# Patient Record
Sex: Male | Born: 1945 | Race: White | Hispanic: No | Smoking: Current every day smoker
Health system: Southern US, Community
[De-identification: ages and names within clinical notes are randomized; demographics above are authoritative.]

## PROBLEM LIST (undated history)

## (undated) DIAGNOSIS — E785 Hyperlipidemia, unspecified: Secondary | ICD-10-CM

## (undated) DIAGNOSIS — Z720 Tobacco use: Secondary | ICD-10-CM

## (undated) DIAGNOSIS — K219 Gastro-esophageal reflux disease without esophagitis: Secondary | ICD-10-CM

## (undated) DIAGNOSIS — R0600 Dyspnea, unspecified: Secondary | ICD-10-CM

## (undated) DIAGNOSIS — F4310 Post-traumatic stress disorder, unspecified: Secondary | ICD-10-CM

## (undated) DIAGNOSIS — R0602 Shortness of breath: Secondary | ICD-10-CM

## (undated) DIAGNOSIS — R011 Cardiac murmur, unspecified: Secondary | ICD-10-CM

## (undated) DIAGNOSIS — M1990 Unspecified osteoarthritis, unspecified site: Secondary | ICD-10-CM

## (undated) DIAGNOSIS — G8929 Other chronic pain: Secondary | ICD-10-CM

## (undated) DIAGNOSIS — J449 Chronic obstructive pulmonary disease, unspecified: Secondary | ICD-10-CM

## (undated) DIAGNOSIS — Z951 Presence of aortocoronary bypass graft: Secondary | ICD-10-CM

## (undated) DIAGNOSIS — I10 Essential (primary) hypertension: Secondary | ICD-10-CM

## (undated) DIAGNOSIS — Z953 Presence of xenogenic heart valve: Secondary | ICD-10-CM

## (undated) DIAGNOSIS — I700 Atherosclerosis of aorta: Secondary | ICD-10-CM

## (undated) DIAGNOSIS — D649 Anemia, unspecified: Secondary | ICD-10-CM

## (undated) HISTORY — PX: SHOULDER ARTHROSCOPY: SHX128

## (undated) HISTORY — DX: Atherosclerosis of aorta: I70.0

## (undated) HISTORY — PX: HERNIA REPAIR: SHX51

## (undated) HISTORY — DX: Tobacco use: Z72.0

## (undated) HISTORY — DX: Hyperlipidemia, unspecified: E78.5

## (undated) HISTORY — DX: Post-traumatic stress disorder, unspecified: F43.10

## (undated) HISTORY — DX: Other chronic pain: G89.29

## (undated) HISTORY — PX: ADENOIDECTOMY: SUR15

## (undated) HISTORY — PX: TONSILLECTOMY: SUR1361

## (undated) HISTORY — DX: Shortness of breath: R06.02

## (undated) HISTORY — PX: OTHER SURGICAL HISTORY: SHX169

## (undated) HISTORY — DX: Cardiac murmur, unspecified: R01.1

---

## 2008-03-18 ENCOUNTER — Ambulatory Visit (HOSPITAL_COMMUNITY): Admission: RE | Admit: 2008-03-18 | Discharge: 2008-03-18 | Payer: Self-pay | Admitting: Neurology

## 2016-02-13 DIAGNOSIS — E785 Hyperlipidemia, unspecified: Secondary | ICD-10-CM | POA: Diagnosis not present

## 2016-02-13 DIAGNOSIS — R011 Cardiac murmur, unspecified: Secondary | ICD-10-CM | POA: Diagnosis not present

## 2016-02-13 DIAGNOSIS — D649 Anemia, unspecified: Secondary | ICD-10-CM | POA: Diagnosis not present

## 2016-02-13 DIAGNOSIS — R0602 Shortness of breath: Secondary | ICD-10-CM | POA: Diagnosis not present

## 2016-02-13 DIAGNOSIS — Z720 Tobacco use: Secondary | ICD-10-CM | POA: Diagnosis not present

## 2016-03-14 ENCOUNTER — Other Ambulatory Visit: Payer: Self-pay | Admitting: Physician Assistant

## 2016-03-14 DIAGNOSIS — D509 Iron deficiency anemia, unspecified: Secondary | ICD-10-CM | POA: Diagnosis not present

## 2016-03-14 DIAGNOSIS — R1032 Left lower quadrant pain: Secondary | ICD-10-CM

## 2016-03-14 DIAGNOSIS — K5903 Drug induced constipation: Secondary | ICD-10-CM

## 2016-03-14 DIAGNOSIS — Z1211 Encounter for screening for malignant neoplasm of colon: Secondary | ICD-10-CM | POA: Diagnosis not present

## 2016-03-18 ENCOUNTER — Emergency Department (HOSPITAL_COMMUNITY): Payer: Commercial Managed Care - HMO

## 2016-03-18 ENCOUNTER — Encounter (HOSPITAL_COMMUNITY): Payer: Self-pay | Admitting: Emergency Medicine

## 2016-03-18 ENCOUNTER — Emergency Department (HOSPITAL_COMMUNITY)
Admission: EM | Admit: 2016-03-18 | Discharge: 2016-03-18 | Disposition: A | Discharge status: Home / Self Care | Payer: Commercial Managed Care - HMO | Attending: Emergency Medicine | Admitting: Emergency Medicine

## 2016-03-18 DIAGNOSIS — F17200 Nicotine dependence, unspecified, uncomplicated: Secondary | ICD-10-CM | POA: Insufficient documentation

## 2016-03-18 DIAGNOSIS — Z880 Allergy status to penicillin: Secondary | ICD-10-CM | POA: Diagnosis not present

## 2016-03-18 DIAGNOSIS — R231 Pallor: Secondary | ICD-10-CM | POA: Insufficient documentation

## 2016-03-18 DIAGNOSIS — R0602 Shortness of breath: Secondary | ICD-10-CM | POA: Diagnosis not present

## 2016-03-18 DIAGNOSIS — D649 Anemia, unspecified: Secondary | ICD-10-CM | POA: Diagnosis not present

## 2016-03-18 LAB — CBC
HCT: 24.9 % — ABNORMAL LOW (ref 39.0–52.0)
Hemoglobin: 7.1 g/dL — ABNORMAL LOW (ref 13.0–17.0)
MCH: 20.5 pg — AB (ref 26.0–34.0)
MCHC: 28.5 g/dL — ABNORMAL LOW (ref 30.0–36.0)
MCV: 72 fL — ABNORMAL LOW (ref 78.0–100.0)
PLATELETS: 216 10*3/uL (ref 150–400)
RBC: 3.46 MIL/uL — AB (ref 4.22–5.81)
RDW: 18 % — ABNORMAL HIGH (ref 11.5–15.5)
WBC: 4.7 10*3/uL (ref 4.0–10.5)

## 2016-03-18 LAB — BASIC METABOLIC PANEL
ANION GAP: 13 (ref 5–15)
BUN: <5 mg/dL — ABNORMAL LOW (ref 6–20)
CALCIUM: 9.2 mg/dL (ref 8.9–10.3)
CO2: 24 mmol/L (ref 22–32)
CREATININE: 0.99 mg/dL (ref 0.61–1.24)
Chloride: 106 mmol/L (ref 101–111)
Glucose, Bld: 105 mg/dL — ABNORMAL HIGH (ref 65–99)
Potassium: 3.9 mmol/L (ref 3.5–5.1)
SODIUM: 143 mmol/L (ref 135–145)

## 2016-03-18 LAB — I-STAT TROPONIN, ED: TROPONIN I, POC: 0.01 ng/mL (ref 0.00–0.08)

## 2016-03-18 NOTE — ED Notes (Signed)
Patient alert and oriented at discharge, VS stable.  Patient ambulatory with stable gait.  Patient verbalized understanding of the high importance to follow up with PCP today due to low Hbg.

## 2016-03-18 NOTE — ED Provider Notes (Signed)
CSN: [id number]     Arrival date & time 03/18/16  1030 History   First MD Initiated Contact with Patient 03/18/16 1241     Chief Complaint  Patient presents with  . Shortness of Breath     (Consider location/radiation/quality/duration/timing/severity/associated sxs/prior Treatment) Patient is a 70 y.o. male presenting with shortness of breath.  Shortness of Breath Severity:  Moderate Onset quality:  Sudden Duration:  2 days Timing:  Constant Progression:  Worsening Chronicity:  New Relieved by:  Nothing Worsened by:  Nothing tried Ineffective treatments:  None tried Associated symptoms: no abdominal pain, no chest pain, no fever, no headaches, no rash and no vomiting     70 yo M with a chief complaint of weakness and shortness of breath. This been going on for at least the past couple weeks. Patient has seen his family physician for this. He was diagnosed at that time with anemia was started on iron supplementation. Patient is also had scheduled follow-up with gastroenterology for possible colon cancer. Patient had a Hemoccult that was negative at that time. Has a CT scan as well as an upper GI series later this week. Patient denies any dark stools or blood in stool. Has had some mild stomach bloating. Had an episode last night for he was standing on a ladder and had sudden worsening shortness of breath and had to lay down on the ground and rest for about 5 minutes.  History reviewed. No pertinent past medical history. History reviewed. No pertinent past surgical history. History reviewed. No pertinent family history. Social History  Substance Use Topics  . Smoking status: Current Every Day Smoker  . Smokeless tobacco: None  . Alcohol Use: No    Review of Systems  Constitutional: Negative for fever and chills.  HENT: Negative for congestion and facial swelling.   Eyes: Negative for discharge and visual disturbance.  Respiratory: Positive for shortness of breath.    Cardiovascular: Negative for chest pain and palpitations.  Gastrointestinal: Negative for vomiting, abdominal pain and diarrhea.  Musculoskeletal: Negative for myalgias and arthralgias.  Skin: Negative for color change and rash.  Neurological: Negative for tremors, syncope and headaches.  Psychiatric/Behavioral: Negative for confusion and dysphoric mood.      Allergies  Penicillins  Home Medications   Prior to Admission medications   Not on File   BP 164/70 mmHg  Pulse 72  Temp(Src) 98.4 F (36.9 C) (Oral)  Resp 16  Ht  (1.803 m)  Wt 162 lb (73.483 kg)  BMI 22.60 kg/m2  SpO2 98% Physical Exam  Constitutional: He is oriented to person, place, and time. He appears well-developed and well-nourished.  Marked pallor   HENT:  Head: Normocephalic and atraumatic.  Eyes: EOM are normal. Pupils are equal, round, and reactive to light.  Neck: Normal range of motion. Neck supple. No JVD present.  Cardiovascular: Normal rate and regular rhythm.  Exam reveals no gallop and no friction rub.   No murmur heard. Pulmonary/Chest: No respiratory distress. He has no wheezes.  Abdominal: He exhibits no distension. There is no tenderness. There is no rebound and no guarding.  Musculoskeletal: Normal range of motion.  Neurological: He is alert and oriented to person, place, and time.  Skin: No rash noted. No pallor.  Psychiatric: He has a normal mood and affect. His behavior is normal.  Nursing note and vitals reviewed.   ED Course  Procedures (including critical care time) Labs Review Labs Reviewed  BASIC METABOLIC PANEL - Abnormal; Notable  for the following:    Glucose, Bld 105 (*)    BUN <5 (*)    All other components within normal limits  CBC - Abnormal; Notable for the following:    RBC 3.46 (*)    Hemoglobin 7.1 (*)    HCT 24.9 (*)    MCV 72.0 (*)    MCH 20.5 (*)    MCHC 28.5 (*)    RDW 18.0 (*)    All other components within normal limits  I-STAT TROPOININ, ED     Imaging Review Dg Chest 2 View  03/18/2016  CLINICAL DATA:  Shortness of breath, weakness, arm and jaw pain beginning yesterday. Initial encounter. EXAM: CHEST  2 VIEW COMPARISON:  None. FINDINGS: The lungs are clear. Heart size is normal. No pneumothorax or pleural effusion. No focal bony abnormality. IMPRESSION: Negative chest. Electronically Signed   By: Drusilla Kannerhomas  Dalessio M.D.   On: 03/18/2016 11:21   I have personally reviewed and evaluated these images and lab results as part of my medical decision-making.   EKG Interpretation   Date/Time:  Monday March 18 2016 10:39:02 EDT Ventricular Rate:  75 PR Interval:  134 QRS Duration: 88 QT Interval:  396 QTC Calculation: 442 R Axis:   70 Text Interpretation:  Normal sinus rhythm Nonspecific ST abnormality  Abnormal ECG Sinus rhythm ST-t wave abnormality Abnormal ekg Confirmed by  Gerhard MunchLOCKWOOD, ROBERT  MD ([id number]) on 03/18/2016 10:44:51 AM      MDM   Final diagnoses:  Symptomatic anemia    70 yo M with a chief complaint of weakness. Found to have significant anemia. Discussed with patient that the normal course for this as admission with transfusion of blood products. The patient feels that he would rather continue take iron supplementation and see if this corrects on its own. Discussed with the patient is could take many months. And then he would continue to have symptoms from this. Patient states that he has been recently anemic but is unable to give me an exact value. Refused rectal exam. Again discussed with patient that he likely will need admission with transfusion and suggested that he call his family doctor immediately upon discharge.   1:50 PM:  I have discussed the diagnosis/risks/treatment options with the patient and believe the pt to be eligible for discharge home to follow-up with PCP/GI. We also discussed returning to the ED immediately if new or worsening sx occur. We discussed the sx which are most concerning (e.g., syncope,  sudden worsening chest pain, sob) that necessitate immediate return. Medications administered to the patient during their visit and any new prescriptions provided to the patient are listed below.  Medications given during this visit Medications - No data to display  New Prescriptions   No medications on file    The patient appears reasonably screen and/or stabilized for discharge and I doubt any other medical condition or other [facility] requiring further screening, evaluation, or treatment in the ED at this time prior to discharge.      Melene Planan Wing Gfeller, DO 03/18/16 1351

## 2016-03-18 NOTE — Discharge Instructions (Signed)
Talk to your family doc.  If your hemoglobin has dropped significantly there is a good chance he will ask you to return to be admitted.  Return for any worsening symptoms or passing out.   Anemia, Nonspecific Anemia is a condition in which the concentration of red blood cells or hemoglobin in the blood is below normal. Hemoglobin is a substance in red blood cells that carries oxygen to the tissues of the body. Anemia results in not enough oxygen reaching these tissues.  CAUSES  Common causes of anemia include:   Excessive bleeding. Bleeding may be internal or external. This includes excessive bleeding from periods (in women) or from the intestine.   Poor nutrition.   Chronic kidney, thyroid, and liver disease.  Bone marrow disorders that decrease red blood cell production.  Cancer and treatments for cancer.  HIV, AIDS, and their treatments.  Spleen problems that increase red blood cell destruction.  Blood disorders.  Excess destruction of red blood cells due to infection, medicines, and autoimmune disorders. SIGNS AND SYMPTOMS   Minor weakness.   Dizziness.   Headache.  Palpitations.   Shortness of breath, especially with exercise.   Paleness.  Cold sensitivity.  Indigestion.  Nausea.  Difficulty sleeping.  Difficulty concentrating. Symptoms may occur suddenly or they may develop slowly.  DIAGNOSIS  Additional blood tests are often needed. These help your health care provider determine the best treatment. Your health care provider will check your stool for blood and look for other causes of blood loss.  TREATMENT  Treatment varies depending on the cause of the anemia. Treatment can include:   Supplements of iron, vitamin B12, or folic acid.   Hormone medicines.   A blood transfusion. This may be needed if blood loss is severe.   Hospitalization. This may be needed if there is significant continual blood loss.   Dietary changes.  Spleen  removal. HOME CARE INSTRUCTIONS Keep all follow-up appointments. It often takes many weeks to correct anemia, and having your health care provider check on your condition and your response to treatment is very important. SEEK IMMEDIATE MEDICAL CARE IF:   You develop extreme weakness, shortness of breath, or chest pain.   You become dizzy or have trouble concentrating.  You develop heavy vaginal bleeding.   You develop a rash.   You have bloody or black, tarry stools.   You faint.   You vomit up blood.   You vomit repeatedly.   You have abdominal pain.  You have a fever or persistent symptoms for more than 2-3 days.   You have a fever and your symptoms suddenly get worse.   You are dehydrated.  MAKE SURE YOU:  Understand these instructions.  Will watch your condition.  Will get help right away if you are not doing well or get worse.   This information is not intended to replace advice given to you by your health care provider. Make sure you discuss any questions you have with your health care provider.   Document Released: 01/23/2005 Document Revised: 08/18/2013 Document Reviewed: 06/11/2013 Elsevier Interactive Patient Education [employer]2016 [employer] Inc.

## 2016-03-18 NOTE — ED Notes (Signed)
Pt sts SOB and some leg weakness starting yesterday; pt sts some cough; pt sts feels worse with exertion

## 2016-03-19 ENCOUNTER — Encounter (HOSPITAL_COMMUNITY): Payer: Self-pay | Admitting: Neurology

## 2016-03-19 ENCOUNTER — Observation Stay (HOSPITAL_COMMUNITY): Payer: Commercial Managed Care - HMO

## 2016-03-19 ENCOUNTER — Observation Stay (HOSPITAL_COMMUNITY)
Admission: EM | Admit: 2016-03-19 | Discharge: 2016-03-20 | Disposition: A | Discharge status: Home / Self Care | Payer: Commercial Managed Care - HMO | Attending: Internal Medicine | Admitting: Internal Medicine

## 2016-03-19 DIAGNOSIS — R079 Chest pain, unspecified: Secondary | ICD-10-CM | POA: Diagnosis not present

## 2016-03-19 DIAGNOSIS — Z79899 Other long term (current) drug therapy: Secondary | ICD-10-CM | POA: Diagnosis not present

## 2016-03-19 DIAGNOSIS — N200 Calculus of kidney: Secondary | ICD-10-CM | POA: Diagnosis not present

## 2016-03-19 DIAGNOSIS — R030 Elevated blood-pressure reading, without diagnosis of hypertension: Secondary | ICD-10-CM | POA: Insufficient documentation

## 2016-03-19 DIAGNOSIS — K5909 Other constipation: Secondary | ICD-10-CM | POA: Insufficient documentation

## 2016-03-19 DIAGNOSIS — R1030 Lower abdominal pain, unspecified: Secondary | ICD-10-CM

## 2016-03-19 DIAGNOSIS — F17210 Nicotine dependence, cigarettes, uncomplicated: Secondary | ICD-10-CM | POA: Diagnosis not present

## 2016-03-19 DIAGNOSIS — D649 Anemia, unspecified: Secondary | ICD-10-CM | POA: Diagnosis not present

## 2016-03-19 DIAGNOSIS — R0602 Shortness of breath: Secondary | ICD-10-CM

## 2016-03-19 DIAGNOSIS — R109 Unspecified abdominal pain: Secondary | ICD-10-CM

## 2016-03-19 DIAGNOSIS — R1032 Left lower quadrant pain: Secondary | ICD-10-CM | POA: Diagnosis not present

## 2016-03-19 DIAGNOSIS — I70201 Unspecified atherosclerosis of native arteries of extremities, right leg: Secondary | ICD-10-CM | POA: Insufficient documentation

## 2016-03-19 DIAGNOSIS — I700 Atherosclerosis of aorta: Secondary | ICD-10-CM | POA: Insufficient documentation

## 2016-03-19 DIAGNOSIS — R531 Weakness: Secondary | ICD-10-CM | POA: Diagnosis not present

## 2016-03-19 DIAGNOSIS — D509 Iron deficiency anemia, unspecified: Secondary | ICD-10-CM | POA: Insufficient documentation

## 2016-03-19 LAB — CBC
HCT: 24.2 % — ABNORMAL LOW (ref 39.0–52.0)
Hemoglobin: 6.9 g/dL — CL (ref 13.0–17.0)
MCH: 20.8 pg — AB (ref 26.0–34.0)
MCHC: 28.5 g/dL — AB (ref 30.0–36.0)
MCV: 72.9 fL — ABNORMAL LOW (ref 78.0–100.0)
PLATELETS: 222 10*3/uL (ref 150–400)
RBC: 3.32 MIL/uL — ABNORMAL LOW (ref 4.22–5.81)
RDW: 18.9 % — ABNORMAL HIGH (ref 11.5–15.5)
WBC: 5 10*3/uL (ref 4.0–10.5)

## 2016-03-19 LAB — COMPREHENSIVE METABOLIC PANEL
ALBUMIN: 3.7 g/dL (ref 3.5–5.0)
ALK PHOS: 80 U/L (ref 38–126)
ALT: 17 U/L (ref 17–63)
AST: 23 U/L (ref 15–41)
Anion gap: 10 (ref 5–15)
BUN: <5 mg/dL — ABNORMAL LOW (ref 6–20)
CALCIUM: 9.1 mg/dL (ref 8.9–10.3)
CO2: 25 mmol/L (ref 22–32)
CREATININE: 0.99 mg/dL (ref 0.61–1.24)
Chloride: 109 mmol/L (ref 101–111)
GFR calc Af Amer: >60 mL/min (ref >60)
GFR calc non Af Amer: >60 mL/min (ref >60)
GLUCOSE: 108 mg/dL — AB (ref 65–99)
Potassium: 4.1 mmol/L (ref 3.5–5.1)
SODIUM: 144 mmol/L (ref 135–145)
Total Bilirubin: 0.4 mg/dL (ref 0.3–1.2)
Total Protein: 6.3 g/dL — ABNORMAL LOW (ref 6.5–8.1)

## 2016-03-19 LAB — ABO/RH: ABO/RH(D): O POS

## 2016-03-19 LAB — PREPARE RBC (CROSSMATCH)

## 2016-03-19 LAB — POC OCCULT BLOOD, ED: Fecal Occult Bld: NEGATIVE

## 2016-03-19 MED ORDER — SODIUM CHLORIDE 0.9 % IV SOLN: INTRAVENOUS | Status: DC

## 2016-03-19 MED ORDER — IOHEXOL 300 MG/ML  SOLN
100.0000 mL | Freq: Once | INTRAMUSCULAR | Status: AC | PRN
  Administered 2016-03-19: 100 mL via INTRAVENOUS

## 2016-03-19 MED ORDER — HYDROCODONE-ACETAMINOPHEN 5-325 MG PO TABS: 1.0000 | ORAL_TABLET | Freq: Four times a day (QID) | ORAL | Status: DC | PRN

## 2016-03-19 MED ORDER — SODIUM CHLORIDE 0.9 % IV SOLN: Freq: Once | INTRAVENOUS | Status: DC

## 2016-03-19 MED ORDER — MORPHINE SULFATE (PF) 2 MG/ML IV SOLN: 2.0000 mg | INTRAVENOUS | Status: DC | PRN

## 2016-03-19 NOTE — ED Provider Notes (Signed)
Jonathan Holland: [id number]     Arrival date & time 03/19/16  1252 History   First MD Initiated Contact with Patient 03/19/16 1518     Chief Complaint  Patient presents with  . Abnormal Lab     (Consider location/radiation/quality/duration/timing/severity/associated sxs/prior Treatment) HPI   Jonathan Holland is a 70 y.o. male, patient with no pertinent past medical history, presenting to the ED with intermittent shortness of breath, chest pain, and weakness for the last three days. These symptoms are only present upon exertion. Pt also endorses intermittent hematochezia and LLQ abdominal pain for the last three weeks. Pt currently denies pain. Patient states that he was seen yesterday for the same complaints, was told that he would need to be admitted for blood transfusion, but that he refused at that time. Patient states that he wanted to talk with his PCP about this issue prior to admission, but when his PCP would not call him back, patient decided to return to the ED for treatment. Patient denies fever/chills, nausea/vomiting, LOC, current chest pain or shortness of breath, hematemesis or hemoptysis, weight loss, night sweats, or any other complaints.    History reviewed. No pertinent past medical history. History reviewed. No pertinent past surgical history. No family history on file. Social History  Substance Use Topics  . Smoking status: Current Every Day Smoker  . Smokeless tobacco: None  . Alcohol Use: No    Review of Systems  Constitutional: Negative for fever and chills.  Respiratory: Positive for shortness of breath (exertional).   Cardiovascular: Positive for chest pain (exertional).  Gastrointestinal: Positive for abdominal pain (intermittent, currently resolved) and blood in stool. Negative for nausea, vomiting, diarrhea and constipation.  Genitourinary: Negative for dysuria.  Skin: Positive for pallor.  Neurological: Positive for weakness. Negative for dizziness, syncope,  light-headedness and headaches.  All other systems reviewed and are negative.     Allergies  Penicillins  Home Medications   Prior to Admission medications   Medication Sig Start Date End Date Taking? Authorizing Provider  Aspirin-Salicylamide-Caffeine (BC HEADACHE POWDER PO) Take 1 packet by mouth every 8 (eight) hours as needed (pain).   Yes Historical Provider, MD  ferrous sulfate 325 (65 FE) MG tablet Take 325 mg by mouth daily with breakfast.   Yes Historical Provider, MD  HYDROcodone-acetaminophen (NORCO/VICODIN) 5-325 MG tablet Take 1 tablet by mouth every 6 (six) hours as needed for moderate pain.   Yes Historical Provider, MD  Multiple Vitamin (MULTIVITAMIN) tablet Take 1 tablet by mouth daily.   Yes Historical Provider, MD  oxyCODONE-acetaminophen (PERCOCET) 7.5-325 MG tablet Take 1 tablet by mouth every 4 (four) hours as needed for moderate pain or severe pain.   Yes Historical Provider, MD  Sennosides (SENNA) 8.6 MG CAPS Take 8.6 mg by mouth daily as needed (constipation).   Yes Historical Provider, MD   BP 153/75 mmHg  Pulse 87  Temp(Src) 98.4 F (36.9 C) (Oral)  Resp 12  Ht  (1.803 m)  Wt 76.93 kg  BMI 23.66 kg/m2  SpO2 100% Physical Exam  Constitutional: He is oriented to person, place, and time. He appears well-developed and well-nourished. No distress.  HENT:  Head: Normocephalic and atraumatic.  Eyes: Conjunctivae and EOM are normal. Pupils are equal, round, and reactive to light.  Neck: Neck supple.  Cardiovascular: Normal rate, regular rhythm, normal heart sounds and intact distal pulses.   Pulmonary/Chest: Effort normal and breath sounds normal. No respiratory distress.  Abdominal: Soft. Bowel sounds are normal. There is  no tenderness. There is no guarding.  Genitourinary: Guaiac negative stool.  No gross blood, melena, or stool in the rectal vault. Prostate is nontender. Caryn BeeKevin, RN, as chaperone.  Musculoskeletal: He exhibits no edema or tenderness.   Lymphadenopathy:    He has no cervical adenopathy.  Neurological: He is alert and oriented to person, place, and time. He has normal reflexes.  No sensory deficits. Strength 5/5 in all extremities. No gait disturbance. Coordination intact. Cranial nerves III-XII grossly intact. No facial droop.   Skin: Skin is warm and dry. He is not diaphoretic. There is pallor.  Psychiatric: He has a normal mood and affect. His behavior is normal.  Nursing note and vitals reviewed.   ED Course  Procedures (including critical care time)  CRITICAL CARE Performed by: Nesanel Aguila C Shinika Estelle Total critical care time: 30 minutes Critical care time was exclusive of separately billable procedures and treating other patients. Critical care was necessary to treat or prevent imminent or life-threatening deterioration. Critical care was time spent personally by me on the following activities: development of treatment plan with patient and/or surrogate as well as nursing, discussions with consultants, evaluation of patient's response to treatment, examination of patient, obtaining history from patient or surrogate, ordering and performing treatments and interventions, ordering and review of laboratory studies, ordering and review of radiographic studies, pulse oximetry and re-evaluation of patient's condition.  Labs Review Labs Reviewed  COMPREHENSIVE METABOLIC PANEL - Abnormal; Notable for the following:    Glucose, Bld 108 (*)    BUN <5 (*)    Total Protein 6.3 (*)    All other components within normal limits  CBC - Abnormal; Notable for the following:    RBC 3.32 (*)    Hemoglobin 6.9 (*)    HCT 24.2 (*)    MCV 72.9 (*)    MCH 20.8 (*)    MCHC 28.5 (*)    RDW 18.9 (*)    All other components within normal limits  COMPREHENSIVE METABOLIC PANEL  CBC  IRON AND TIBC  HAPTOGLOBIN  RETICULOCYTES  POC OCCULT BLOOD, ED  TYPE AND SCREEN  ABO/RH  PREPARE RBC (CROSSMATCH)    Imaging Review Dg Chest 2  View  03/18/2016  CLINICAL DATA:  Shortness of breath, weakness, arm and jaw pain beginning yesterday. Initial encounter. EXAM: CHEST  2 VIEW COMPARISON:  None. FINDINGS: The lungs are clear. Heart size is normal. No pneumothorax or pleural effusion. No focal bony abnormality. IMPRESSION: Negative chest. Electronically Signed   By: Drusilla Kannerhomas  Dalessio M.D.   On: 03/18/2016 11:21   Ct Abdomen Pelvis W Contrast  03/19/2016  CLINICAL DATA:  Left lower quadrant pain EXAM: CT ABDOMEN AND PELVIS WITH CONTRAST TECHNIQUE: Multidetector CT imaging of the abdomen and pelvis was performed using the standard protocol following bolus administration of intravenous contrast. CONTRAST:  100mL OMNIPAQUE IOHEXOL 300 MG/ML  SOLN COMPARISON:  10/29/2011 FINDINGS: Diffuse hepatic steatosis Gallbladder, spleen, pancreas, and adrenal glands are within normal limits Bilateral nephrolithiasis is present. Largest stone on the left is in the lower pole measures 8.5 mm. No hydronephrosis. No definite ureteral calculus. Bladder and prostate are within normal limits. Sigmoid colon is unremarkable. No evidence of bowel obstruction. Appendix is nonvisualized. No free-fluid. No abnormal retroperitoneal adenopathy small left para-aortic nodes are noted. There is extensive irregular plaque throughout the infrarenal aorta. There is also calcified plaque at the origin of the SMA. Exact narrowing cannot be made on this study. Celiac is grossly patent. IMA is grossly patent.  Atherosclerotic changes of the iliac arteries are present. There is no obvious focal severe narrowing or occlusion. There is extensive plaque within the right common femoral artery. No vertebral compression deformity. IMPRESSION: Bilateral nephrolithiasis. Extensive atherosclerotic changes. Significant narrowing at the origin of the SMA cannot be excluded by this study. Consider CT angiogram of the abdomen if mesenteric ischemia is suspected. Specifically, if there is a history of  postprandial pain and weight loss Electronically Signed   By: Jolaine Click M.D.   On: 03/19/2016 19:20   I have personally reviewed and evaluated these images and lab results as part of my medical decision-making.   EKG Interpretation None       MDM   Final diagnoses:  Symptomatic anemia  Shortness of breath  Chest pain, unspecified chest pain type  Weakness    Jonathan Holland presents with shortness of breath, chest pain, and weakness, all on exertion for the last 3-4 days.  Findings and plan of care discussed with Lyndal Pulley, MD. Dr. Clydene Pugh personally evaluated and examined this patient.  This patient has symptomatic anemia and will require multiple blood transfusions. Risks, benefits, and indications of blood transfusion communicated with the patient. Patient voiced understanding of information and agreed to the transfusion. The appropriate labs as well as the RBC transfusion were ordered. Upon reevaluation, patient continues to deny any symptoms while at rest, only with exertion. Blood transfusion has been started and no signs of transfusion reaction noted. 5:10 PM Dory Horn, MD from the Internal Medicine service who agreed to come evaluate the patient for admission. Dr. Suzzette Righter will input admission orders. Attending physician is Constance Holster. Dr. Suzzette Righter called back noting that the patient is assigned and thus will need to be admitted through [facility]. 5:51 PM Spoke with Dr. Rhona Leavens, Hospitalist, who agreed to admit the patient to Med-Surg for serial stool checks. Due to the patient's microcytic anemia combined with him never having had a colonoscopy, recommends getting GI involved. 6:40 PM Spoke with Dr. Bosie Clos, on call for GI, who stated that they would follow the patient inpatient.    Filed Vitals:   03/19/16 1800 03/19/16 1830 03/19/16 1915 03/19/16 1930  BP: 153/80 164/70 158/71 153/75  Pulse: 77 79 76 87  Temp:  98.3 F (36.8 C) 98.4 F (36.9 C) 98.4 F (36.9  C)  TempSrc:  Oral Oral Oral  Resp: Height:      Weight:      SpO2: 97% 100% 100% 100%       Anselm Pancoast, PA-C 03/19/16 2023  Lyndal Pulley, MD 03/20/16 [phone]

## 2016-03-19 NOTE — ED Notes (Signed)
Pt here for low hemoglobin of 7.1, was seen here yesterday and wanted to admit yesterday but didn't want to at this time. Now is here for transfusion, and thinks he saw some blood in his stool this morning. Pt is a x 4.

## 2016-03-19 NOTE — ED Notes (Signed)
In room as chaperone with Shawn for rectal exam.

## 2016-03-19 NOTE — ED Notes (Signed)
CT called and  Coming to transport pt to CT between transfusion

## 2016-03-19 NOTE — ED Notes (Signed)
Dr. Clydene PughKnott made aware of hemoglobin value.

## 2016-03-19 NOTE — H&P (Signed)
[facility] History and Physical  JUANITO GONYER ONG:[id number] DOB: 01-12-1946 DOA: 03/19/2016  Referring physician: Emergency Department PCP: No primary care provider on file.  Specialists:   Chief Complaint: Anemia  HPI: Jonathan Holland is a 70 y.o. male with no prior medical history who presented to the ED on 3/20 with complaints of sob, found to have hgb of 7.1 and microcytosis. Patient was offered admission, however declined and was instead advised to follow up with PCP for GI referral. Patient returns to ED on 3/21, now agreeable for admission. Repeat hgb noted to be 6.9 with MCV of 72. Pt did complain of LLQ abd pain. Denied BRBPR and frank melena. Stools were heme neg in Ed. Given concerns of microcytic anemia in a colonoscopy naive male, GI was consulted through ED. Hospitalist consulted for consideration for admission.  On further questioning, denies wt loss. Reports issues with constipation  Review of Systems:  Review of Systems  Constitutional: Positive for malaise/fatigue. Negative for fever and chills.  HENT: Negative for ear discharge, ear pain and tinnitus.   Eyes: Negative for double vision and pain.  Respiratory: Negative for sputum production and shortness of breath.   Cardiovascular: Negative for orthopnea and claudication.  Gastrointestinal: Positive for abdominal pain and constipation. Negative for blood in stool and melena.  Genitourinary: Negative for frequency and hematuria.  Musculoskeletal: Negative for back pain, joint pain and falls.  Neurological: Positive for weakness. Negative for tingling, tremors and loss of consciousness.  Psychiatric/Behavioral: Negative for hallucinations and memory loss.      History reviewed. No pertinent past medical history. History reviewed. No pertinent past surgical history. Social History:  reports that he has been smoking.  He does not have any smokeless tobacco history on file. He reports that he does not drink  alcohol or use illicit drugs. \ where does patient live--home, ALF, SNF? and with whom if at home?  Can patient participate in ADLs?  Allergies  Allergen Reactions  . Penicillins     No family history on file. Heart disease with father  Prior to Admission medications   Medication Sig Start Date End Date Taking? Authorizing Provider  Aspirin-Salicylamide-Caffeine (BC HEADACHE POWDER PO) Take 1 packet by mouth every 8 (eight) hours as needed (pain).   Yes Historical Provider, MD  ferrous sulfate 325 (65 FE) MG tablet Take 325 mg by mouth daily with breakfast.   Yes Historical Provider, MD  HYDROcodone-acetaminophen (NORCO/VICODIN) 5-325 MG tablet Take 1 tablet by mouth every 6 (six) hours as needed for moderate pain.   Yes Historical Provider, MD  Multiple Vitamin (MULTIVITAMIN) tablet Take 1 tablet by mouth daily.   Yes Historical Provider, MD  oxyCODONE-acetaminophen (PERCOCET) 7.5-325 MG tablet Take 1 tablet by mouth every 4 (four) hours as needed for moderate pain or severe pain.   Yes Historical Provider, MD  Sennosides (SENNA) 8.6 MG CAPS Take 8.6 mg by mouth daily as needed (constipation).   Yes Historical Provider, MD   Physical Exam: Filed Vitals:   03/19/16 1600 03/19/16 1605 03/19/16 1630 03/19/16 1641  BP: 169/75 169/75 165/74 139/78  Pulse:  81 70 75  Temp:  98.9 F (37.2 C)  98.4 F (36.9 C)  TempSrc:  Oral  Oral  Resp:  Height:      Weight:      SpO2:  100% 100% 99%     General:  Awake, in nad  Eyes: PERRL B  ENT: membranes moist, dentition fair  Neck: trachea midline, neck supple  Cardiovascular: regular, s1, s2  Respiratory: normal resp effort, no wheezing  Abdomen: soft, nondistended  Skin: normal skin turgor, skin appears pale  Musculoskeletal: perfused, no clubbing  Psychiatric: mood/affect normal//no auditory/visual hallucinations  Neurologic: cn2-12 grossly intact, strength/sensation intactg  Labs on Admission:  Basic Metabolic  Panel:  Recent Labs Lab 03/18/16 1055 03/19/16 1403  NA 143 144  K 3.9 4.1  CL 106 109  CO2 24 25  GLUCOSE 105* 108*  BUN <5* <5*  CREATININE 0.99 0.99  CALCIUM 9.2 9.1   Liver Function Tests:  Recent Labs Lab 03/19/16 1403  AST 23  ALT 17  ALKPHOS 80  BILITOT 0.4  PROT 6.3*  ALBUMIN 3.7   No results for input(s): LIPASE, AMYLASE in the last 168 hours. No results for input(s): AMMONIA in the last 168 hours. CBC:  Recent Labs Lab 03/18/16 1055 03/19/16 1403  WBC 4.7 5.0  HGB 7.1* 6.9*  HCT 24.9* 24.2*  MCV 72.0* 72.9*  PLT 216 222   Cardiac Enzymes: No results for input(s): CKTOTAL, CKMB, CKMBINDEX, TROPONINI in the last 168 hours.  BNP (last 3 results) No results for input(s): BNP in the last 8760 hours.  ProBNP (last 3 results) No results for input(s): PROBNP in the last 8760 hours.  CBG: No results for input(s): GLUCAP in the last 168 hours.  Radiological Exams on Admission: Dg Chest 2 View  03/18/2016  CLINICAL DATA:  Shortness of breath, weakness, arm and jaw pain beginning yesterday. Initial encounter. EXAM: CHEST  2 VIEW COMPARISON:  None. FINDINGS: The lungs are clear. Heart size is normal. No pneumothorax or pleural effusion. No focal bony abnormality. IMPRESSION: Negative chest. Electronically Signed   By: Drusilla Kannerhomas  Dalessio M.D.   On: 03/18/2016 11:21    Assessment/Plan Active Problems:   Anemia   1. Microcytic anemia 1. Patient is being transfused through the ED 2. Follow serial hgb 3. Will check iron studies, retic count, haptoglobin 4. Pt is colonoscopy naive 5. GI consulted through ED 6. Will check CT abd per below 7. Admit to med-surg 2. Abd pain 1. Will check abd CT 2. Stable at present 3. Cont analgesics as needed 3. Weakness 1. Likely secondary to transfusion dependent anemia 2. Transfuse per above 4. DVT prophylaxis 1. SCD's  Code Status: Full Family Communication: Pt in room Disposition Plan: [facility]   CHIU, STEPHEN K [facility] Pager [phone]  If 7PM-7AM, please contact night-coverage [email or web address] Password [facility] 03/19/2016, 5:56 PM

## 2016-03-19 NOTE — ED Provider Notes (Signed)
Medical screening examination/treatment/procedure(s) were conducted as a shared visit with non-physician practitioner(s) and myself.  I personally evaluated the patient during the encounter.   EKG Interpretation None     70 year old male presents with recurrent and increasing symptomatic anemia. Has level of 6.9 here on repeat which is down from previous. Rectal exam negative for occult blood. Appears pale but otherwise no acute distress. Transfuse in the emergency department for symptomatic anemia. Admitted to medicine to assess for response to therapy and monitor for transfusion reaction.  CRITICAL CARE Performed by: Lyndal PulleyKnott, Delmos Velaquez Total critical care time: 30 minutes Critical care time was exclusive of separately billable procedures and treating other patients. Critical care was necessary to treat or prevent imminent or life-threatening deterioration. Critical care was time spent personally by me on the following activities: development of treatment plan with patient and/or surrogate as well as nursing, discussions with consultants, evaluation of patient's response to treatment, examination of patient, obtaining history from patient or surrogate, ordering and performing treatments and interventions, ordering and review of laboratory studies, ordering and review of radiographic studies, pulse oximetry and re-evaluation of patient's condition.  See related encounter note   Lyndal Pulleyaniel Shreyan Hinz, MD 03/20/16 [id number]

## 2016-03-20 ENCOUNTER — Other Ambulatory Visit: Payer: Commercial Managed Care - HMO

## 2016-03-20 DIAGNOSIS — K5900 Constipation, unspecified: Secondary | ICD-10-CM | POA: Diagnosis not present

## 2016-03-20 DIAGNOSIS — D649 Anemia, unspecified: Secondary | ICD-10-CM | POA: Diagnosis not present

## 2016-03-20 DIAGNOSIS — R1084 Generalized abdominal pain: Secondary | ICD-10-CM | POA: Diagnosis not present

## 2016-03-20 LAB — COMPREHENSIVE METABOLIC PANEL
ALBUMIN: 3.6 g/dL (ref 3.5–5.0)
ALK PHOS: 75 U/L (ref 38–126)
ALT: 18 U/L (ref 17–63)
ANION GAP: 11 (ref 5–15)
AST: 24 U/L (ref 15–41)
BILIRUBIN TOTAL: 0.8 mg/dL (ref 0.3–1.2)
CALCIUM: 9.2 mg/dL (ref 8.9–10.3)
CO2: 25 mmol/L (ref 22–32)
Chloride: 107 mmol/L (ref 101–111)
Creatinine, Ser: 0.99 mg/dL (ref 0.61–1.24)
GFR calc Af Amer: >60 mL/min (ref >60)
GLUCOSE: 105 mg/dL — AB (ref 65–99)
Potassium: 3.6 mmol/L (ref 3.5–5.1)
Sodium: 143 mmol/L (ref 135–145)
TOTAL PROTEIN: 6.4 g/dL — AB (ref 6.5–8.1)

## 2016-03-20 LAB — RETICULOCYTES
RBC.: 4.26 MIL/uL (ref 4.22–5.81)
RETIC CT PCT: 3.8 % — AB (ref 0.4–3.1)
Retic Count, Absolute: 161.9 10*3/uL (ref 19.0–186.0)

## 2016-03-20 LAB — CBC
HCT: 31.3 % — ABNORMAL LOW (ref 39.0–52.0)
HEMOGLOBIN: 9.6 g/dL — AB (ref 13.0–17.0)
MCH: 22.5 pg — ABNORMAL LOW (ref 26.0–34.0)
MCHC: 30.7 g/dL (ref 30.0–36.0)
MCV: 73.5 fL — ABNORMAL LOW (ref 78.0–100.0)
Platelets: 181 10*3/uL (ref 150–400)
RBC: 4.26 MIL/uL (ref 4.22–5.81)
RDW: 19.2 % — AB (ref 11.5–15.5)
WBC: 5.9 10*3/uL (ref 4.0–10.5)

## 2016-03-20 LAB — IRON AND TIBC
IRON: 20 ug/dL — AB (ref 45–182)
Saturation Ratios: 5 % — ABNORMAL LOW (ref 17.9–39.5)
TIBC: 438 ug/dL (ref 250–450)
UIBC: 418 ug/dL

## 2016-03-20 MED ORDER — ONDANSETRON HCL 4 MG/2ML IJ SOLN: 4.0000 mg | Freq: Three times a day (TID) | INTRAMUSCULAR | Status: DC | PRN

## 2016-03-20 NOTE — Care Management Obs Status (Addendum)
MEDICARE OBSERVATION STATUS NOTIFICATION   Patient Details  Name: Jonathan HurryJohnny W Cafarella MRN: [id number] Date of Birth: [birth year]   Medicare Observation Status Notification Given:  Yes  CC44 signed by patient, short form placed in chart  Lawerance SabalDebbie Joron Velis, RN 03/20/2016, 11:21 AM

## 2016-03-20 NOTE — Progress Notes (Signed)
NURSING PROGRESS NOTE  Jonathan Holland [id number] Discharge Data: 03/20/2016 11:01 AM Attending Provider: Osvaldo ShipperGokul Krishnan, MD EXB:[id number]:MORROW, [name], MD     Jonathan HurryJohnny W Holland to be D/C'd home per MD order.  Discussed with the patient the After Visit Summary and all questions fully answered. All IV's discontinued with no bleeding noted. All belongings returned to patient for patient to take home.   Last Vital Signs:  Blood pressure 156/68, pulse 65, temperature 98.2 F (36.8 C), temperature source Oral, resp. rate 18, height 5\' 11"  (1.803 m), weight 76.93 kg (169 lb 9.6 oz), SpO2 100 %.  Discharge Medication List   Medication List    STOP taking these medications        BC HEADACHE POWDER PO     oxyCODONE-acetaminophen 7.5-325 MG tablet  Commonly known as:  PERCOCET      TAKE these medications        ferrous sulfate 325 (65 FE) MG tablet  Take 325 mg by mouth daily with breakfast.     HYDROcodone-acetaminophen 5-325 MG tablet  Commonly known as:  NORCO/VICODIN  Take 1 tablet by mouth every 6 (six) hours as needed for moderate pain.     multivitamin tablet  Take 1 tablet by mouth daily.     Senna 8.6 MG Caps  Take 8.6 mg by mouth daily as needed (constipation).

## 2016-03-20 NOTE — Consult Note (Signed)
[facility] Consultation Note  Referring Provider:  Dr. Rickey Barbara [facility]) Primary Care Physician:  No primary care provider on file.  Reason for Consultation:  Iron-deficiency anemia  HPI: Jonathan Holland is a 70 y.o. male admitted for progressive shortness of breath.  Was found to be anemic.  Was transfused blood and now is asymptomatic.  He has no nausea, vomiting, dysphagia.  Has chronic 20+ years of constipation, responsive to Senokot.  No overt blood in stool.  No weight loss.  No prior endoscopy or colonoscopy, and no family history of colon cancer or polyps.  Has some intermittent left-sided abdominal pain, without clear alleviating or exacerbating factors.   History reviewed. No pertinent past medical history.  History reviewed. No pertinent past surgical history.  Prior to Admission medications   Medication Sig Start Date End Date Taking? Authorizing Provider  Aspirin-Salicylamide-Caffeine (BC HEADACHE POWDER PO) Take 1 packet by mouth every 8 (eight) hours as needed (pain).   Yes Historical Provider, MD  ferrous sulfate 325 (65 FE) MG tablet Take 325 mg by mouth daily with breakfast.   Yes Historical Provider, MD  HYDROcodone-acetaminophen (NORCO/VICODIN) 5-325 MG tablet Take 1 tablet by mouth every 6 (six) hours as needed for moderate pain.   Yes Historical Provider, MD  Multiple Vitamin (MULTIVITAMIN) tablet Take 1 tablet by mouth daily.   Yes Historical Provider, MD  oxyCODONE-acetaminophen (PERCOCET) 7.5-325 MG tablet Take 1 tablet by mouth every 4 (four) hours as needed for moderate pain or severe pain.   Yes Historical Provider, MD  Sennosides (SENNA) 8.6 MG CAPS Take 8.6 mg by mouth daily as needed (constipation).   Yes Historical Provider, MD    Current Facility-Administered Medications  Medication Dose Route Frequency Provider Last Rate Last Dose  . 0.9 %  sodium chloride infusion   Intravenous Once Shawn C Joy, PA-C      . 0.9 %  sodium chloride infusion    Intravenous Continuous Jerald Kief, MD      . HYDROcodone-acetaminophen (NORCO/VICODIN) 5-325 MG per tablet 1 tablet  1 tablet Oral Q6H PRN Jerald Kief, MD      . morphine 2 MG/ML injection 2 mg  2 mg Intravenous Q4H PRN Jerald Kief, MD      . ondansetron [facility]) injection 4 mg  4 mg Intravenous Q8H PRN Shawn C Joy, PA-C        Allergies as of 03/19/2016 - Review Complete 03/19/2016  Allergen Reaction Noted  . Penicillins  03/18/2016    No family history on file.  Social History   Social History  . Marital Status: Married    Spouse Name: N/A  . Number of Children: N/A  . Years of Education: N/A   Occupational History  . Not on file.   Social History Main Topics  . Smoking status: Current Every Day Smoker  . Smokeless tobacco: Not on file  . Alcohol Use: No  . Drug Use: No  . Sexual Activity: Not on file   Other Topics Concern  . Not on file   Social History Narrative    Review of Systems: ROS Dr. Rhona Leavens 03/19/16 reviewed and I agree  Physical Exam: Vital signs in last 24 hours: Temp:  [98.2 F (36.8 C)-98.9 F (37.2 C)] 98.2 F (36.8 C) (03/22 0449) Pulse Rate:  [59-104] 65 (03/22 0449) Resp:  [8-24] 18 (03/22 0449) BP: (139-178)/(55-88) 156/68 mmHg (03/22 0449) SpO2:  [97 %-100 %] 100 % (03/22 0449) Weight:  [76.93 kg (  169 lb 9.6 oz)] 76.93 kg (169 lb 9.6 oz) (03/21 1349)   General:   Alert, older-appearing than stated age, pleasant and cooperative in NAD Head:  Normocephalic and atraumatic. Eyes:  Sclera clear, no icterus.   Conjunctiva pale Ears:  Normal auditory acuity. Nose:  No deformity, discharge,  or lesions. Mouth:  No deformity or lesions.  Oropharynx pale and dry Neck:  Supple; no masses or thyromegaly. Lungs:  Clear throughout to auscultation.   No wheezes, crackles, or rhonchi. No acute distress. Heart:  Regular rate and rhythm; no murmurs, clicks, rubs,  or gallops. Abdomen:  Soft, nontender and nondistended. No masses,  hepatosplenomegaly or hernias noted. Normal bowel sounds, without guarding, and without rebound.     Msk:  Symmetrical without gross deformities. Normal posture. Pulses:  Normal pulses noted. Extremities:  Without clubbing or edema. Neurologic:  Alert and  oriented x4;  grossly normal neurologically. Skin:  Intact without significant lesions or rashes. Psych:  Alert and cooperative. Normal mood and affect.   Lab Results:  Recent Labs  03/18/16 1055 03/19/16 1403 03/20/16 0526  WBC 4.7 5.0 5.9  HGB 7.1* 6.9* 9.6*  HCT 24.9* 24.2* 31.3*  PLT 216 222 181   BMET  Recent Labs  03/18/16 1055 03/19/16 1403 03/20/16 0526  NA 143 144 143  K 3.9 4.1 3.6  CL 106 109 107  CO2 24 25 25   GLUCOSE 105* 108* 105*  BUN <5* <5* <5*  CREATININE 0.99 0.99 0.99  CALCIUM 9.2 9.1 9.2   LFT  Recent Labs  03/20/16 0526  PROT 6.4*  ALBUMIN 3.6  AST 24  ALT 18  ALKPHOS 75  BILITOT 0.8   PT/INR No results for input(s): LABPROT, INR in the last 72 hours.  Studies/Results: Dg Chest 2 View  03/18/2016  CLINICAL DATA:  Shortness of breath, weakness, arm and jaw pain beginning yesterday. Initial encounter. EXAM: CHEST  2 VIEW COMPARISON:  None. FINDINGS: The lungs are clear. Heart size is normal. No pneumothorax or pleural effusion. No focal bony abnormality. IMPRESSION: Negative chest. Electronically Signed   By: Drusilla Kannerhomas  Dalessio M.D.   On: 03/18/2016 11:21   Ct Abdomen Pelvis W Contrast  03/19/2016  CLINICAL DATA:  Left lower quadrant pain EXAM: CT ABDOMEN AND PELVIS WITH CONTRAST TECHNIQUE: Multidetector CT imaging of the abdomen and pelvis was performed using the standard protocol following bolus administration of intravenous contrast. CONTRAST:  100mL OMNIPAQUE IOHEXOL 300 MG/ML  SOLN COMPARISON:  10/29/2011 FINDINGS: Diffuse hepatic steatosis Gallbladder, spleen, pancreas, and adrenal glands are within normal limits Bilateral nephrolithiasis is present. Largest stone on the left is in  the lower pole measures 8.5 mm. No hydronephrosis. No definite ureteral calculus. Bladder and prostate are within normal limits. Sigmoid colon is unremarkable. No evidence of bowel obstruction. Appendix is nonvisualized. No free-fluid. No abnormal retroperitoneal adenopathy small left para-aortic nodes are noted. There is extensive irregular plaque throughout the infrarenal aorta. There is also calcified plaque at the origin of the SMA. Exact narrowing cannot be made on this study. Celiac is grossly patent. IMA is grossly patent. Atherosclerotic changes of the iliac arteries are present. There is no obvious focal severe narrowing or occlusion. There is extensive plaque within the right common femoral artery. No vertebral compression deformity. IMPRESSION: Bilateral nephrolithiasis. Extensive atherosclerotic changes. Significant narrowing at the origin of the SMA cannot be excluded by this study. Consider CT angiogram of the abdomen if mesenteric ischemia is suspected. Specifically, if there is a history of  postprandial pain and weight loss Electronically Signed   By: Jolaine Click M.D.   On: 03/19/2016 19:20   Impression:  1.  Iron-deficiency anemia, hemoccult-negative stools, no overt GI bleeding. 2.  Intermittent mild left lower quadrant abdominal pain.  CT shows no diverticulitis or obvious mass, but does show extensive atherosclerosis. 3.  Chronic constipation. 4.  Shortness of breath, resolved after blood transfusion yesterday.  Plan:  1.  I have advised colonoscopy +/- endoscopy tomorrow for further evaluation of his anemia.  Patient, however, very much wants to go home and expresses willingness to have these procedures next week as an outpatient. 2.  Will accordingly plan on expedited endoscopy and colonoscopy as outpatient next week, unless patient changes his mind. 3.  Case discussed directly with Dr. Rito Ehrlich of hospitalist service. 4.  Will sign-off; please call with questions; thank you for  the consultation.   LOS: 1 day   Freddy Jaksch  03/20/2016, 9:29 AM  Pager [phone] If no answer or after 5 PM call [phone]

## 2016-03-20 NOTE — Discharge Instructions (Signed)
Anemia, Nonspecific Anemia is a condition in which the concentration of red blood cells or hemoglobin in the blood is below normal. Hemoglobin is a substance in red blood cells that carries oxygen to the tissues of the body. Anemia results in not enough oxygen reaching these tissues.  CAUSES  Common causes of anemia include:   Excessive bleeding. Bleeding may be internal or external. This includes excessive bleeding from periods (in women) or from the intestine.   Poor nutrition.   Chronic kidney, thyroid, and liver disease.  Bone marrow disorders that decrease red blood cell production.  Cancer and treatments for cancer.  HIV, AIDS, and their treatments.  Spleen problems that increase red blood cell destruction.  Blood disorders.  Excess destruction of red blood cells due to infection, medicines, and autoimmune disorders. SIGNS AND SYMPTOMS   Minor weakness.   Dizziness.   Headache.  Palpitations.   Shortness of breath, especially with exercise.   Paleness.  Cold sensitivity.  Indigestion.  Nausea.  Difficulty sleeping.  Difficulty concentrating. Symptoms may occur suddenly or they may develop slowly.  DIAGNOSIS  Additional blood tests are often needed. These help your health care provider determine the best treatment. Your health care provider will check your stool for blood and look for other causes of blood loss.  TREATMENT  Treatment varies depending on the cause of the anemia. Treatment can include:   Supplements of iron, vitamin B12, or folic acid.   Hormone medicines.   A blood transfusion. This may be needed if blood loss is severe.   Hospitalization. This may be needed if there is significant continual blood loss.   Dietary changes.  Spleen removal. HOME CARE INSTRUCTIONS Keep all follow-up appointments. It often takes many weeks to correct anemia, and having your health care provider check on your condition and your response to  treatment is very important. SEEK IMMEDIATE MEDICAL CARE IF:   You develop extreme weakness, shortness of breath, or chest pain.   You become dizzy or have trouble concentrating.  You develop heavy vaginal bleeding.   You develop a rash.   You have bloody or black, tarry stools.   You faint.   You vomit up blood.   You vomit repeatedly.   You have abdominal pain.  You have a fever or persistent symptoms for more than 2-3 days.   You have a fever and your symptoms suddenly get worse.   You are dehydrated.  MAKE SURE YOU:  Understand these instructions.  Will watch your condition.  Will get help right away if you are not doing well or get worse.   This information is not intended to replace advice given to you by your health care provider. Make sure you discuss any questions you have with your health care provider.   Document Released: 01/23/2005 Document Revised: 08/18/2013 Document Reviewed: 06/11/2013 Elsevier Interactive Patient Education 2016 [employer]  

## 2016-03-20 NOTE — Discharge Summary (Signed)
[facility]  Physician Discharge Summary   Patient ID: Jonathan Holland MRN: [id number] DOB/AGE: 02/20/1946 70 y.o.  Admit date: 03/19/2016 Discharge date: 03/20/2016  PCP: Farris Has, MD  DISCHARGE DIAGNOSES:  Active Problems:   Anemia   Symptomatic anemia   RECOMMENDATIONS FOR OUTPATIENT FOLLOW UP: 1. Dr. Dulce Sellar to arrange for outpatient EGD and colonoscopy 2. Outpatient evaluation of elevated BP  DISCHARGE CONDITION: fair  Diet recommendation: As before  Filed Weights   03/19/16 1349  Weight: 76.93 kg (169 lb 9.6 oz)    INITIAL HISTORY: 70 year old Caucasian male presented with dyspnea and was found to have hemoglobin of 7.1. He was transfused. He was seen by gastroenterology. He was transfused.  Consultations:  Gastroenterology  Procedures:  Blood transfusion  HOSPITAL COURSE:   Symptomatic Microcytic anemia Patient was transfused 2 units of blood. His hemoglobin responded appropriately. Iron was noted to be low. TIBC was 438. Unfortunately, ferritin was not checked. Patient is already taking iron supplements. Patient denied any GI bleeding recently. He was seen by gastroenterology and endoscopy was offered. However, patient declined inpatient workup. He will pursue this as outpatient. Gastroenterology will arrange this as outpatient.  Abdominal pain Patient denies pain on a daily basis. He has noticed occasional his comfort. CT scan was done but did not show any acute findings. Patient is completely symptomatic at this time. Extensive atherosclerosis was noted. However, his symptoms are not suggestive of these included ischemia.  Incidental bilateral nephrolithiasis Incidental finding. He is asymptomatic.  Elevated blood pressure without known history of hypertension He was asked to follow-up with his primary care physician to discuss this further.  Overall, stable. Patient very keen on going home today. He declined inpatient workup. Workup to  be pursued as an outpatient. Hemoglobin has improved. He is stable for discharge.  PERTINENT LABS:  The results of significant diagnostics from this hospitalization (including imaging, microbiology, ancillary and laboratory) are listed below for reference.     Labs: Basic Metabolic Panel:  Recent Labs Lab 03/18/16 1055 03/19/16 1403 03/20/16 0526  NA 143 144 143  K 3.9 4.1 3.6  CL 106 109 107  CO2 GLUCOSE 105* 108* 105*  BUN <5* <5* <5*  CREATININE 0.99 0.99 0.99  CALCIUM 9.2 9.1 9.2   Liver Function Tests:  Recent Labs Lab 03/19/16 1403 03/20/16 0526  AST 23 24  ALT 17 18  ALKPHOS 80 75  BILITOT 0.4 0.8  PROT 6.3* 6.4*  ALBUMIN 3.7 3.6   CBC:  Recent Labs Lab 03/18/16 1055 03/19/16 1403 03/20/16 0526  WBC 4.7 5.0 5.9  HGB 7.1* 6.9* 9.6*  HCT 24.9* 24.2* 31.3*  MCV 72.0* 72.9* 73.5*  PLT 216 222 181    IMAGING STUDIES Dg Chest 2 View  03/18/2016  CLINICAL DATA:  Shortness of breath, weakness, arm and jaw pain beginning yesterday. Initial encounter. EXAM: CHEST  2 VIEW COMPARISON:  None. FINDINGS: The lungs are clear. Heart size is normal. No pneumothorax or pleural effusion. No focal bony abnormality. IMPRESSION: Negative chest. Electronically Signed   By: Drusilla Kanner M.D.   On: 03/18/2016 11:21   Ct Abdomen Pelvis W Contrast  03/19/2016  CLINICAL DATA:  Left lower quadrant pain EXAM: CT ABDOMEN AND PELVIS WITH CONTRAST TECHNIQUE: Multidetector CT imaging of the abdomen and pelvis was performed using the standard protocol following bolus administration of intravenous contrast. CONTRAST:  OMNIPAQUE IOHEXOL 300 MG/ML  SOLN COMPARISON:  10/29/2011 FINDINGS: Diffuse hepatic steatosis Gallbladder, spleen,  pancreas, and adrenal glands are within normal limits Bilateral nephrolithiasis is present. Largest stone on the left is in the lower pole measures 8.5 mm. No hydronephrosis. No definite ureteral calculus. Bladder and prostate are within normal  limits. Sigmoid colon is unremarkable. No evidence of bowel obstruction. Appendix is nonvisualized. No free-fluid. No abnormal retroperitoneal adenopathy small left para-aortic nodes are noted. There is extensive irregular plaque throughout the infrarenal aorta. There is also calcified plaque at the origin of the SMA. Exact narrowing cannot be made on this study. Celiac is grossly patent. IMA is grossly patent. Atherosclerotic changes of the iliac arteries are present. There is no obvious focal severe narrowing or occlusion. There is extensive plaque within the right common femoral artery. No vertebral compression deformity. IMPRESSION: Bilateral nephrolithiasis. Extensive atherosclerotic changes. Significant narrowing at the origin of the SMA cannot be excluded by this study. Consider CT angiogram of the abdomen if mesenteric ischemia is suspected. Specifically, if there is a history of postprandial pain and weight loss Electronically Signed   By: Jolaine ClickArthur  Hoss M.D.   On: 03/19/2016 19:20    DISCHARGE EXAMINATION: Filed Vitals:   03/20/16 0022 03/20/16 0055 03/20/16 0353 03/20/16 0449  BP: 178/76 163/69 164/66 156/68  Pulse: 75 75 71 65  Temp: 98.7 F (37.1 C) 98.5 F (36.9 C) 98.6 F (37 C) 98.2 F (36.8 C)  TempSrc: Oral Oral Oral Oral  Resp: 16 16  18   Height:      Weight:      SpO2: 100% 100% 100% 100%   General appearance: alert, cooperative, appears stated age and no distress Resp: clear to auscultation bilaterally Cardio: regular rate and rhythm, S1, S2 normal, no murmur, click, rub or gallop GI: soft, non-tender; bowel sounds normal; no masses,  no organomegaly Extremities: extremities normal, atraumatic, no cyanosis or edema Neurologic: No focal deficits  DISPOSITION: Home  Discharge Instructions    Call MD for:  difficulty breathing, headache or visual disturbances    Complete by:  As directed      Call MD for:  extreme fatigue    Complete by:  As directed      Call MD for:   persistant dizziness or light-headedness    Complete by:  As directed      Call MD for:  persistant nausea and vomiting    Complete by:  As directed      Call MD for:  severe uncontrolled pain    Complete by:  As directed      Call MD for:  temperature >100.4    Complete by:  As directed      Diet general    Complete by:  As directed      Discharge instructions    Complete by:  As directed   Please go for outpatient testing as discussed. Dr. Dulce Sellarutlaw will arrange. Please seek attention if you develop any rectal bleeding, black stool or abdominal pain in the interim. Stop taking BC headache powder for now. Talk to your PCP regarding elevated blood pressures.  You were cared for by a hospitalist during your hospital stay. If you have any questions about your discharge medications or the care you received while you were in the hospital after you are discharged, you can call the unit and asked to speak with the hospitalist on call if the hospitalist that took care of you is not available. Once you are discharged, your primary care physician will handle any further medical issues. Please note that NO  REFILLS for any discharge medications will be authorized once you are discharged, as it is imperative that you return to your primary care physician (or establish a relationship with a primary care physician if you do not have one) for your aftercare needs so that they can reassess your need for medications and monitor your lab values. If you do not have a primary care physician, you can call [phone] for a physician referral.     Increase activity slowly    Complete by:  As directed            ALLERGIES:  Allergies  Allergen Reactions  . Penicillins      Discharge Medication List as of 03/20/2016 10:36 AM    CONTINUE these medications which have NOT CHANGED   Details  ferrous sulfate 325 (65 FE) MG tablet Take 325 mg by mouth daily with breakfast., Until Discontinued, Historical Med      HYDROcodone-acetaminophen (NORCO/VICODIN) 5-325 MG tablet Take 1 tablet by mouth every 6 (six) hours as needed for moderate pain., Until Discontinued, Historical Med    Multiple Vitamin (MULTIVITAMIN) tablet Take 1 tablet by mouth daily., Until Discontinued, Historical Med    Sennosides (SENNA) 8.6 MG CAPS Take 8.6 mg by mouth daily as needed (constipation)., Until Discontinued, Historical Med      STOP taking these medications     Aspirin-Salicylamide-Caffeine (BC HEADACHE POWDER PO)      oxyCODONE-acetaminophen (PERCOCET) 7.5-325 MG tablet        Follow-up Information    Call Farris Has, MD.   Specialty:  Family Medicine   Why:  As needed   Contact information:   [address] [place] [address] [phone]       Follow up with Freddy Jaksch, MD.   Specialty:  Gastroenterology   Why:  he will arrange follow up   Contact information:   [address] [place] [address] [phone]       TOTAL DISCHARGE TIME: 35 minutes  [facility]  [facility] Pager [phone]  03/20/2016, 2:48 PM

## 2016-03-21 ENCOUNTER — Other Ambulatory Visit: Payer: Self-pay | Admitting: Acute Care

## 2016-03-21 ENCOUNTER — Encounter: Payer: Self-pay | Admitting: *Deleted

## 2016-03-21 DIAGNOSIS — F17210 Nicotine dependence, cigarettes, uncomplicated: Secondary | ICD-10-CM

## 2016-03-21 LAB — TYPE AND SCREEN
ABO/RH(D): O POS
ANTIBODY SCREEN: NEGATIVE
Unit division: 0
Unit division: 0
Unit division: 0

## 2016-03-21 LAB — HAPTOGLOBIN: HAPTOGLOBIN: 125 mg/dL (ref 34–200)

## 2016-03-25 ENCOUNTER — Encounter: Payer: Commercial Managed Care - HMO | Admitting: Acute Care

## 2016-03-26 ENCOUNTER — Encounter: Payer: Self-pay | Admitting: Cardiology

## 2016-03-26 ENCOUNTER — Ambulatory Visit (INDEPENDENT_AMBULATORY_CARE_PROVIDER_SITE_OTHER): Payer: Commercial Managed Care - HMO | Admitting: Cardiology

## 2016-03-26 VITALS — BP 158/80 | HR 81 | Ht 71.0 in | Wt 165.0 lb

## 2016-03-26 DIAGNOSIS — R0609 Other forms of dyspnea: Secondary | ICD-10-CM

## 2016-03-26 DIAGNOSIS — Z720 Tobacco use: Secondary | ICD-10-CM | POA: Insufficient documentation

## 2016-03-26 DIAGNOSIS — R011 Cardiac murmur, unspecified: Secondary | ICD-10-CM

## 2016-03-26 DIAGNOSIS — E785 Hyperlipidemia, unspecified: Secondary | ICD-10-CM

## 2016-03-26 DIAGNOSIS — I700 Atherosclerosis of aorta: Secondary | ICD-10-CM | POA: Diagnosis not present

## 2016-03-26 HISTORY — DX: Atherosclerosis of aorta: I70.0

## 2016-03-26 HISTORY — DX: Tobacco use: Z72.0

## 2016-03-26 HISTORY — DX: Hyperlipidemia, unspecified: E78.5

## 2016-03-26 HISTORY — DX: Cardiac murmur, unspecified: R01.1

## 2016-03-26 MED ORDER — ASPIRIN EC 81 MG PO TBEC: 81.0000 mg | DELAYED_RELEASE_TABLET | Freq: Every day | ORAL | Status: DC

## 2016-03-26 NOTE — Patient Instructions (Signed)
Medication Instructions:  1) START ASPIRIN 81 mg daily  Labwork: None  Testing/Procedures: Your physician has requested that you have an echocardiogram. Echocardiography is a painless test that uses sound waves to create images of your heart. It provides your doctor with information about the size and shape of your heart and how well your heart's chambers and valves are working. This procedure takes approximately one hour. There are no restrictions for this procedure.   Dr. Mayford Knifeurner recommends you have an EXERCISE NUCLEAR STRESS TEST.  Follow-Up: You have been referred to Dr. Allyson SabalBerry for PVD.  Your physician wants you to follow-up in: 1 year with Dr. Mayford Knifeurner. You will receive a reminder letter in the mail two months in advance. If you don't receive a letter, please call our office to schedule the follow-up appointment.   Any Other Special Instructions Will Be Listed Below (If Applicable).     If you need a refill on your cardiac medications before your next appointment, please call your pharmacy.

## 2016-03-26 NOTE — Progress Notes (Signed)
Cardiology Office Note   Date:  03/26/2016   ID:  Jonathan NeighborsJohnny W Holland, DOB Apr 22, 1946, MRN [id number]  PCP:  Farris HasMORROW, AARON, MD    Chief Complaint  Patient presents with  . Shortness of Breath  . Heart Murmur      History of Present Illness: Jonathan HurryJohnny W Holland is a 70 y.o. male who presents for evaluation of DOE.  He is a long time smoker and has been unable to quit in the past for a long period of time.  He has a history of PTSD, depression, family history of CAD in mid 2750's (Father), dyslipidemia and chronic pain.  He was recently seen by his PCP and complained of an episode of his skin burning for 30 seconds while in the shower and then went away.  It was associated with SOB and a feeling of being bloated.  He says that he could not walk across the room due to SOB.  He went to the ER and was evaluated. His hbg was found to be 8.8 and was started on iron.  He also was found to have a heart murmur by his PCP.  Abdominal and pelvic CT showed extensive atherosclerosis of the abdominal aorta including the mesenteric vessels and renal artery.  He denies any exertional CP, palpitations, dizziness, LE edema or syncope. He denies any abdominal pain.  He had severe cramps in his legs prior to going to the ER which has resolved with iron.      Past Medical History  Diagnosis Date  . SOB (shortness of breath)   . Heart murmur   . PTSD (post-traumatic stress disorder)   . Hyperlipidemia   . Chronic pain   . Atherosclerosis of aorta (HCC) 03/26/2016  . Tobacco abuse 03/26/2016  . Heart murmur 03/26/2016  . Hyperlipidemia with target LDL less than 70 03/26/2016    No past surgical history on file.   Current Outpatient Prescriptions  Medication Sig Dispense Refill  . albuterol (PROVENTIL HFA;VENTOLIN HFA) 108 (90 Base) MCG/ACT inhaler Inhale 2 puffs into the lungs every 4 (four) hours as needed for wheezing or shortness of breath.    Jonathan Holland. atorvastatin (LIPITOR) 10 MG tablet Take 10  mg by mouth daily. hasnt started taking yet.: 03/26/16    . ferrous sulfate 325 (65 FE) MG tablet Take 325 mg by mouth daily with breakfast.    . HYDROcodone-acetaminophen (NORCO/VICODIN) 5-325 MG tablet Take 1 tablet by mouth every 6 (six) hours as needed for moderate pain.    . Multiple Vitamin (MULTIVITAMIN) tablet Take 1 tablet by mouth daily.    . ranitidine (ZANTAC) 150 MG capsule Take 150 mg by mouth 2 (two) times daily.    . Sennosides (SENNA) 8.6 MG CAPS Take 8.6 mg by mouth daily as needed (constipation).    . tamsulosin (FLOMAX) 0.4 MG CAPS capsule Take 0.4 mg by mouth daily.     No current facility-administered medications for this visit.    Allergies:   Penicillins    Social History:  The patient  reports that he has been smoking Cigarettes.  He started smoking about 52 years ago. He has a 30 pack-year smoking history. He does not have any smokeless tobacco history on file. He reports that he does not drink alcohol or use illicit drugs.   Family History:  The patient's family history includes Congestive Heart Failure in his father.  ROS:  Please see the history of present illness.   Otherwise, review of systems are positive for none.   All other systems are reviewed and negative.    PHYSICAL EXAM: VS:  BP 158/80 mmHg  Pulse 81  Ht  (1.803 m)  Wt 165 lb (74.844 kg)  BMI 23.02 kg/m2  SpO2 98% , BMI Body mass index is 23.02 kg/(m^2). GEN: Well nourished, well developed, in no acute distress HEENT: normal Neck: no JVD, carotid bruits, or masses Cardiac: RRR; no  rubs, or gallops,no edema.  2/6 SM at RUSB and apex.  1+ posterior tibal pulse on right and trace left PT pulse Respiratory:  clear to auscultation bilaterally, normal work of breathing GI: soft, nontender, nondistended, + BS Skin: warm and dry, no rash Neuro:  Strength and sensation are intact Psych: euthymic mood, full affect   EKG:  EKG is ordered today. The ekg ordered today demonstrates NSR with  nonspecific ST abnormality   Recent Labs: 03/20/2016: ALT 18; BUN <5*; Creatinine, Ser 0.99; Hemoglobin 9.6*; Platelets 181; Potassium 3.6; Sodium 143    Lipid Panel No results found for: CHOL, TRIG, HDL, CHOLHDL, VLDL, LDLCALC, LDLDIRECT    Wt Readings from Last 3 Encounters:  03/26/16 165 lb (74.844 kg)  03/19/16 169 lb 9.6 oz (76.93 kg)  03/18/16 162 lb (73.483 kg)       ASSESSMENT AND PLAN:  1.  DOE of unclear etiology.  He was recently found to be anemic with Hbg 8.8 which could be the etiology. The SOB has significantly improved since starting iron.   He also had a history of significant tobacco use so need to consider COPD as well as coronary ischemia.  He denies any chest pain.  He has CRFs including family history of premature CAD, tobacco use, hyperlipidemia and extensive PVD of the abdominal aorta.   I will get a 2D echo to assess LVF as well as Lexiscan myoview to rule out ischemia.   2.  Heart murmur - check 2D echo.  3.  Dyslipidemia - LDL goal < 70.  continue statin 4.  PVD by abdominal CT - I will refer to Dr.Berry for further evaluation.  He had severe leg cramps in his calfs when he was severely anemic and this has improved. Continue statin and add ASA  daily.   5.  Tobacco abuse - he has tried all meds and does not want to try them again.  I discussed the long term effects of tobacco use including CAD and lung disease.    Current medicines are reviewed at length with the patient today.  The patient does not have concerns regarding medicines.  The following changes have been made:  no change  Labs/ tests ordered today: See above Assessment and Plan No orders of the defined types were placed in this encounter.     Disposition:   FU with me PRN pending the results of the studies   Signed, Quintella Reichert, MD  03/26/2016 10:18 AM    [facility] [address] [place], [place], [place]  [address] Phone: [phone]; Fax: [phone]

## 2016-03-27 ENCOUNTER — Telehealth: Payer: Self-pay

## 2016-03-27 DIAGNOSIS — D509 Iron deficiency anemia, unspecified: Secondary | ICD-10-CM | POA: Diagnosis not present

## 2016-03-27 DIAGNOSIS — R030 Elevated blood-pressure reading, without diagnosis of hypertension: Secondary | ICD-10-CM | POA: Diagnosis not present

## 2016-03-27 DIAGNOSIS — Z720 Tobacco use: Secondary | ICD-10-CM | POA: Diagnosis not present

## 2016-03-27 DIAGNOSIS — R011 Cardiac murmur, unspecified: Secondary | ICD-10-CM | POA: Diagnosis not present

## 2016-03-27 DIAGNOSIS — Z09 Encounter for follow-up examination after completed treatment for conditions other than malignant neoplasm: Secondary | ICD-10-CM | POA: Diagnosis not present

## 2016-03-27 NOTE — Telephone Encounter (Signed)
Reminded patient that he is to see Dr. Allyson SabalBerry for evaluation of PVD seen on CT. Patient reported he cannot afford any copays at this time, but will call back when he can to schedule. Patient was grateful for call.

## 2016-03-27 NOTE — Telephone Encounter (Signed)
-----   Message from Clarita LeberSonya T Pegram sent at 03/27/2016  9:54 AM EDT ----- Regarding: RE: Dr. Allyson SabalBerry appointment  Pt wants to know why we are scheduling him with Dr Allyson SabalBerry. He would like a call back with information about what he does and why we are sending him to Dr Allyson SabalBerry.  Im not sure how i missed this yesterday but i am sorry.   Lamar LaundrySonya  ----- Message -----    From: Henrietta DineKathryn A Kemp, RN    Sent: 03/26/2016   4:42 PM      To: [facility] Subject: Dr. Allyson SabalBerry appointment                          This patient was supposed to be scheduled with Dr. Allyson SabalBerry for evaluation of PVD at check out today. Will one of you call the patient and schedule appointment please?   Thanks ladies!

## 2016-04-09 DIAGNOSIS — D649 Anemia, unspecified: Secondary | ICD-10-CM | POA: Diagnosis not present

## 2016-04-10 ENCOUNTER — Other Ambulatory Visit (HOSPITAL_COMMUNITY): Payer: Commercial Managed Care - HMO

## 2016-04-10 ENCOUNTER — Encounter (HOSPITAL_COMMUNITY): Payer: Commercial Managed Care - HMO

## 2016-05-31 DIAGNOSIS — Z1159 Encounter for screening for other viral diseases: Secondary | ICD-10-CM | POA: Diagnosis not present

## 2016-05-31 DIAGNOSIS — Z720 Tobacco use: Secondary | ICD-10-CM | POA: Diagnosis not present

## 2016-05-31 DIAGNOSIS — R221 Localized swelling, mass and lump, neck: Secondary | ICD-10-CM | POA: Diagnosis not present

## 2016-05-31 DIAGNOSIS — D509 Iron deficiency anemia, unspecified: Secondary | ICD-10-CM | POA: Diagnosis not present

## 2016-06-10 DIAGNOSIS — K5900 Constipation, unspecified: Secondary | ICD-10-CM | POA: Diagnosis not present

## 2016-06-10 DIAGNOSIS — D509 Iron deficiency anemia, unspecified: Secondary | ICD-10-CM | POA: Diagnosis not present

## 2016-06-10 DIAGNOSIS — Z1211 Encounter for screening for malignant neoplasm of colon: Secondary | ICD-10-CM | POA: Diagnosis not present

## 2016-06-16 DIAGNOSIS — Z1212 Encounter for screening for malignant neoplasm of rectum: Secondary | ICD-10-CM | POA: Diagnosis not present

## 2016-06-16 DIAGNOSIS — Z1211 Encounter for screening for malignant neoplasm of colon: Secondary | ICD-10-CM | POA: Diagnosis not present

## 2016-06-17 DIAGNOSIS — E785 Hyperlipidemia, unspecified: Secondary | ICD-10-CM | POA: Diagnosis not present

## 2016-06-17 DIAGNOSIS — Z0000 Encounter for general adult medical examination without abnormal findings: Secondary | ICD-10-CM | POA: Diagnosis not present

## 2016-06-17 DIAGNOSIS — Z125 Encounter for screening for malignant neoplasm of prostate: Secondary | ICD-10-CM | POA: Diagnosis not present

## 2016-06-17 DIAGNOSIS — Z1159 Encounter for screening for other viral diseases: Secondary | ICD-10-CM | POA: Diagnosis not present

## 2016-06-17 DIAGNOSIS — R030 Elevated blood-pressure reading, without diagnosis of hypertension: Secondary | ICD-10-CM | POA: Diagnosis not present

## 2016-06-17 DIAGNOSIS — Z23 Encounter for immunization: Secondary | ICD-10-CM | POA: Diagnosis not present

## 2016-06-17 DIAGNOSIS — G8929 Other chronic pain: Secondary | ICD-10-CM | POA: Diagnosis not present

## 2016-06-17 DIAGNOSIS — Z720 Tobacco use: Secondary | ICD-10-CM | POA: Diagnosis not present

## 2016-07-15 IMAGING — CT CT ABD-PELV W/ CM
2 of 5 series · 16 of 46 positions shown, 18 images · IV contrast (APPLIED)
Comparison: 10/29/2011

CLINICAL DATA: Left lower quadrant pain

EXAM:
CT ABDOMEN AND PELVIS WITH CONTRAST
TECHNIQUE: Multidetector CT imaging of the abdomen and pelvis was performed
using the standard protocol following bolus administration of
intravenous contrast.
CONTRAST:  100mL OMNIPAQUE IOHEXOL 300 MG/ML  SOLN

[Series 2: abd/ pelvis 5.0 i30f 1 · axial · 0.92mm/px · z∈[+856,+1250]mm · 13 of 89 slices shown, 15 images]
[im 5/89  soft-tissue]
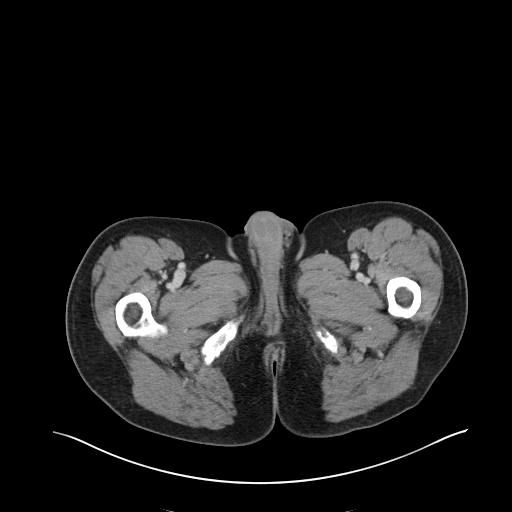
[im 5/89  bone]
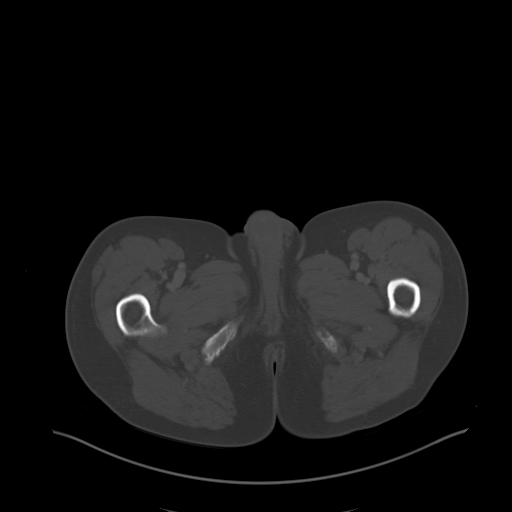
[im 14/89  soft-tissue]
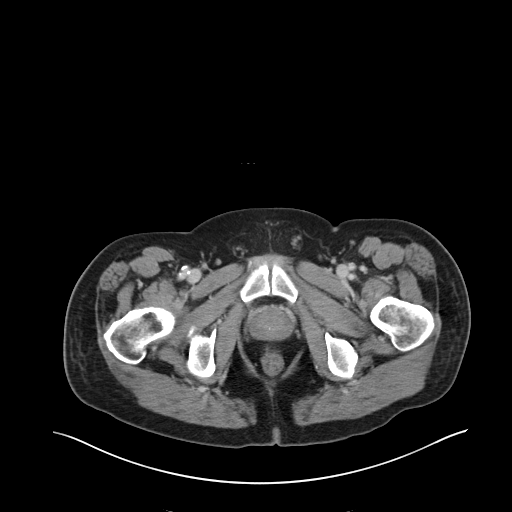
[im 19/89  soft-tissue]
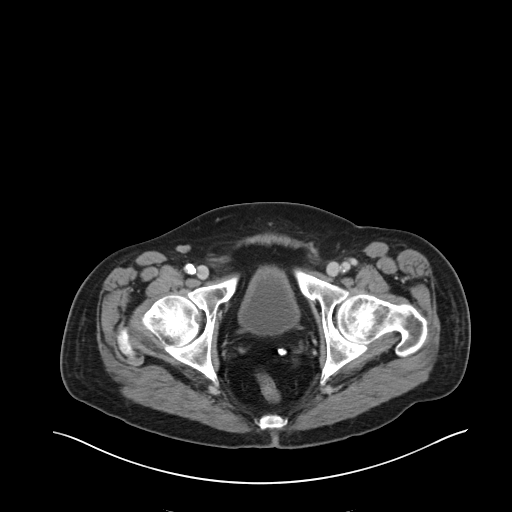
[im 24/89  soft-tissue]
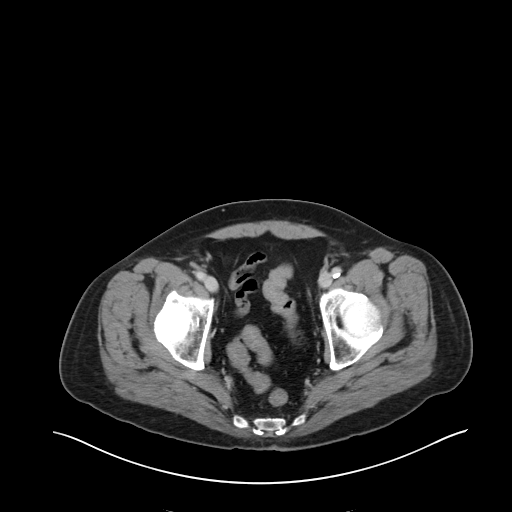
[im 33/89  soft-tissue]
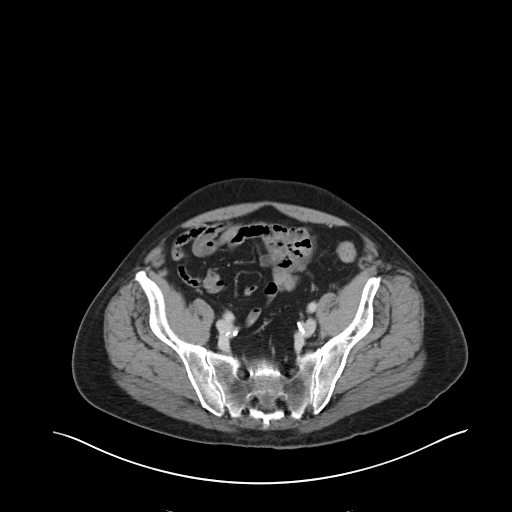
[im 38/89  soft-tissue]
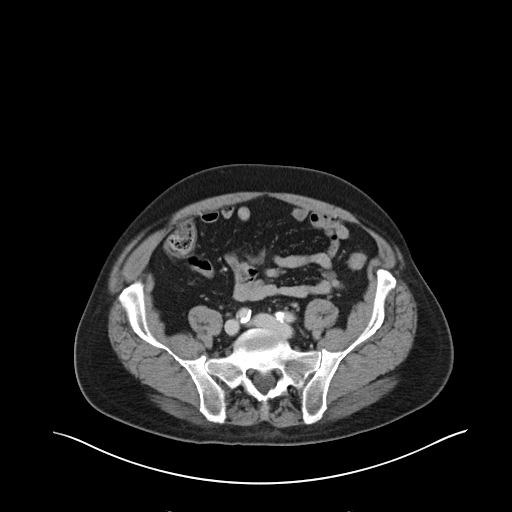
[im 47/89  soft-tissue]
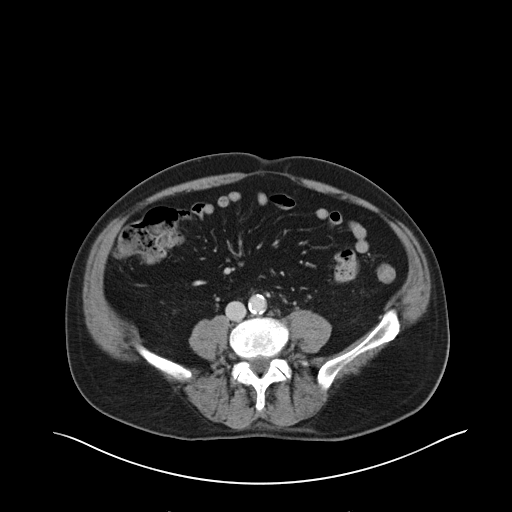
[im 51/89  soft-tissue]
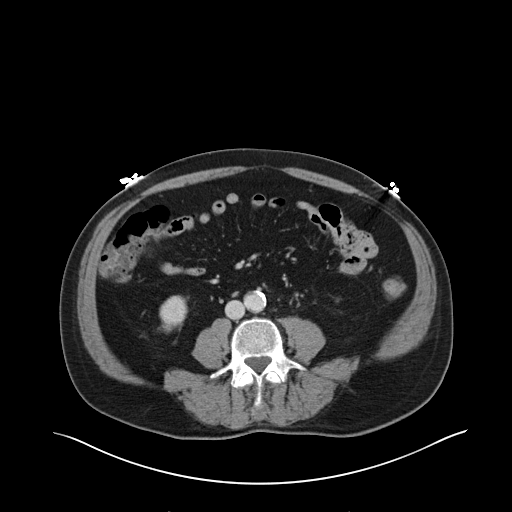
[im 56/89  soft-tissue]
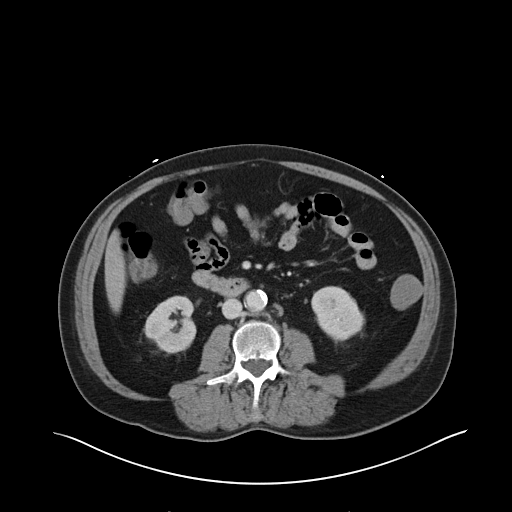
[im 56/89  bone]
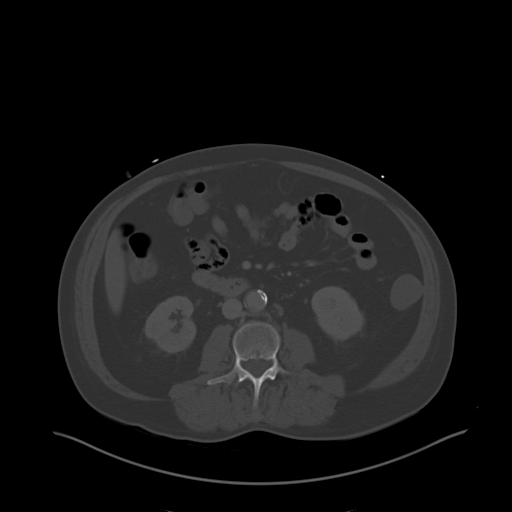
[im 65/89  soft-tissue]
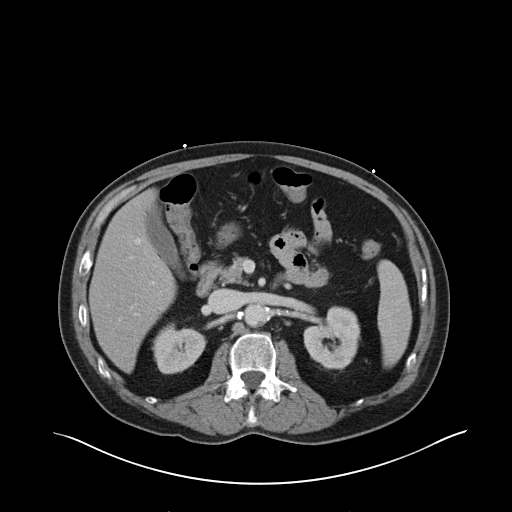
[im 70/89  soft-tissue]
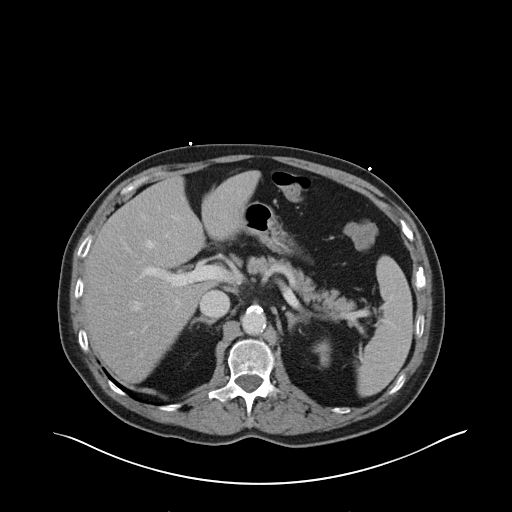
[im 75/89  soft-tissue]
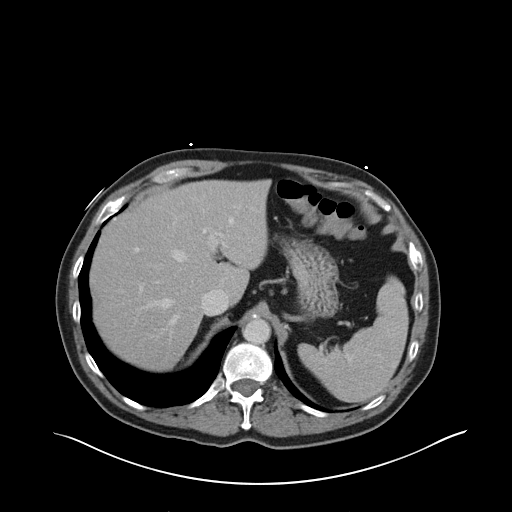
[im 84/89  soft-tissue]
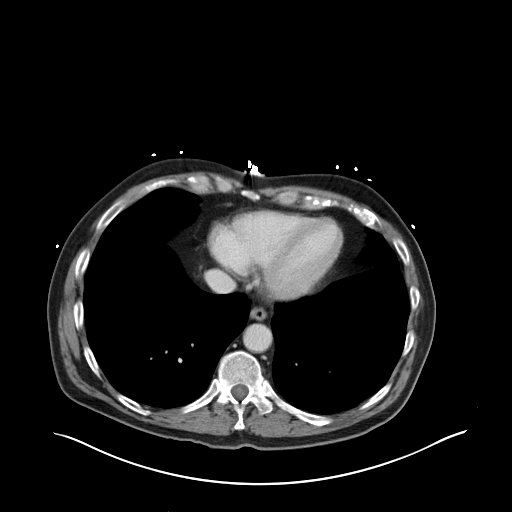

[Series 5: coronal soft tissue · coronal · 0.91mm/px · 3 of 88 slices shown]
[im 30/88  soft-tissue]
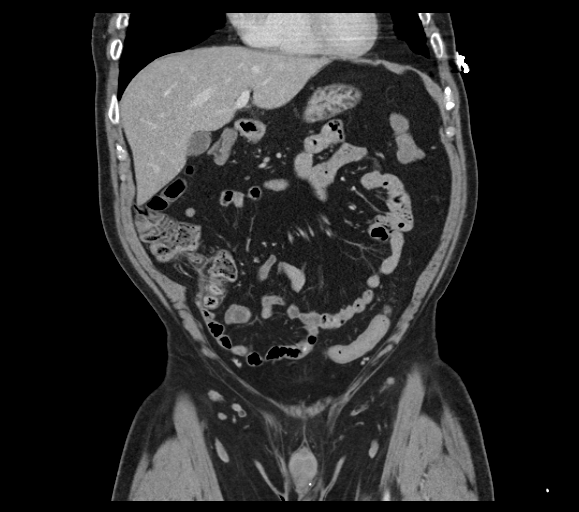
[im 39/88  soft-tissue]
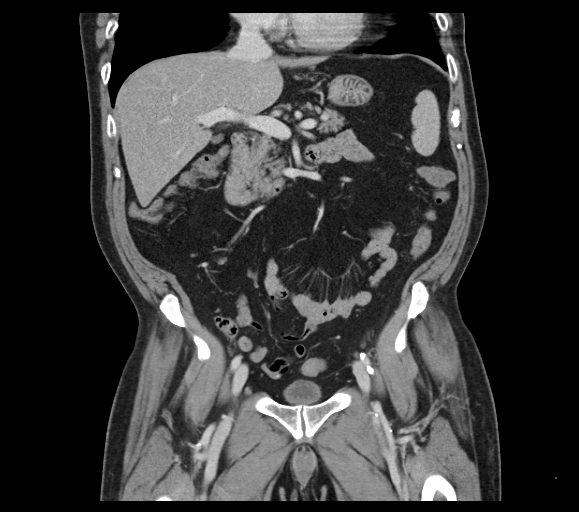
[im 49/88  soft-tissue]
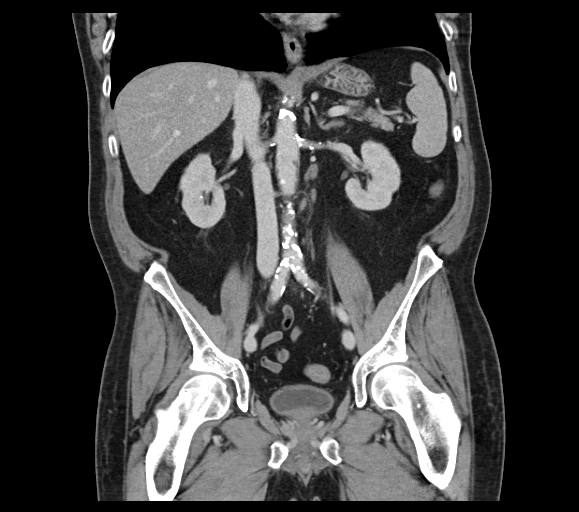

[16 of 46 positions shown; findings below may reference images not displayed]

FINDINGS: Diffuse hepatic steatosis

Gallbladder, spleen, pancreas, and adrenal glands are within normal
limits

Bilateral nephrolithiasis is present. Largest stone on the left is
in the lower pole measures 8.5 mm. No hydronephrosis. No definite
ureteral calculus.

Bladder and prostate are within normal limits. Sigmoid colon is
unremarkable. No evidence of bowel obstruction. Appendix is
nonvisualized.

No free-fluid. No abnormal retroperitoneal adenopathy small left
para-aortic nodes are noted.

There is extensive irregular plaque throughout the infrarenal aorta.
There is also calcified plaque at the origin of the SMA. Exact
narrowing cannot be made on this study. Celiac is grossly patent.
IMA is grossly patent. Atherosclerotic changes of the iliac arteries
are present. There is no obvious focal severe narrowing or
occlusion. There is extensive plaque within the right common femoral
artery.

No vertebral compression deformity.
IMPRESSION: Bilateral nephrolithiasis.

Extensive atherosclerotic changes. Significant narrowing at the
origin of the SMA cannot be excluded by this study. Consider CT
angiogram of the abdomen if mesenteric ischemia is suspected.
Specifically, if there is a history of postprandial pain and weight
loss

## 2016-08-22 ENCOUNTER — Telehealth: Payer: Self-pay | Admitting: Acute Care

## 2016-08-22 NOTE — Telephone Encounter (Signed)
Referral was canceled on 07/11/16 for the lung cancer screening program due to pt' refusing to particpate in program. I sent notification to Dr. Vincente LibertyMorrow's office via fax on 07/11/16. Form was sent to be scanned into chart. Form was received back on 08/16/16 stating that it could not be scanned into chart.

## 2017-02-24 ENCOUNTER — Telehealth (INDEPENDENT_AMBULATORY_CARE_PROVIDER_SITE_OTHER): Payer: Self-pay | Admitting: *Deleted

## 2017-02-24 NOTE — Telephone Encounter (Signed)
Can you advise? 

## 2017-02-24 NOTE — Telephone Encounter (Signed)
Pt wife called stating she needs referral sent to [facility] for physical therapy [address], [place] [id number] FAX: [phone] phone [phone]. be

## 2017-02-25 NOTE — Telephone Encounter (Signed)
Do not see where we have seen this patient. I called no answer to pt #. Is this correct patient?

## 2017-02-27 NOTE — Telephone Encounter (Signed)
I could not reach as well. Unsure if correct patient. Will wait on return call.

## 2017-06-22 ENCOUNTER — Encounter (HOSPITAL_COMMUNITY): Payer: Self-pay

## 2017-06-22 ENCOUNTER — Emergency Department (HOSPITAL_COMMUNITY)
Admission: EM | Admit: 2017-06-22 | Discharge: 2017-06-22 | Disposition: A | Discharge status: Home / Self Care | Payer: Medicare HMO | Attending: Emergency Medicine | Admitting: Emergency Medicine

## 2017-06-22 DIAGNOSIS — I10 Essential (primary) hypertension: Secondary | ICD-10-CM | POA: Diagnosis not present

## 2017-06-22 DIAGNOSIS — Z7982 Long term (current) use of aspirin: Secondary | ICD-10-CM | POA: Insufficient documentation

## 2017-06-22 DIAGNOSIS — Z79899 Other long term (current) drug therapy: Secondary | ICD-10-CM | POA: Insufficient documentation

## 2017-06-22 DIAGNOSIS — F17210 Nicotine dependence, cigarettes, uncomplicated: Secondary | ICD-10-CM | POA: Insufficient documentation

## 2017-06-22 DIAGNOSIS — R531 Weakness: Secondary | ICD-10-CM | POA: Diagnosis not present

## 2017-06-22 DIAGNOSIS — D649 Anemia, unspecified: Secondary | ICD-10-CM | POA: Diagnosis present

## 2017-06-22 HISTORY — DX: Anemia, unspecified: D64.9

## 2017-06-22 HISTORY — DX: Essential (primary) hypertension: I10

## 2017-06-22 LAB — COMPREHENSIVE METABOLIC PANEL
ALBUMIN: 4.3 g/dL (ref 3.5–5.0)
ALT: 13 U/L — AB (ref 17–63)
ANION GAP: 6 (ref 5–15)
AST: 21 U/L (ref 15–41)
Alkaline Phosphatase: 82 U/L (ref 38–126)
BUN: <5 mg/dL — ABNORMAL LOW (ref 6–20)
CHLORIDE: 105 mmol/L (ref 101–111)
CO2: 28 mmol/L (ref 22–32)
CREATININE: 0.99 mg/dL (ref 0.61–1.24)
Calcium: 9.4 mg/dL (ref 8.9–10.3)
GFR calc non Af Amer: >60 mL/min (ref >60)
Glucose, Bld: 96 mg/dL (ref 65–99)
Potassium: 3.5 mmol/L (ref 3.5–5.1)
SODIUM: 139 mmol/L (ref 135–145)
Total Bilirubin: 0.7 mg/dL (ref 0.3–1.2)
Total Protein: 7 g/dL (ref 6.5–8.1)

## 2017-06-22 LAB — CBC
HCT: 42.6 % (ref 39.0–52.0)
HEMOGLOBIN: 14.7 g/dL (ref 13.0–17.0)
MCH: 30.4 pg (ref 26.0–34.0)
MCHC: 34.5 g/dL (ref 30.0–36.0)
MCV: 88.2 fL (ref 78.0–100.0)
Platelets: 154 10*3/uL (ref 150–400)
RBC: 4.83 MIL/uL (ref 4.22–5.81)
RDW: 12.5 % (ref 11.5–15.5)
WBC: 8.5 10*3/uL (ref 4.0–10.5)

## 2017-06-22 NOTE — ED Triage Notes (Signed)
Patient complains of weakness that started today. Patient states that when he feels this way he knows his hemoglobin is low. Has anemia for years and wants his blood checked. No pain, no CP. Patient VSS and color normal

## 2017-06-22 NOTE — ED Provider Notes (Signed)
[facility]-EMERGENCY DEPT Provider Note   CSN: [id number] Arrival date & time: 06/22/17  1714     History   Chief Complaint No chief complaint on file.   HPI Jonathan Holland is a 71 y.o. male.  Patient is a 71 year old male with a history of chronic anemia who presents with fatigue. He states that for years he's dealt with chronic anemia. He states he's been evaluated for GI bleeding but has always tested negative for blood in his stool. He's not sure why he is chronically anemic. However normally when he gets fatigued like this and his blood counts are low, he comes in to get blood transfusion. He states this morning he had an episode where he felt very fatigued for about 15-20 minutes. He feels back to baseline now. He currently denies any symptoms but he wants his blood counts checked. He denies any fevers. No cough or chest congestion. No shortness of breath. No chest pain. No rashes. No fevers. No nausea vomiting or diarrhea. He did pull some ticks off of him recently but has not had any fevers or rashes.      Past Medical History:  Diagnosis Date  . Anemia   . Atherosclerosis of aorta (HCC) 03/26/2016  . Chronic pain   . Heart murmur   . Heart murmur 03/26/2016  . Hyperlipidemia   . Hyperlipidemia with target LDL less than 70 03/26/2016  . Hypertension   . PTSD (post-traumatic stress disorder)   . SOB (shortness of breath)   . Tobacco abuse 03/26/2016    Patient Active Problem List   Diagnosis Date Noted  . Atherosclerosis of aorta (HCC) 03/26/2016  . DOE (dyspnea on exertion) 03/26/2016  . Tobacco abuse 03/26/2016  . Heart murmur 03/26/2016  . Hyperlipidemia with target LDL less than 70 03/26/2016  . Anemia 03/19/2016  . Symptomatic anemia 03/19/2016    History reviewed. No pertinent surgical history.     Home Medications    Prior to Admission medications   Medication Sig Start Date End Date Taking? Authorizing Provider  albuterol (PROVENTIL HFA;VENTOLIN HFA)  108 (90 Base) MCG/ACT inhaler Inhale 2 puffs into the lungs every 4 (four) hours as needed for wheezing or shortness of breath.   Yes [provider]  aspirin EC 81 MG tablet Take 1 tablet (81 mg total) by mouth daily. 03/26/16  Yes Turner, Cornelious Bryant, MD  ferrous sulfate 325 (65 FE) MG tablet Take 325 mg by mouth daily with breakfast.   Yes [provider]  HYDROcodone-acetaminophen (NORCO/VICODIN) 5-325 MG tablet Take 1 tablet by mouth every 6 (six) hours as needed for moderate pain.   Yes [provider]  Multiple Vitamin (MULTIVITAMIN) tablet Take 1 tablet by mouth daily.   Yes [provider]  ranitidine (ZANTAC) 150 MG capsule Take 150 mg by mouth 2 (two) times daily.   Yes [provider]  Sennosides (SENNA) 8.6 MG CAPS Take 8.6 mg by mouth daily as needed (constipation).   Yes [provider]    Family History Family History  Problem Relation Age of Onset  . Congestive Heart Failure Father     Social History Social History  Substance Use Topics  . Smoking status: Current Every Day Smoker    Packs/day: 1.00    Years: 30.00    Types: Cigarettes    Start date: 12/31/1963  . Smokeless tobacco: Never Used  . Alcohol use No     Allergies   Penicillins   Review of Systems  Review of Systems  Constitutional: Positive for fatigue. Negative for chills, diaphoresis and fever.  HENT: Negative for congestion, rhinorrhea and sneezing.   Eyes: Negative.   Respiratory: Negative for cough, chest tightness and shortness of breath.   Cardiovascular: Negative for chest pain and leg swelling.  Gastrointestinal: Negative for abdominal pain, blood in stool, diarrhea, nausea and vomiting.  Genitourinary: Negative for difficulty urinating, flank pain, frequency and hematuria.  Musculoskeletal: Negative for arthralgias and back pain.  Skin: Negative for rash.  Neurological: Positive for weakness (Generalized). Negative for dizziness, speech  difficulty, numbness and headaches.     Physical Exam Updated Vital Signs BP (!) 187/72   Pulse 83   Temp 98.4 F (36.9 C) (Oral)   Resp 19   Ht 5\' 10"  (1.778 m)   Wt 73.5 kg (162 lb)   SpO2 99%   BMI 23.24 kg/m   Physical Exam  Constitutional: He is oriented to person, place, and time. He appears well-developed and well-nourished.  HENT:  Head: Normocephalic and atraumatic.  Eyes: Conjunctivae are normal. Pupils are equal, round, and reactive to light.  Neck: Normal range of motion. Neck supple.  Cardiovascular: Normal rate, regular rhythm and normal heart sounds.   Pulmonary/Chest: Effort normal and breath sounds normal. No respiratory distress. He has no wheezes. He has no rales. He exhibits no tenderness.  Abdominal: Soft. Bowel sounds are normal. There is no tenderness. There is no rebound and no guarding.  Musculoskeletal: Normal range of motion. He exhibits no edema.  Lymphadenopathy:    He has no cervical adenopathy.  Neurological: He is alert and oriented to person, place, and time.  Skin: Skin is warm and dry. No rash noted.  Psychiatric: He has a normal mood and affect.     ED Treatments / Results  Labs (all labs ordered are listed, but only abnormal results are displayed) Labs Reviewed  COMPREHENSIVE METABOLIC PANEL - Abnormal; Notable for the following:       Result Value   BUN <5 (*)    ALT 13 (*)    All other components within normal limits  CBC    EKG  EKG Interpretation  Date/Time:  Sunday June 22 2017 18:32:19 EDT Ventricular Rate:  78 PR Interval:    QRS Duration: 100 QT Interval:  394 QTC Calculation: 449 R Axis:   80 Text Interpretation:  Sinus rhythm since last tracing no significant change Confirmed by Rolan Bucco [phone]) on 06/22/2017 6:37:07 PM       Radiology No results found.  Procedures Procedures (including critical care time)  Medications Ordered in ED Medications - No data to display   Initial Impression /  Assessment and Plan / ED Course  I have reviewed the triage vital signs and the nursing notes.  Pertinent labs & imaging results that were available during my care of the patient were reviewed by me and considered in my medical decision making (see chart for details).     Patient presents with a episode of weakness lasted about 15-20 minutes this morning. He states he feels completely back to baseline now. He was concerned that his hemoglobin was low at he's had similar symptoms in the past with low hemoglobin. His labs are normal in the ED today. His hemoglobin is normal. He currently is asymptomatic. He doesn't have any symptoms that would be more consistent with acute coronary syndrome. No fever or signs of infection. He was discharged home in good condition. He was encouraged to follow-up with  his PCP if his symptoms continue. Also needs to have his blood pressure rechecked by his PCP.  Final Clinical Impressions(s) / ED Diagnoses   Final diagnoses:  Weakness    New Prescriptions New Prescriptions   No medications on file     Rolan BuccoBelfi, Lynnetta Tom, MD 06/22/17 1919

## 2017-06-22 NOTE — ED Notes (Signed)
Pt requesting update on plan stating he is ready to go home

## 2017-11-04 ENCOUNTER — Other Ambulatory Visit: Payer: Self-pay | Admitting: *Deleted

## 2017-11-04 NOTE — Patient Outreach (Signed)
[employer] [facility] attempted. Mr. Jonathan Holland did not want to talk with me today. He did not let me fully explain the reason for my call which was to encourage him to see his primary care provider for an annual wellness visit and to let him know about [facility]. He also emphatically asked me NOT to call again or to NOT send him any information.  I will advise his primary care provider on record.  Zara Councilarroll C. Burgess EstelleSpinks, MSN, [facility]NP-BC Gerontological Nurse Practitioner [facility] [phone]

## 2018-07-29 ENCOUNTER — Emergency Department (HOSPITAL_COMMUNITY)
Admission: EM | Admit: 2018-07-29 | Discharge: 2018-07-29 | Disposition: A | Discharge status: Home / Self Care | Payer: Medicare HMO | Attending: Emergency Medicine | Admitting: Emergency Medicine

## 2018-07-29 ENCOUNTER — Other Ambulatory Visit: Payer: Self-pay

## 2018-07-29 ENCOUNTER — Emergency Department (HOSPITAL_COMMUNITY): Payer: Medicare HMO

## 2018-07-29 ENCOUNTER — Encounter (HOSPITAL_COMMUNITY): Payer: Self-pay

## 2018-07-29 DIAGNOSIS — R0602 Shortness of breath: Secondary | ICD-10-CM | POA: Diagnosis not present

## 2018-07-29 DIAGNOSIS — D649 Anemia, unspecified: Secondary | ICD-10-CM | POA: Insufficient documentation

## 2018-07-29 DIAGNOSIS — I10 Essential (primary) hypertension: Secondary | ICD-10-CM | POA: Insufficient documentation

## 2018-07-29 DIAGNOSIS — F17210 Nicotine dependence, cigarettes, uncomplicated: Secondary | ICD-10-CM | POA: Insufficient documentation

## 2018-07-29 DIAGNOSIS — R531 Weakness: Secondary | ICD-10-CM | POA: Diagnosis not present

## 2018-07-29 DIAGNOSIS — Z79899 Other long term (current) drug therapy: Secondary | ICD-10-CM | POA: Insufficient documentation

## 2018-07-29 LAB — URINALYSIS, ROUTINE W REFLEX MICROSCOPIC
BACTERIA UA: NONE SEEN
Bilirubin Urine: NEGATIVE
GLUCOSE, UA: NEGATIVE mg/dL
KETONES UR: NEGATIVE mg/dL
Leukocytes, UA: NEGATIVE
Nitrite: NEGATIVE
PROTEIN: NEGATIVE mg/dL
Specific Gravity, Urine: 1.005 (ref 1.005–1.030)
pH: 5 (ref 5.0–8.0)

## 2018-07-29 LAB — COMPREHENSIVE METABOLIC PANEL
ALT: 14 U/L (ref 0–44)
AST: 20 U/L (ref 15–41)
Albumin: 3.6 g/dL (ref 3.5–5.0)
Alkaline Phosphatase: 65 U/L (ref 38–126)
Anion gap: 8 (ref 5–15)
BUN: 10 mg/dL (ref 8–23)
CO2: 25 mmol/L (ref 22–32)
CREATININE: 0.95 mg/dL (ref 0.61–1.24)
Calcium: 8.6 mg/dL — ABNORMAL LOW (ref 8.9–10.3)
Chloride: 105 mmol/L (ref 98–111)
Glucose, Bld: 121 mg/dL — ABNORMAL HIGH (ref 70–99)
POTASSIUM: 3.6 mmol/L (ref 3.5–5.1)
Sodium: 138 mmol/L (ref 135–145)
Total Bilirubin: 0.4 mg/dL (ref 0.3–1.2)
Total Protein: 6.2 g/dL — ABNORMAL LOW (ref 6.5–8.1)

## 2018-07-29 LAB — CBC WITH DIFFERENTIAL/PLATELET
BASOS ABS: 0 10*3/uL (ref 0.0–0.1)
Basophils Relative: 1 %
EOS ABS: 0 10*3/uL (ref 0.0–0.7)
Eosinophils Relative: 0 %
HCT: 21.5 % — ABNORMAL LOW (ref 39.0–52.0)
HEMOGLOBIN: 6.9 g/dL — AB (ref 13.0–17.0)
LYMPHS ABS: 0.6 10*3/uL — AB (ref 0.7–4.0)
Lymphocytes Relative: 14 %
MCH: 30.7 pg (ref 26.0–34.0)
MCHC: 32.1 g/dL (ref 30.0–36.0)
MCV: 95.6 fL (ref 78.0–100.0)
Monocytes Absolute: 0.4 10*3/uL (ref 0.1–1.0)
Monocytes Relative: 9 %
NEUTROS PCT: 76 %
Neutro Abs: 3.6 10*3/uL (ref 1.7–7.7)
Platelets: 179 10*3/uL (ref 150–400)
RBC: 2.25 MIL/uL — AB (ref 4.22–5.81)
RDW: 16.1 % — ABNORMAL HIGH (ref 11.5–15.5)
WBC: 4.6 10*3/uL (ref 4.0–10.5)

## 2018-07-29 LAB — PREPARE RBC (CROSSMATCH)

## 2018-07-29 LAB — ABO/RH: ABO/RH(D): O POS

## 2018-07-29 MED ORDER — SODIUM CHLORIDE 0.9 % IV SOLN
10.0000 mL/h | Freq: Once | INTRAVENOUS | Status: AC
Start: 2018-07-29 — End: 2018-07-29
  Administered 2018-07-29: 10 mL/h via INTRAVENOUS

## 2018-07-29 NOTE — ED Notes (Signed)
CRITICAL VALUE ALERT  Critical Value:  Hgb 6.9  Date & Time Notied:  07/29/18, 0822  Provider Notified: Dr. Jeraldine LootsLockwood  Orders Received/Actions taken: no new orders at this time

## 2018-07-29 NOTE — ED Provider Notes (Signed)
[facility] EMERGENCY DEPARTMENT Provider Note   CSN: [id number] Arrival date & time: 07/29/18  4098     History   Chief Complaint Chief Complaint  Patient presents with  . Weakness    HPI Jonathan Holland is a 72 y.o. male.  HPI Presents with concern of weakness. Patient has history of prior similar episodes, has previously been told he had anemia, but denies any ongoing bloody stool, hematemesis, or bleeding anywhere. With past week he has had progressive weakness, particularly with exertion, but without new dyspnea, without any chest pain, without syncope. Patient knows his long history of smoking, states that he is trying to quit, is smoking only 2 cigarettes daily. He has a history of CAD, PVD, hypertension, receives care at the [facility]. During this illness no clear alleviating or exacerbating factors. Past Medical History:  Diagnosis Date  . Anemia   . Atherosclerosis of aorta (HCC) 03/26/2016  . Chronic pain   . Heart murmur   . Heart murmur 03/26/2016  . Hyperlipidemia   . Hyperlipidemia with target LDL less than 70 03/26/2016  . Hypertension   . PTSD (post-traumatic stress disorder)   . SOB (shortness of breath)   . Tobacco abuse 03/26/2016    Patient Active Problem List   Diagnosis Date Noted  . Atherosclerosis of aorta (HCC) 03/26/2016  . DOE (dyspnea on exertion) 03/26/2016  . Tobacco abuse 03/26/2016  . Heart murmur 03/26/2016  . Hyperlipidemia with target LDL less than 70 03/26/2016  . Anemia 03/19/2016  . Symptomatic anemia 03/19/2016    History reviewed. No pertinent surgical history.      Home Medications    Prior to Admission medications   Medication Sig Start Date End Date Taking? Authorizing Provider  albuterol (PROVENTIL HFA;VENTOLIN HFA) 108 (90 Base) MCG/ACT inhaler Inhale 2 puffs into the lungs every 4 (four) hours as needed for wheezing or shortness of breath.   Yes [provider]  ferrous sulfate 325 (65 FE)  MG tablet Take 325 mg by mouth daily with breakfast.   Yes [provider]  HYDROcodone-acetaminophen (NORCO/VICODIN) 5-325 MG tablet Take 1 tablet by mouth every 6 (six) hours as needed for moderate pain.   Yes [provider]  losartan (COZAAR) 25 MG tablet Take 25 mg by mouth daily.   Yes [provider]  ranitidine (ZANTAC) 150 MG capsule Take 150 mg by mouth 2 (two) times daily.   Yes [provider]  vitamin C (ASCORBIC ACID) 500 MG tablet Take 500 mg by mouth daily.   Yes [provider]  Multiple Vitamin (MULTIVITAMIN) tablet Take 1 tablet by mouth daily.    [provider]    Family History Family History  Problem Relation Age of Onset  . Congestive Heart Failure Father     Social History Social History   Tobacco Use  . Smoking status: Current Every Day Smoker    Packs/day: 0.50    Years: 30.00    Pack years: 15.00    Types: Cigarettes    Start date: 12/31/1963  . Smokeless tobacco: Never Used  . Tobacco comment: 2 cigarettes per day  Substance Use Topics  . Alcohol use: No    Alcohol/week: 0.0 oz  . Drug use: No     Allergies   Penicillins   Review of Systems Review of Systems  Constitutional:       Per HPI, otherwise negative  HENT:       Per HPI, otherwise negative  Respiratory:       Per HPI, otherwise negative  Cardiovascular:       Per HPI, otherwise negative  Gastrointestinal: Negative for vomiting.  Endocrine:       Negative aside from HPI  Genitourinary:       Neg aside from HPI   Musculoskeletal:       Per HPI, otherwise negative  Skin: Negative.   Neurological: Positive for weakness. Negative for syncope.     Physical Exam Updated Vital Signs BP (!) 113/53   Pulse 77   Temp 97.8 F (36.6 C) (Oral)   Resp 18   Ht 5\' 9"  (1.753 m)   Wt 67.6 kg (149 lb)   SpO2 100%   BMI 22.00 kg/m   Physical Exam  Constitutional: He is oriented to person, place, and time. He appears  well-developed. No distress.  HENT:  Head: Normocephalic and atraumatic.  Eyes: Conjunctivae and EOM are normal.  Cardiovascular: Normal rate and regular rhythm.  Pulmonary/Chest: Effort normal. No stridor. No respiratory distress.  Abdominal: He exhibits no distension.  Musculoskeletal: He exhibits no edema.  Neurological: He is alert and oriented to person, place, and time.  Skin: Skin is warm and dry.  Psychiatric: He has a normal mood and affect.  Nursing note and vitals reviewed.    ED Treatments / Results  Labs (all labs ordered are listed, but only abnormal results are displayed) Labs Reviewed  COMPREHENSIVE METABOLIC PANEL - Abnormal; Notable for the following components:      Result Value   Glucose, Bld 121 (*)    Calcium 8.6 (*)    Total Protein 6.2 (*)    All other components within normal limits  CBC WITH DIFFERENTIAL/PLATELET - Abnormal; Notable for the following components:   RBC 2.25 (*)    Hemoglobin 6.9 (*)    HCT 21.5 (*)    RDW 16.1 (*)    Lymphs Abs 0.6 (*)    All other components within normal limits  URINALYSIS, ROUTINE W REFLEX MICROSCOPIC - Abnormal; Notable for the following components:   Color, Urine STRAW (*)    Hgb urine dipstick SMALL (*)    All other components within normal limits  TYPE AND SCREEN  PREPARE RBC (CROSSMATCH)  ABO/RH    EKG EKG Interpretation  Date/Time:  Wednesday July 29 2018 07:30:22 EDT Ventricular Rate:  86 PR Interval:    QRS Duration: 94 QT Interval:  407 QTC Calculation: 487 R Axis:   78 Text Interpretation:  Sinus rhythm Atrial premature complex ST-t wave abnormality No significant change since last tracing Abnormal ekg Confirmed by Gerhard MunchLockwood, Tienna Bienkowski [phone] on 07/29/2018 8:13:58 AM   Radiology Dg Chest 2 View  Result Date: 07/29/2018 CLINICAL DATA:  Shortness of breath, weakness, heart murmur. EXAM: CHEST - 2 VIEW COMPARISON:  03/18/2016 FINDINGS: Cardiomediastinal silhouette is normal. Mediastinal contours  appear intact. Calcific atherosclerotic disease of the aorta. There is no evidence of focal airspace consolidation, pleural effusion or pneumothorax. Mildly spiculated opacity overlies the right mid hemithorax, seen on the frontal view. Osseous structures are without acute abnormality. Soft tissues are grossly normal. IMPRESSION: Mildly spiculated opacity overlies the right mid hemithorax. This may represent overlapping vascular structures, however a pulmonary nodule cannot be excluded. Further evaluation with nonemergent noncontrasted chest CT may be considered when clinically feasible. Calcific atherosclerotic disease of the aorta. Electronically Signed   By: Ted Mcalpineobrinka  Dimitrova M.D.   On: 07/29/2018 08:29    Procedures Procedures (including critical care time)  Medications Ordered in ED Medications  0.9 %  sodium chloride infusion (10 mL/hr Intravenous New Bag/Given 07/29/18 1047)     Initial Impression / Assessment and Plan / ED Course  I have reviewed the triage vital signs and the nursing notes.  Pertinent labs & imaging results that were available during my care of the patient were reviewed by me and considered in my medical decision making (see chart for details).     After the initial evaluation we discussed the patient's history including symptomatic anemia, he states that he has had a thorough evaluation including what sounds like nuclear red blood cell testing, endoscopy, capsule endoscopy, colonoscopy, without clear diagnosis. He notes that he receives much of his care at the [facility].    1:09 PM Patient has completed 1 unit of blood transfusion, states that he is feeling better already, standing upright, tapping his toes. He remains hemodynamically unremarkable. We discussed the importance of following up with GI, primary care for further evaluation.  After completion of second unit transfusion, the patient will be appropriate for discharge, barring notable  changes.   This male presents with weakness. He has a history of symptomatic anemia with unclear etiology. He is a former smoker, has some chronic illnesses, but no obvious source of bleeding historically, nor today. Patient is awake, alert, but with hemoglobin less than 7 patient received blood transfusion. This was well-tolerated. Patient will be discharged, unless notable events occur, with outpatient GI and primary care follow-up.  Final Clinical Impressions(s) / ED Diagnoses  Symptomatic anemia requiring transfusion  CRITICAL CARE Performed by: Gerhard Munch Total critical care time: 35 minutes Critical care time was exclusive of separately billable procedures and treating other patients. Critical care was necessary to treat or prevent imminent or life-threatening deterioration. Critical care was time spent personally by me on the following activities: development of treatment plan with patient and/or surrogate as well as nursing, discussions with consultants, evaluation of patient's response to treatment, examination of patient, obtaining history from patient or surrogate, ordering and performing treatments and interventions, ordering and review of laboratory studies, ordering and review of radiographic studies, pulse oximetry and re-evaluation of patient's condition.    Gerhard Munch, MD 07/29/18 [phone]

## 2018-07-29 NOTE — ED Notes (Signed)
Meal tray given 

## 2018-07-29 NOTE — Discharge Instructions (Signed)
As discussed, with today's exacerbation of your chronic anemia it is important he follow[facility] both [facility], as well as with our gastroenterology colleagues.  Return here for concerning changes in your condition.

## 2018-07-29 NOTE — ED Notes (Signed)
Patient transported to X-ray 

## 2018-07-29 NOTE — ED Triage Notes (Signed)
Pt reports that he has had generalized weakness for several days. Reports he has a hx of anemia and when his hgb gets low he gets weak

## 2018-07-30 LAB — TYPE AND SCREEN
ABO/RH(D): O POS
Antibody Screen: NEGATIVE
Unit division: 0
Unit division: 0

## 2018-07-30 LAB — BPAM RBC
BLOOD PRODUCT EXPIRATION DATE: 201909022359
Blood Product Expiration Date: 201909022359
ISSUE DATE / TIME: 201907311037
ISSUE DATE / TIME: 201907311255
UNIT TYPE AND RH: 5100
Unit Type and Rh: 5100

## 2018-08-18 DIAGNOSIS — Z720 Tobacco use: Secondary | ICD-10-CM | POA: Diagnosis not present

## 2018-08-18 DIAGNOSIS — D509 Iron deficiency anemia, unspecified: Secondary | ICD-10-CM | POA: Diagnosis not present

## 2018-08-18 DIAGNOSIS — R5383 Other fatigue: Secondary | ICD-10-CM | POA: Diagnosis not present

## 2018-08-18 DIAGNOSIS — R590 Localized enlarged lymph nodes: Secondary | ICD-10-CM | POA: Diagnosis not present

## 2018-08-26 DIAGNOSIS — D509 Iron deficiency anemia, unspecified: Secondary | ICD-10-CM | POA: Diagnosis not present

## 2018-09-11 DIAGNOSIS — R195 Other fecal abnormalities: Secondary | ICD-10-CM | POA: Diagnosis not present

## 2018-09-11 DIAGNOSIS — D509 Iron deficiency anemia, unspecified: Secondary | ICD-10-CM | POA: Diagnosis not present

## 2018-09-11 DIAGNOSIS — K5904 Chronic idiopathic constipation: Secondary | ICD-10-CM | POA: Diagnosis not present

## 2018-09-15 DIAGNOSIS — H938X1 Other specified disorders of right ear: Secondary | ICD-10-CM | POA: Diagnosis not present

## 2018-09-15 DIAGNOSIS — D500 Iron deficiency anemia secondary to blood loss (chronic): Secondary | ICD-10-CM | POA: Diagnosis not present

## 2018-09-15 DIAGNOSIS — K5904 Chronic idiopathic constipation: Secondary | ICD-10-CM | POA: Diagnosis not present

## 2018-09-15 DIAGNOSIS — Z23 Encounter for immunization: Secondary | ICD-10-CM | POA: Diagnosis not present

## 2018-10-28 DIAGNOSIS — I350 Nonrheumatic aortic (valve) stenosis: Secondary | ICD-10-CM | POA: Insufficient documentation

## 2018-10-29 HISTORY — PX: CARDIAC CATHETERIZATION: SHX172

## 2018-10-30 DIAGNOSIS — I2510 Atherosclerotic heart disease of native coronary artery without angina pectoris: Secondary | ICD-10-CM | POA: Insufficient documentation

## 2018-10-30 DIAGNOSIS — E538 Deficiency of other specified B group vitamins: Secondary | ICD-10-CM | POA: Insufficient documentation

## 2018-11-04 DIAGNOSIS — I350 Nonrheumatic aortic (valve) stenosis: Secondary | ICD-10-CM | POA: Diagnosis not present

## 2018-11-04 DIAGNOSIS — E785 Hyperlipidemia, unspecified: Secondary | ICD-10-CM | POA: Diagnosis not present

## 2018-11-04 DIAGNOSIS — D509 Iron deficiency anemia, unspecified: Secondary | ICD-10-CM | POA: Diagnosis not present

## 2018-11-04 DIAGNOSIS — E538 Deficiency of other specified B group vitamins: Secondary | ICD-10-CM | POA: Diagnosis not present

## 2018-11-04 DIAGNOSIS — R030 Elevated blood-pressure reading, without diagnosis of hypertension: Secondary | ICD-10-CM | POA: Diagnosis not present

## 2018-11-11 ENCOUNTER — Other Ambulatory Visit: Payer: Self-pay

## 2018-11-12 ENCOUNTER — Institutional Professional Consult (permissible substitution): Payer: Medicare HMO | Admitting: Thoracic Surgery (Cardiothoracic Vascular Surgery)

## 2018-11-12 ENCOUNTER — Other Ambulatory Visit: Payer: Self-pay

## 2018-11-12 ENCOUNTER — Other Ambulatory Visit: Payer: Self-pay | Admitting: *Deleted

## 2018-11-12 ENCOUNTER — Encounter: Payer: Self-pay | Admitting: Thoracic Surgery (Cardiothoracic Vascular Surgery)

## 2018-11-12 VITALS — BP 127/67 | HR 78 | Resp 20 | Ht 70.0 in | Wt 156.0 lb

## 2018-11-12 DIAGNOSIS — I350 Nonrheumatic aortic (valve) stenosis: Secondary | ICD-10-CM

## 2018-11-12 DIAGNOSIS — I25119 Atherosclerotic heart disease of native coronary artery with unspecified angina pectoris: Secondary | ICD-10-CM

## 2018-11-12 DIAGNOSIS — Z01818 Encounter for other preprocedural examination: Secondary | ICD-10-CM

## 2018-11-12 DIAGNOSIS — I712 Thoracic aortic aneurysm, without rupture, unspecified: Secondary | ICD-10-CM

## 2018-11-12 DIAGNOSIS — I2510 Atherosclerotic heart disease of native coronary artery without angina pectoris: Secondary | ICD-10-CM

## 2018-11-12 NOTE — Patient Instructions (Signed)
Continue all previous medications without any changes at this time  

## 2018-11-12 NOTE — Progress Notes (Signed)
exc

## 2018-11-12 NOTE — Progress Notes (Signed)
[facility]  MULTIDISCIPLINARY HEART VALVE CLINIC  CARDIOTHORACIC SURGERY CONSULTATION REPORT  Referring Provider is Christen BameKoirala, Ashish, MD PCP is Farris HasMorrow, Aaron, MD  Chief Complaint  Patient presents with  . 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  Patient states he has known of presence of a heart murmur for several years.  He gets most of his medical care at the [facility] in [place].  He has history of chronic anemia with multiple previous visits to the emergency room for generalized weakness associated with symptomatic anemia.  He has never had any clinical GI bleeding episodes, but intermittently in the past occult testing of the stool has demonstrated the presence of Hemoccult blood.  He reportedly has undergone upper GI endoscopy and colonoscopy in the distant past, although the patient states that he has not had endoscopy for several years because he is "allergic" to the oral cathartics utilized for colonoscopy.  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  The patient reports that over the last several months he has had rapid progression of symptoms of exertional shortness of breath and generalized pain across his chest with profound decreased energy and generalized weakness.  He states that he has gotten to the point where he really cannot do any sort of sustained physical activity without getting short of breath or tired.  He states that  symptoms of shortness of breath and chest discomfort always relieved within a few minutes of rest.  He has not had any rest pain or shortness of breath.  He denies any history of PND, orthopnea, dizzy spells, or syncope.  However, he states that when his pain and shortness of breath gets severe he feels as though he is going to pass out.  He has developed some mild lower extremity edema.  The patient was seen in follow-up recently by his primary care physician at the [facility] and underwent an echocardiogram that revealed severe aortic stenosis.  He was [facility] where he underwent echocardiogram and diagnostic cardiac catheterization on October 30, 2018.  By report transthoracic echocardiogram demonstrated normal left ventricular size with mild left ventricular systolic dysfunction.  There was reportedly hypokinesis or akinesis in the distal anterior wall and apex.  There was severe aortic stenosis reported with peak velocity across the aortic valve measured 4.3 m/s corresponding to mean transvalvular gradient estimated 46 mmHg.  The aortic valve area was calculated 0.64 cm.  There was normal right ventricular size and function, normal right atrial size, normal left atrial size, and no other significant valvular pathology.  Diagnostic cardiac catheterization was performed and reported to demonstrate multivessel coronary artery disease with high-grade 90% stenosis of the mid left anterior descending coronary artery.  There is 50% stenosis in the mid right coronary artery and 30 to 50% stenosis in the left circumflex coronary artery.  Pulmonary artery pressures were mildly elevated.  Further work-up for possible surgical aortic valve replacement with coronary artery bypass grafting versus transcatheter aortic  valve replacement and PCI was recommended.  The patient was unhappy with the care he received in [place] and elected to defer work-up at that  time.  He was self-referred to our office today for a second opinion.  The patient is single and lives locally in [place].  He has been widowed for 9 years and retired for longer than that.  He remains active, enjoying wood carving.  He drives a mobile and does all ordinary chores around his home.  He states that he has not been limited physically until recently.  He does not exercise on a regular basis, but he has not had any significant restrictions until the last several months.  He has a long history of tobacco abuse although he quit smoking in September as his problems with shortness of breath increased.  He has 1 adult daughter who lives in [place] and is scheduled to travel home to [place] for a visit next week.  Past Medical History:  Diagnosis Date  . Anemia   . Atherosclerosis of aorta (HCC) 03/26/2016  . Chronic pain   . Heart murmur   . Heart murmur 03/26/2016  . Hyperlipidemia   . Hyperlipidemia with target LDL less than 70 03/26/2016  . Hypertension   . PTSD (post-traumatic stress disorder)   . SOB (shortness of breath)   . Tobacco abuse 03/26/2016    Past Surgical History:  Procedure Laterality Date  .  tonsilectomy as a child    . ADENOIDECTOMY    . CARDIAC CATHETERIZATION  10/29/2018   done at [facility]  . HERNIA REPAIR    . SHOULDER ARTHROSCOPY      Family History  Problem Relation Age of Onset  . Congestive Heart Failure Father   . Hyperlipidemia Father   . Hypertension Father   . Heart disease Father     Social History   Socioeconomic History  . Marital status: Widowed    Spouse name: Not on file  . Number of children: Not on file  . Years of education: Not on file  . Highest education level: Not on file  Occupational History  . Not on file  Social Needs  . Financial resource strain: Not on file  . Food insecurity:    Worry: Not on file    Inability: Not on file  . Transportation needs:    Medical: Not on file    Non-medical: Not on  file  Tobacco Use  . Smoking status: Current Every Day Smoker    Packs/day: 0.50    Years: 30.00    Pack years: 15.00    Types: Cigarettes    Start date: 12/31/1963  . Smokeless tobacco: Never Used  . Tobacco comment: 2 cigarettes per day  Substance and Sexual Activity  . Alcohol use: No    Alcohol/week: 0.0 standard drinks  . Drug use: No  . Sexual activity: Not Currently  Lifestyle  . Physical activity:    Days per week: Not on file    Minutes per session: Not on file  . Stress: Not on file  Relationships  . Social connections:    Talks on phone: Not on file    Gets together: Not on file    Attends religious service: Not on file    Active member of club or organization: Not on file    Attends meetings of clubs or organizations: Not on file    Relationship status: Not on file  . Intimate partner violence:  Fear of current or ex partner: Not on file    Emotionally abused: Not on file    Physically abused: Not on file    Forced sexual activity: Not on file  Other Topics Concern  . Not on file  Social History Narrative   Disabled/retired, widow   Lives alone     Current Outpatient Medications  Medication Sig Dispense Refill  . albuterol (PROVENTIL HFA;VENTOLIN HFA) 108 (90 Base) MCG/ACT inhaler Inhale 2 puffs into the lungs every 4 (four) hours as needed for wheezing or shortness of breath.    Marland Kitchen aspirin EC 81 MG tablet Take by mouth.    Marland Kitchen atorvastatin (LIPITOR) 20 MG tablet Take by mouth.    . ferrous sulfate 325 (65 FE) MG tablet Take 1 tablet (325 mg total) by mouth 2 times daily with meals on every Monday, Wednesday, Friday    . HYDROcodone-acetaminophen (NORCO/VICODIN) 5-325 MG tablet Take 1 tablet by mouth every 6 (six) hours as needed for moderate pain.    . pantoprazole (PROTONIX) 40 MG tablet Take by mouth.    . tamsulosin (FLOMAX) 0.4 MG CAPS capsule Take by mouth.    . vitamin B-12 (CYANOCOBALAMIN) 1000 MCG tablet Take by mouth.    . vitamin C (ASCORBIC ACID)  500 MG tablet Take 500 mg by mouth daily.    . Multiple Vitamin (MULTIVITAMIN) tablet Take 1 tablet by mouth daily.     No current facility-administered medications for this visit.     Allergies  Allergen Reactions  . Penicillins Anaphylaxis    Has patient had a PCN reaction causing immediate rash, facial/tongue/throat swelling, SOB or lightheadedness with hypotension: Yes Has patient had a PCN reaction causing severe rash involving mucus membranes or skin necrosis: Yes Has patient had a PCN reaction that required hospitalization: Yes Has patient had a PCN reaction occurring within the last 10 years: No If all of the above answers are "NO", then may proceed with Cephalosporin use.   . Other Other (See Comments)    Medication used prior to colonoscopy- Pt states it feels like "gasoline was poured on him", throat swelling      Review of Systems:   General:  normal appetite, decreased energy, no weight gain, no weight loss, no fever  Cardiac:  + chest pain with exertion, no chest pain at rest, + SOB with exertion, no resting SOB, no PND, no orthopnea, no palpitations, no arrhythmia, no atrial fibrillation, no LE edema, no dizzy spells, no syncope  Respiratory:  + shortness of breath, no home oxygen, no productive cough, no dry cough, no bronchitis, no wheezing, no hemoptysis, no asthma, no pain with inspiration or cough, no sleep apnea, no CPAP at night  GI:   no difficulty swallowing, no reflux, no frequent heartburn, no hiatal hernia, no abdominal pain, no constipation, no diarrhea, no hematochezia, no hematemesis, no melena  GU:   no dysuria,  no frequency, no urinary tract infection, no hematuria, + enlarged prostate, no kidney stones, no kidney disease  Vascular:  no pain suggestive of claudication, no pain in feet, no leg cramps, no varicose veins, no DVT, no non-healing foot ulcer  Neuro:   no stroke, no TIA's, no seizures, no headaches, no temporary blindness one eye,  no slurred  speech, no peripheral neuropathy, no chronic pain, no instability of gait, no memory/cognitive dysfunction  Musculoskeletal: + arthritis, no joint swelling, no myalgias, no difficulty walking, normal mobility   Skin:   no rash, no itching, no  skin infections, no pressure sores or ulcerations  Psych:   no anxiety, no depression, no nervousness, no unusual recent stress  Eyes:   no blurry vision, no floaters, no recent vision changes, does not wears glasses or contacts  ENT:   no hearing loss, no loose or painful teeth, edentulous w/out dentures, last saw dentist many years ago  Hematologic:  + easy bruising, no abnormal bleeding, no clotting disorder, no frequent epistaxis  Endocrine:  no diabetes, does not check CBG's at home           Physical Exam:   BP 127/67   Pulse 78   Resp 20   Ht 5\' 10"  (1.778 m)   Wt 156 lb (70.8 kg)   SpO2 98% Comment: RA  BMI 22.38 kg/m   General:  Thin,  well-appearing  HEENT:  Unremarkable   Neck:   no JVD, no bruits, no adenopathy   Chest:   clear to auscultation, symmetrical breath sounds, no wheezes, no rhonchi   CV:   RRR, grade IV/VI crescendo/decrescendo murmur heard best at RSB,  no diastolic murmur  Abdomen:  soft, non-tender, no masses   Extremities:  warm, well-perfused, pulses diminished, no LE edema  Rectal/GU  Deferred  Neuro:   Grossly non-focal and symmetrical throughout  Skin:   Clean and dry, no rashes, no breakdown   Diagnostic Tests:  TRANSTHORACIC ECHOCARDIOGRAM  Report from transthoracic echocardiogram performed October 30, 2018 at [facility] is reviewed.  Images from this examination are not currently available for review.  By report the patient had mild left ventricular systolic function with akinesis of the LAD territory involving the mid and apical segments.  Ejection fraction was estimated 45 to 50%.  There was severe aortic stenosis.  Peak velocity across the aortic valve was reported 4.3 m/s  corresponding to mean transvalvular gradient estimated 46 mmHg and valve area calculated 0.64 cm.   CARDIAC CATHETERIZATION  Both report and images from diagnostic cardiac catheterization performed October 29, 2018 [facility] are reviewed.  The patient has diffuse coronary artery disease.  There is high-grade 70 to 90% stenosis of the mid left anterior descending coronary artery.  There is 30 to 50% stenosis of left circumflex coronary artery with 50 to 70% stenosis of the ostial portion of the obtuse marginal branch.  There is 50% stenosis of the mid right coronary artery.  There is heavy calcification noted in the aortic valve and aortic annulus.  Right heart pressures were mildly elevated.  Transvalvular gradient was not assessed.   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  I have not had the opportunity to personally reviewed the patient's recent echocardiogram, but by report the patient has clear evidence for severe aortic stenosis with mild left ventricular systolic dysfunction.  Diagnostic cardiac catheterization revealed severe multivessel coronary artery disease including high-grade 70 to 90% stenosis of the mid left anterior descending coronary artery.  Options include conventional surgical aortic valve replacement and coronary artery bypass grafting versus PCI and stenting of the left anterior descending coronary artery with transcatheter aortic valve replacement.  Remainder of the patient's coronary artery disease could be managed medically.  Risks associated with conventional surgery should be relatively low.  The patient has coexisting long-standing history of chronic  anemia with recurrent episodes of occult  blood loss presumably related to AV malformations.  Long-term treatment with dual antiplatelet therapy could be problematic.  I favor proceeding with conventional surgical aortic valve replacement and coronary artery bypass grafting in the near future, although the patient might be at risk for further bleeding complications and will certainly need both upper GI endoscopy and colonoscopy performed at some point in the near future,    Plan:  The patient was counseled at length regarding treatment alternatives for management of severe symptomatic aortic stenosis and multivessel coronary artery disease. Alternative approaches such as conventional aortic valve replacement with CABG, transcatheter aortic valve replacement with PCI, and continued medical therapy without intervention were compared and contrasted at length.  The risks associated with conventional surgery were discussed in detail, as were expectations for post-operative convalescence.  Issues specific to transcatheter aortic valve replacement and PCI were discussed including the need for dual antiplatelet therapy, questions about long term valve durability, the potential for paravalvular leak, possible increased risk of need for permanent pacemaker placement, and other technical complications related to the procedures.  Long-term prognosis with medical therapy was discussed.  The need for formal GI evaluation to include upper GI endoscopy and colonoscopy was discussed.  Timing as to whether or not this could or should be performed prior to surgical intervention was discussed.  This discussion was placed in the context of the patient's own specific clinical presentation and past medical history.  All of their questions have been addressed.  The patient desires to proceed with aortic valve replacement and coronary artery bypass grafting as soon as possible.  We tentatively plan for surgery on November 19, 2018.    We will request that a copy of the  patient's recent echocardiogram be forwarded as soon as possible for our review.  If not available we will need to repeat an echocardiogram.  The patient will undergo cardiac gated CT angiogram of the heart and CT angiogram of the chest, abdomen, and pelvis to further complete his work-up for aortic stenosis.  He will undergo pulmonary function testing.  We will obtain additional blood work including anemia panel, haptoglobin level, and reticulocyte count with his routine preoperative blood work.  The patient will return to our office next week when his daughter is in town to review the results of these tests and make final plans for definitive surgical intervention.  All questions answered.   I spent in excess of 90 minutes during the conduct of this office consultation and >50% of this time involved direct face-to-face encounter with the patient for counseling and/or coordination of their care.     Salvatore Decent. Cornelius Moras, MD 11/12/2018 10:01 AM

## 2018-11-13 ENCOUNTER — Other Ambulatory Visit: Payer: Self-pay

## 2018-11-13 ENCOUNTER — Ambulatory Visit (HOSPITAL_COMMUNITY)
Admission: RE | Admit: 2018-11-13 | Discharge: 2018-11-13 | Disposition: A | Discharge status: Home / Self Care | Payer: No Typology Code available for payment source | Source: Ambulatory Visit | Attending: Thoracic Surgery (Cardiothoracic Vascular Surgery) | Admitting: Thoracic Surgery (Cardiothoracic Vascular Surgery)

## 2018-11-13 DIAGNOSIS — I350 Nonrheumatic aortic (valve) stenosis: Secondary | ICD-10-CM

## 2018-11-13 DIAGNOSIS — I2510 Atherosclerotic heart disease of native coronary artery without angina pectoris: Secondary | ICD-10-CM | POA: Diagnosis not present

## 2018-11-13 DIAGNOSIS — I700 Atherosclerosis of aorta: Secondary | ICD-10-CM | POA: Diagnosis not present

## 2018-11-13 DIAGNOSIS — I712 Thoracic aortic aneurysm, without rupture, unspecified: Secondary | ICD-10-CM

## 2018-11-13 DIAGNOSIS — N200 Calculus of kidney: Secondary | ICD-10-CM | POA: Insufficient documentation

## 2018-11-13 DIAGNOSIS — J90 Pleural effusion, not elsewhere classified: Secondary | ICD-10-CM | POA: Insufficient documentation

## 2018-11-13 DIAGNOSIS — Z01818 Encounter for other preprocedural examination: Secondary | ICD-10-CM | POA: Insufficient documentation

## 2018-11-13 LAB — POCT I-STAT CREATININE: CREATININE: 0.9 mg/dL (ref 0.61–1.24)

## 2018-11-13 MED ORDER — IOPAMIDOL (ISOVUE-370) INJECTION 76%
100.0000 mL | Freq: Once | INTRAVENOUS | Status: AC | PRN
  Administered 2018-11-13: 100 mL via INTRAVENOUS

## 2018-11-13 NOTE — Pre-Procedure Instructions (Signed)
Pamala HurryJohnny W Uecker  11/13/2018      [facility] [address] - [place], [place] - [address] [place] [address] Phone: [phone] Fax: [phone]  [name] [place] - [address] [place] [address] Phone: [phone] Fax: [phone]    Your procedure is scheduled on Thursday November 21.  [facility] at 5:30 A.M.  Call this number if you have problems the morning of surgery:  [phone]   Remember:  Do not eat or drink after midnight.    Take these medicines the morning of surgery with A SIP OF WATER:   Pantoprazole (protonix) Tamsulosin (Flomax) Hydrocodone if needed Albuterol if needed  7 days prior to surgery STOP taking any  Aleve, Naproxen, Ibuprofen, Motrin, Advil, Goody's, BC's, all herbal medications, fish oil, and all vitamins  Follow your surgeon's instructions on stopping Aspirin.      Do not wear jewelry  Do not wear lotions, powders, or colognes, or deodorant.  Do not shave 48 hours prior to surgery.  Men may shave face and neck.  Do not bring valuables to the hospital.  [facility] is not responsible for any belongings or valuables.  Contacts, dentures or bridgework may not be worn into surgery.  Leave your suitcase in the car.  After surgery it may be brought to your room.  For patients admitted to the hospital, discharge time will be determined by your treatment team.  Patients discharged the day of surgery will not be allowed to drive home.   Special instructions:    [facility] [place]- Preparing For Surgery  Before surgery, you can play an important role. Because skin is not sterile, your skin needs to be as free of germs as possible. You can reduce the number of germs on your skin by washing with CHG (chlorahexidine gluconate) Soap before surgery.  CHG is an antiseptic cleaner which kills germs and bonds with the skin to continue killing germs even  after washing.    Oral Hygiene is also important to reduce your risk of infection.  Remember - BRUSH YOUR TEETH THE MORNING OF SURGERY WITH YOUR REGULAR TOOTHPASTE  Please do not use if you have an allergy to CHG or antibacterial soaps. If your skin becomes reddened/irritated stop using the CHG.  Do not shave (including legs and underarms) for at least 48 hours prior to first CHG shower. It is OK to shave your face.  Please follow these instructions carefully.   1. Shower the NIGHT BEFORE SURGERY and the MORNING OF SURGERY with CHG.   2. If you chose to wash your hair, wash your hair first as usual with your normal shampoo.  3. After you shampoo, rinse your hair and body thoroughly to remove the shampoo.  4. Use CHG as you would any other liquid soap. You can apply CHG directly to the skin and wash gently with a scrungie or a clean washcloth.   5. Apply the CHG Soap to your body ONLY FROM THE NECK DOWN.  Do not use on open wounds or open sores. Avoid contact with your eyes, ears, mouth and genitals (private parts). Wash Face and genitals (private parts)  with your normal soap.  6. Wash thoroughly, paying special attention to the area where your surgery will be performed.  7. Thoroughly rinse your body with warm water from the neck down.  8. DO NOT shower/wash with your normal  soap after using and rinsing off the CHG Soap.  9. Pat yourself dry with a CLEAN TOWEL.  10. Wear CLEAN PAJAMAS to bed the night before surgery, wear comfortable clothes the morning of surgery  11. Place CLEAN SHEETS on your bed the night of your first shower and DO NOT SLEEP WITH PETS.    Day of Surgery:  Do not apply any deodorants/lotions.  Please wear clean clothes to the hospital/surgery center.   Remember to brush your teeth WITH YOUR REGULAR TOOTHPASTE.    Please read over the following fact sheets that you were given. Coughing and Deep Breathing, MRSA Information and Surgical Site Infection  Prevention

## 2018-11-16 ENCOUNTER — Ambulatory Visit (HOSPITAL_COMMUNITY)
Admission: RE | Admit: 2018-11-16 | Discharge: 2018-11-16 | Disposition: A | Discharge status: Home / Self Care | Payer: No Typology Code available for payment source | Source: Ambulatory Visit | Attending: Thoracic Surgery (Cardiothoracic Vascular Surgery) | Admitting: Thoracic Surgery (Cardiothoracic Vascular Surgery)

## 2018-11-16 ENCOUNTER — Encounter (HOSPITAL_COMMUNITY)
Admission: RE | Admit: 2018-11-16 | Discharge: 2018-11-16 | Disposition: A | Discharge status: Home / Self Care | Payer: No Typology Code available for payment source | Source: Ambulatory Visit | Attending: Thoracic Surgery (Cardiothoracic Vascular Surgery) | Admitting: Thoracic Surgery (Cardiothoracic Vascular Surgery)

## 2018-11-16 ENCOUNTER — Other Ambulatory Visit: Payer: Self-pay

## 2018-11-16 ENCOUNTER — Encounter (HOSPITAL_COMMUNITY): Payer: Self-pay

## 2018-11-16 ENCOUNTER — Ambulatory Visit (HOSPITAL_BASED_OUTPATIENT_CLINIC_OR_DEPARTMENT_OTHER)
Admission: RE | Admit: 2018-11-16 | Discharge: 2018-11-16 | Disposition: A | Discharge status: Home / Self Care | Payer: No Typology Code available for payment source | Source: Ambulatory Visit | Attending: Thoracic Surgery (Cardiothoracic Vascular Surgery) | Admitting: Thoracic Surgery (Cardiothoracic Vascular Surgery)

## 2018-11-16 DIAGNOSIS — I6523 Occlusion and stenosis of bilateral carotid arteries: Secondary | ICD-10-CM | POA: Diagnosis not present

## 2018-11-16 DIAGNOSIS — I350 Nonrheumatic aortic (valve) stenosis: Secondary | ICD-10-CM | POA: Diagnosis not present

## 2018-11-16 DIAGNOSIS — I700 Atherosclerosis of aorta: Secondary | ICD-10-CM | POA: Diagnosis not present

## 2018-11-16 DIAGNOSIS — J449 Chronic obstructive pulmonary disease, unspecified: Secondary | ICD-10-CM | POA: Insufficient documentation

## 2018-11-16 DIAGNOSIS — J90 Pleural effusion, not elsewhere classified: Secondary | ICD-10-CM | POA: Insufficient documentation

## 2018-11-16 DIAGNOSIS — Z01818 Encounter for other preprocedural examination: Secondary | ICD-10-CM | POA: Insufficient documentation

## 2018-11-16 DIAGNOSIS — I2510 Atherosclerotic heart disease of native coronary artery without angina pectoris: Secondary | ICD-10-CM

## 2018-11-16 DIAGNOSIS — I25119 Atherosclerotic heart disease of native coronary artery with unspecified angina pectoris: Secondary | ICD-10-CM | POA: Diagnosis not present

## 2018-11-16 DIAGNOSIS — R9431 Abnormal electrocardiogram [ECG] [EKG]: Secondary | ICD-10-CM | POA: Diagnosis not present

## 2018-11-16 DIAGNOSIS — I083 Combined rheumatic disorders of mitral, aortic and tricuspid valves: Secondary | ICD-10-CM | POA: Diagnosis not present

## 2018-11-16 HISTORY — DX: Dyspnea, unspecified: R06.00

## 2018-11-16 HISTORY — DX: Unspecified osteoarthritis, unspecified site: M19.90

## 2018-11-16 HISTORY — DX: Gastro-esophageal reflux disease without esophagitis: K21.9

## 2018-11-16 HISTORY — DX: Chronic obstructive pulmonary disease, unspecified: J44.9

## 2018-11-16 LAB — PULMONARY FUNCTION TEST
DL/VA % pred: 93 %
DL/VA: 4.35 ml/min/mmHg/L
DLCO COR % PRED: 73 %
DLCO COR: 24.81 ml/min/mmHg
DLCO UNC: 19.15 ml/min/mmHg
DLCO unc % pred: 56 %
FEF 25-75 Post: 1.67 L/sec
FEF 25-75 Pre: 1.08 L/sec
FEF2575-%CHANGE-POST: 55 %
FEF2575-%PRED-PRE: 41 %
FEF2575-%Pred-Post: 63 %
FEV1-%CHANGE-POST: 14 %
FEV1-%PRED-POST: 70 %
FEV1-%Pred-Pre: 61 %
FEV1-POST: 2.42 L
FEV1-Pre: 2.11 L
FEV1FVC-%CHANGE-POST: 0 %
FEV1FVC-%Pred-Pre: 84 %
FEV6-%Change-Post: 11 %
FEV6-%PRED-PRE: 73 %
FEV6-%Pred-Post: 82 %
FEV6-POST: 3.62 L
FEV6-PRE: 3.23 L
FEV6FVC-%Change-Post: -2 %
FEV6FVC-%PRED-POST: 97 %
FEV6FVC-%PRED-PRE: 100 %
FVC-%Change-Post: 15 %
FVC-%PRED-POST: 84 %
FVC-%PRED-PRE: 73 %
FVC-POST: 3.91 L
FVC-PRE: 3.39 L
POST FEV6/FVC RATIO: 92 %
PRE FEV6/FVC RATIO: 95 %
Post FEV1/FVC ratio: 62 %
Pre FEV1/FVC ratio: 62 %

## 2018-11-16 LAB — PROTIME-INR
INR: 1.08
PROTHROMBIN TIME: 13.9 s (ref 11.4–15.2)

## 2018-11-16 LAB — BLOOD GAS, ARTERIAL
Acid-Base Excess: 2.3 mmol/L — ABNORMAL HIGH (ref 0.0–2.0)
BICARBONATE: 26 mmol/L (ref 20.0–28.0)
Drawn by: 449841
FIO2: 21
O2 Saturation: 100 %
PH ART: 7.452 — AB (ref 7.350–7.450)
Patient temperature: 98.6
pCO2 arterial: 37.8 mmHg (ref 32.0–48.0)
pO2, Arterial: 137 mmHg — ABNORMAL HIGH (ref 83.0–108.0)

## 2018-11-16 LAB — COMPREHENSIVE METABOLIC PANEL
ALT: 12 U/L (ref 0–44)
ANION GAP: 6 (ref 5–15)
AST: 16 U/L (ref 15–41)
Albumin: 3.5 g/dL (ref 3.5–5.0)
Alkaline Phosphatase: 50 U/L (ref 38–126)
BUN: 6 mg/dL — ABNORMAL LOW (ref 8–23)
CHLORIDE: 109 mmol/L (ref 98–111)
CO2: 25 mmol/L (ref 22–32)
CREATININE: 1.01 mg/dL (ref 0.61–1.24)
Calcium: 8.8 mg/dL — ABNORMAL LOW (ref 8.9–10.3)
Glucose, Bld: 114 mg/dL — ABNORMAL HIGH (ref 70–99)
Potassium: 3.4 mmol/L — ABNORMAL LOW (ref 3.5–5.1)
Sodium: 140 mmol/L (ref 135–145)
Total Bilirubin: 0.4 mg/dL (ref 0.3–1.2)
Total Protein: 5.9 g/dL — ABNORMAL LOW (ref 6.5–8.1)

## 2018-11-16 LAB — HEMOGLOBIN A1C

## 2018-11-16 LAB — IRON AND TIBC
Iron: 226 ug/dL — ABNORMAL HIGH (ref 45–182)
SATURATION RATIOS: 64 % — AB (ref 17.9–39.5)
TIBC: 354 ug/dL (ref 250–450)
UIBC: 128 ug/dL

## 2018-11-16 LAB — URINALYSIS, ROUTINE W REFLEX MICROSCOPIC
Bilirubin Urine: NEGATIVE
Glucose, UA: NEGATIVE mg/dL
HGB URINE DIPSTICK: NEGATIVE
Ketones, ur: NEGATIVE mg/dL
Leukocytes, UA: NEGATIVE
NITRITE: NEGATIVE
Protein, ur: NEGATIVE mg/dL
SPECIFIC GRAVITY, URINE: 1.01 (ref 1.005–1.030)
pH: 5 (ref 5.0–8.0)

## 2018-11-16 LAB — CBC
HCT: 29.3 % — ABNORMAL LOW (ref 39.0–52.0)
HEMOGLOBIN: 8.5 g/dL — AB (ref 13.0–17.0)
MCH: 28.1 pg (ref 26.0–34.0)
MCHC: 29 g/dL — AB (ref 30.0–36.0)
MCV: 96.7 fL (ref 80.0–100.0)
NRBC: 0 % (ref 0.0–0.2)
PLATELETS: 176 10*3/uL (ref 150–400)
RBC: 3.03 MIL/uL — AB (ref 4.22–5.81)
RDW: 13.8 % (ref 11.5–15.5)
WBC: 3.4 10*3/uL — AB (ref 4.0–10.5)

## 2018-11-16 LAB — RETICULOCYTES
Immature Retic Fract: 18.9 % — ABNORMAL HIGH (ref 2.3–15.9)
RBC.: 3.03 MIL/uL — ABNORMAL LOW (ref 4.22–5.81)
RETIC COUNT ABSOLUTE: 97 10*3/uL (ref 19.0–186.0)
Retic Ct Pct: 3.2 % — ABNORMAL HIGH (ref 0.4–3.1)

## 2018-11-16 LAB — ECHOCARDIOGRAM COMPLETE
HEIGHTINCHES: 71 in
Weight: 2571.2 oz

## 2018-11-16 LAB — FOLATE: FOLATE: 12.8 ng/mL (ref >5.9)

## 2018-11-16 LAB — VITAMIN B12: Vitamin B-12: 779 pg/mL (ref 180–914)

## 2018-11-16 LAB — SURGICAL PCR SCREEN
MRSA, PCR: NEGATIVE
STAPHYLOCOCCUS AUREUS: POSITIVE — AB

## 2018-11-16 LAB — APTT: APTT: 34 s (ref 24–36)

## 2018-11-16 LAB — FERRITIN: Ferritin: 20 ng/mL — ABNORMAL LOW (ref 24–336)

## 2018-11-16 MED ORDER — ALBUTEROL SULFATE (2.5 MG/3ML) 0.083% IN NEBU
2.5000 mg | INHALATION_SOLUTION | Freq: Once | RESPIRATORY_TRACT | Status: AC
  Administered 2018-11-16: 2.5 mg via RESPIRATORY_TRACT

## 2018-11-16 NOTE — Progress Notes (Addendum)
Civilian PCP is Dr. Kateri PlummerMorrow  LOV 09/2018 [facility] [place] [facility], has pulmonologist, cardiologist, pcp. Did go see Dr. Kym Groom Turner back in 2017 Echo 10/2018 Heart Cath  10/2018 [facility] Dr. Barry Dieneswens office.  I have called there to request them. Also have Auth to release inside chart just in case. He has been instructed not to take aspirin dos per Darius Bumpyan Brooks, RN

## 2018-11-16 NOTE — Progress Notes (Signed)
  Echocardiogram 2D Echocardiogram has been performed.  Delcie RochENNINGTON, Lenice Koper 11/16/2018, 10:58 AM

## 2018-11-16 NOTE — Progress Notes (Signed)
Pre-op Cardiac Surgery  Carotid Findings:  Bilateral ICA's: 40-59% stenosis  Upper Extremity Right Left  Brachial Pressures 138 138  Radial Waveforms triphasic triphasic  Ulnar Waveforms triphasic triphasic  Palmar Arch (Allen's Test) Radial and ulnar: WNL Radial and Ulnar: WNL     Lower  Extremity Right Left  Dorsalis Pedis Triphasic triphasic      Posterior Tibial triphasic triphasic        Blanch MediaMegan Daymond Cordts 11/16/2018 11:47 AM

## 2018-11-16 NOTE — Pre-Procedure Instructions (Signed)
Jonathan Holland  11/16/2018      [facility] [address] - [place], [place] - [address] [place] [id number] Phone: [phone] Fax: [phone]  [name] - [place], [place] - [address] [place] [id number] Phone: [phone] Fax: [phone]    Your procedure is scheduled on Thursday November 21.   [facility] Ad[facility] at 5:30 A.M.             (posted surgery time 7:30a - 1:21p)   Call this number if you have problems the morning of surgery:  [phone]   Remember:  Do not eat any foods or drink any liquids after midnight, Wednesday.    Take these medicines the morning of surgery with A SIP OF WATER:   Pantoprazole (protonix) Tamsulosin (Flomax) Hydrocodone if needed Albuterol if needed  7 days prior to surgery STOP taking any  Aleve, Naproxen, Ibuprofen, Motrin, Advil, Goody's, BC's, all herbal medications, fish oil, and all vitamins  Follow your surgeon's instructions on stopping Aspirin.      Do not wear jewelry  Do not wear lotions, colognes, or deodorant.             Men may shave face and neck.  Do not bring valuables to the hospital.  [facility] any belongings or valuables.  Contacts, dentures or bridgework may not be worn into surgery.  Leave your suitcase in the car.  After surgery it may be brought to your room.  For patients admitted to the hospital, discharge time will be determined by your treatment team.    Special instructions:    [facility]- Preparing For Surgery  Before surgery, you can play an important role. Because skin is not sterile, your skin needs to be as free of germs as possible. You can reduce the number of germs on your skin by washing with CHG (chlorahexidine gluconate) Soap before surgery.  CHG is an antiseptic cleaner which kills germs and bonds with the skin to continue killing germs even after washing.    Oral Hygiene  is also important to reduce your risk of infection.    Remember - BRUSH YOUR TEETH THE MORNING OF SURGERY WITH YOUR REGULAR TOOTHPASTE  Please do not use if you have an allergy to CHG or antibacterial soaps. If your skin becomes reddened/irritated stop using the CHG.  Do not shave (including legs and underarms) for at least 48 hours prior to first CHG shower. It is OK to shave your face.  Please follow these instructions carefully.   1. Shower the NIGHT BEFORE SURGERY and the MORNING OF SURGERY with CHG.   2. If you chose to wash your hair, wash your hair first as usual with your normal shampoo.  3. After you shampoo, rinse your hair and body thoroughly to remove the shampoo.  4. Use CHG as you would any other liquid soap. You can apply CHG directly to the skin and wash gently with a scrungie or a clean washcloth.   5. Apply the CHG Soap to your body ONLY FROM THE NECK DOWN.  Do not use on open wounds or open sores. Avoid contact with your eyes, ears, mouth and genitals (private parts). Wash Face and genitals (private parts)  with your normal soap.  6. Wash thoroughly, paying special attention to the area where your surgery will be performed.  7. Thoroughly rinse your body with  warm water from the neck down.  8. DO NOT shower/wash with your normal soap after using and rinsing off the CHG Soap.  9. Pat yourself dry with a CLEAN TOWEL.  10. Wear CLEAN PAJAMAS to bed the night before surgery, wear comfortable clothes the morning of surgery  11. Place CLEAN SHEETS on your bed the night of your first shower and DO NOT SLEEP WITH PETS.  Day of Surgery:  Do not apply any deodorants/lotions.  Please wear clean clothes to the hospital/surgery center.   Remember to brush your teeth WITH YOUR REGULAR TOOTHPASTE.   Please read over the following fact sheets that you were given. MRSA Information and Surgical Site Infection Prevention

## 2018-11-17 LAB — HAPTOGLOBIN: Haptoglobin: 102 mg/dL (ref 34–200)

## 2018-11-18 ENCOUNTER — Ambulatory Visit (INDEPENDENT_AMBULATORY_CARE_PROVIDER_SITE_OTHER): Payer: No Typology Code available for payment source | Admitting: Thoracic Surgery (Cardiothoracic Vascular Surgery)

## 2018-11-18 ENCOUNTER — Encounter: Payer: Self-pay | Admitting: Thoracic Surgery (Cardiothoracic Vascular Surgery)

## 2018-11-18 ENCOUNTER — Other Ambulatory Visit: Payer: Self-pay

## 2018-11-18 VITALS — BP 122/63 | HR 71 | Resp 16 | Ht 70.0 in | Wt 156.0 lb

## 2018-11-18 DIAGNOSIS — I25118 Atherosclerotic heart disease of native coronary artery with other forms of angina pectoris: Secondary | ICD-10-CM

## 2018-11-18 DIAGNOSIS — I2510 Atherosclerotic heart disease of native coronary artery without angina pectoris: Secondary | ICD-10-CM | POA: Diagnosis not present

## 2018-11-18 DIAGNOSIS — I350 Nonrheumatic aortic (valve) stenosis: Secondary | ICD-10-CM | POA: Diagnosis not present

## 2018-11-18 MED ORDER — TRANEXAMIC ACID (OHS) PUMP PRIME SOLUTION
2.0000 mg/kg | INTRAVENOUS | Status: DC
  Filled 2018-11-18: qty 1.46

## 2018-11-18 MED ORDER — KENNESTONE BLOOD CARDIOPLEGIA (KBC) MANNITOL SYRINGE (20%, 32ML)
32.0000 mL | Freq: Once | INTRAVENOUS | Status: DC
  Filled 2018-11-18 (×2): qty 32

## 2018-11-18 MED ORDER — KENNESTONE BLOOD CARDIOPLEGIA VIAL: 13.0000 mL | Freq: Once | Status: DC

## 2018-11-18 MED ORDER — VANCOMYCIN HCL 10 G IV SOLR
1250.0000 mg | INTRAVENOUS | Status: AC
  Administered 2018-11-19: 1250 mg via INTRAVENOUS
  Filled 2018-11-18: qty 1250

## 2018-11-18 MED ORDER — MILRINONE LACTATE IN DEXTROSE 20-5 MG/100ML-% IV SOLN
0.3000 ug/kg/min | INTRAVENOUS | Status: DC
  Filled 2018-11-18: qty 100

## 2018-11-18 MED ORDER — VANCOMYCIN HCL 1000 MG IV SOLR
INTRAVENOUS | Status: AC
  Administered 2018-11-19: 1000 mL
  Filled 2018-11-18: qty 1000

## 2018-11-18 MED ORDER — INSULIN REGULAR(HUMAN) IN NACL 100-0.9 UT/100ML-% IV SOLN
INTRAVENOUS | Status: DC
  Filled 2018-11-18: qty 100

## 2018-11-18 MED ORDER — POTASSIUM CHLORIDE 2 MEQ/ML IV SOLN
80.0000 meq | INTRAVENOUS | Status: DC
  Filled 2018-11-18: qty 40

## 2018-11-18 MED ORDER — LEVOFLOXACIN IN D5W 500 MG/100ML IV SOLN
500.0000 mg | INTRAVENOUS | Status: AC
  Administered 2018-11-19: 500 mg via INTRAVENOUS
  Filled 2018-11-18: qty 100

## 2018-11-18 MED ORDER — MAGNESIUM SULFATE 50 % IJ SOLN
40.0000 meq | INTRAMUSCULAR | Status: DC
  Filled 2018-11-18: qty 9.85

## 2018-11-18 MED ORDER — PHENYLEPHRINE HCL-NACL 20-0.9 MG/250ML-% IV SOLN
30.0000 ug/min | INTRAVENOUS | Status: DC
  Filled 2018-11-18: qty 250

## 2018-11-18 MED ORDER — SODIUM CHLORIDE 0.9 % IV SOLN
INTRAVENOUS | Status: DC
  Filled 2018-11-18: qty 30

## 2018-11-18 MED ORDER — NITROGLYCERIN IN D5W 200-5 MCG/ML-% IV SOLN
2.0000 ug/min | INTRAVENOUS | Status: DC
  Filled 2018-11-18: qty 250

## 2018-11-18 MED ORDER — TRANEXAMIC ACID 1000 MG/10ML IV SOLN
1.5000 mg/kg/h | INTRAVENOUS | Status: AC
  Administered 2018-11-19: 1.5 mg/kg/h via INTRAVENOUS
  Filled 2018-11-18: qty 25

## 2018-11-18 MED ORDER — KENNESTONE BLOOD CARDIOPLEGIA VIAL
13.0000 mL | Freq: Once | Status: DC
  Filled 2018-11-18 (×3): qty 13

## 2018-11-18 MED ORDER — PLASMA-LYTE 148 IV SOLN
INTRAVENOUS | Status: AC
  Administered 2018-11-19: 500 mL
  Filled 2018-11-18 (×2): qty 2.5

## 2018-11-18 MED ORDER — TRANEXAMIC ACID (OHS) BOLUS VIA INFUSION
15.0000 mg/kg | INTRAVENOUS | Status: AC
  Administered 2018-11-19: 1093.5 mg via INTRAVENOUS
  Filled 2018-11-18: qty 1094

## 2018-11-18 MED ORDER — DEXMEDETOMIDINE HCL IN NACL 400 MCG/100ML IV SOLN
0.1000 ug/kg/h | INTRAVENOUS | Status: DC
  Filled 2018-11-18: qty 100

## 2018-11-18 MED ORDER — EPINEPHRINE PF 1 MG/ML IJ SOLN
0.0000 ug/min | INTRAVENOUS | Status: DC
  Filled 2018-11-18: qty 4

## 2018-11-18 MED ORDER — DOPAMINE-DEXTROSE 3.2-5 MG/ML-% IV SOLN
0.0000 ug/kg/min | INTRAVENOUS | Status: DC
  Filled 2018-11-18: qty 250

## 2018-11-18 NOTE — Patient Instructions (Signed)
  Have nothing to eat or drink after midnight.  On the morning of surgery take only Protonix with a sip of water.

## 2018-11-18 NOTE — H&P (Signed)
[address]       [name] [address]             [phone]          CARDIOTHORACIC SURGERY HISTORY AND PHYSICAL EXAM  Referring Provider is Christen Bame, MD PCP is Farris Has, MD      Chief Complaint  Patient presents with  . 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  Patient states he has known of presence of a heart murmur for several years.  He gets most of his medical care at the [facility] in [place].  He has history of chronic anemia with multiple previous visits to the emergency room for generalized weakness associated with symptomatic anemia.  He has never had any clinical GI bleeding episodes, but intermittently in the past occult testing of the stool has demonstrated the presence of Hemoccult blood.  He reportedly has undergone upper GI endoscopy and colonoscopy in the distant past, although the patient states that he has not had endoscopy for several years because he is "allergic" to the oral cathartics utilized for colonoscopy.  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  The patient reports that over the last several months he has had rapid progression of symptoms of exertional shortness of breath and generalized pain across his chest with profound decreased energy and generalized weakness.  He states that he has gotten to the point where he really cannot do any sort of sustained physical activity without getting  short of breath or tired.  He states that symptoms of shortness of breath and chest discomfort always relieved within a few minutes of rest.  He has not had any rest pain or shortness of breath.  He denies any history of PND, orthopnea, dizzy spells, or syncope.  However, he states that when his pain and shortness of breath gets severe he feels as though he is going to pass out.  He has developed some mild lower extremity edema.  The patient was seen in follow-up recently by his primary care physician at the [facility] and underwent an echocardiogram that revealed severe aortic stenosis.  He was referred to [facility] where he underwent echocardiogram and diagnostic cardiac catheterization on October 30, 2018.  By report transthoracic echocardiogram demonstrated normal left ventricular size with mild left ventricular systolic dysfunction.  There was reportedly hypokinesis or akinesis in the distal anterior wall and apex.  There was severe aortic stenosis reported with peak velocity across the aortic valve measured 4.3 m/s corresponding to mean transvalvular gradient estimated 46 mmHg.  The aortic valve area was calculated 0.64 cm.  There was normal right ventricular size and function, normal right atrial size, normal left atrial size, and no other significant valvular pathology.  Diagnostic cardiac catheterization was performed and reported to demonstrate multivessel coronary artery disease with high-grade 90% stenosis of the mid left anterior descending coronary artery.  There is 50% stenosis in the mid right coronary artery  and 30 to 50% stenosis in the left circumflex coronary artery.  Pulmonary artery pressures were mildly elevated.  Further work-up for possible surgical aortic valve replacement with coronary artery bypass grafting versus transcatheter aortic valve replacement and PCI was recommended.  The patient was unhappy with the care he received in  [place] and elected to defer work-up at that time.  He was self-referred to our office today for a second opinion.  The patient is single and lives locally in [place].  He has been widowed for 9 years and retired for longer than that.  He remains active, enjoying wood carving.  He drives a mobile and does all ordinary chores around his home.  He states that he has not been limited physically until recently.  He does not exercise on a regular basis, but he has not had any significant restrictions until the last several months.  He has a long history of tobacco abuse although he quit smoking in September as his problems with shortness of breath increased.  He has 1 adult daughter who lives in [place] and is scheduled to travel home to [place] for a visit next week.  Patient returns to the office today for follow-up with tentative plans to proceed with aortic valve replacement and coronary artery bypass grafting tomorrow.  He was originally seen in consultation last week on November 12, 2018.  Since then he underwent CT angiography, echocardiogram, and routine preoperative blood work with anemia panel.  He reports no new problems or complaints.  He is looking forward to proceeding with surgery as planned.  Past Medical History:  Diagnosis Date  . Anemia   . Arthritis   . Atherosclerosis of aorta (HCC) 03/26/2016  . Chronic pain   . COPD (chronic obstructive pulmonary disease) (HCC)   . Dyspnea    NOW HE HAS SOB DURING EXCERCISE  . GERD (gastroesophageal reflux disease)   . Heart murmur   . Heart murmur 03/26/2016  . Hyperlipidemia   . Hyperlipidemia with target LDL less than 70 03/26/2016  . Hypertension   . PTSD (post-traumatic stress disorder)   . SOB (shortness of breath)   . Tobacco abuse 03/26/2016    Past Surgical History:  Procedure Laterality Date  .  tonsilectomy as a child    . ADENOIDECTOMY    . CARDIAC CATHETERIZATION  10/29/2018   done at [facility]  . HERNIA  REPAIR    . SHOULDER ARTHROSCOPY    . TONSILLECTOMY      Family History  Problem Relation Age of Onset  . Congestive Heart Failure Father   . Hyperlipidemia Father   . Hypertension Father   . Heart disease Father     Social History Social History   Tobacco Use  . Smoking status: Former Smoker    Packs/day: 0.50    Years: 30.00    Pack years: 15.00    Types: Cigarettes    Start date: 12/31/1963    Last attempt to quit: 10/16/2018    Years since quitting: 0.0  . Smokeless tobacco: Never Used  . Tobacco comment: 2 cigarettes per day  Substance Use Topics  . Alcohol use: No    Alcohol/week: 0.0 standard drinks  . Drug use: No    Prior to Admission medications   Medication Sig Start Date End Date Taking? Authorizing Provider  albuterol (PROVENTIL HFA;VENTOLIN HFA) 108 (90 Base) MCG/ACT inhaler Inhale 2 puffs into the lungs every 4 (four) hours as needed for wheezing or shortness of  breath.   Yes [provider]  aspirin EC 81 MG tablet Take 81 mg by mouth daily.  10/30/18  Yes [provider]  atorvastatin (LIPITOR) 20 MG tablet Take 20 mg by mouth at bedtime.    Yes [provider]  ferrous sulfate 325 (65 FE) MG tablet Take 325 mg by mouth See admin instructions. Twice daily with meals on Monday, Wednesday, Friday 10/30/18  Yes [provider]  Multiple Vitamin (MULTIVITAMIN) tablet Take 1 tablet by mouth daily.   Yes [provider]  oxyCODONE (ROXICODONE) 15 MG immediate release tablet Take 15 mg by mouth every 8 (eight) hours as needed for pain.   Yes [provider]  pantoprazole (PROTONIX) 40 MG tablet Take 40 mg by mouth daily as needed (heartburn).  10/31/18  Yes [provider]  tamsulosin (FLOMAX) 0.4 MG CAPS capsule Take 0.4 mg by mouth daily.    Yes [provider]  vitamin B-12 (CYANOCOBALAMIN) 1000 MCG tablet Take 1,000 mcg by mouth daily.  10/31/18  Yes [provider]  vitamin C  (ASCORBIC ACID) 500 MG tablet Take 500 mg by mouth daily.   Yes [provider]    Allergies  Allergen Reactions  . Penicillins Anaphylaxis    Has patient had a PCN reaction causing immediate rash, facial/tongue/throat swelling, SOB or lightheadedness with hypotension: Yes Has patient had a PCN reaction causing severe rash involving mucus membranes or skin necrosis: Yes Has patient had a PCN reaction that required hospitalization: Yes Has patient had a PCN reaction occurring within the last 10 years: No If all of the above answers are "NO", then may proceed with Cephalosporin use.   . Other Other (See Comments)    Medication used prior to colonoscopy- Pt states it feels like "gasoline was poured on him", throat swelling  . Golytely [Peg 3350-Electrolytes] Rash     Review of Systems:              General:                      normal appetite, decreased energy, no weight gain, no weight loss, no fever             Cardiac:                       + chest pain with exertion, no chest pain at rest, + SOB with exertion, no resting SOB, no PND, no orthopnea, no palpitations, no arrhythmia, no atrial fibrillation, no LE edema, no dizzy spells, no syncope             Respiratory:                 + shortness of breath, no home oxygen, no productive cough, no dry cough, no bronchitis, no wheezing, no hemoptysis, no asthma, no pain with inspiration or cough, no sleep apnea, no CPAP at night             GI:                               no difficulty swallowing, no reflux, no frequent heartburn, no hiatal hernia, no abdominal pain, no constipation, no diarrhea, no hematochezia, no hematemesis, no melena             GU:  no dysuria,  no frequency, no urinary tract infection, no hematuria, + enlarged prostate, no kidney stones, no kidney disease             Vascular:                     no pain suggestive of claudication, no pain in feet, no leg cramps, no varicose  veins, no DVT, no non-healing foot ulcer             Neuro:                         no stroke, no TIA's, no seizures, no headaches, no temporary blindness one eye,  no slurred speech, no peripheral neuropathy, no chronic pain, no instability of gait, no memory/cognitive dysfunction             Musculoskeletal:         + arthritis, no joint swelling, no myalgias, no difficulty walking, normal mobility              Skin:                            no rash, no itching, no skin infections, no pressure sores or ulcerations             Psych:                         no anxiety, no depression, no nervousness, no unusual recent stress             Eyes:                           no blurry vision, no floaters, no recent vision changes, does not wears glasses or contacts             ENT:                            no hearing loss, no loose or painful teeth, edentulous w/out dentures, last saw dentist many years ago             Hematologic:               + easy bruising, no abnormal bleeding, no clotting disorder, no frequent epistaxis             Endocrine:                   no diabetes, does not check CBG's at home                                                       Physical Exam:              BP 127/67   Pulse 78   Resp 20   Ht 5\' 10"  (1.778 m)   Wt 156 lb (70.8 kg)   SpO2 98% Comment: RA  BMI 22.38 kg/m              General:  Thin,  well-appearing             HEENT:                       Unremarkable              Neck:                           no JVD, no bruits, no adenopathy              Chest:                          clear to auscultation, symmetrical breath sounds, no wheezes, no rhonchi              CV:                              RRR, grade IV/VI crescendo/decrescendo murmur heard best at RSB,  no diastolic murmur             Abdomen:                    soft, non-tender, no masses              Extremities:                 warm, well-perfused, pulses diminished,  no LE edema             Rectal/GU                   Deferred             Neuro:                         Grossly non-focal and symmetrical throughout             Skin:                            Clean and dry, no rashes, no breakdown   Diagnostic Tests:  TRANSTHORACIC ECHOCARDIOGRAM  Report from transthoracic echocardiogram performed October 30, 2018 at [facility] is reviewed.  Images from this examination are not currently available for review.  By report the patient had mild left ventricular systolic function with akinesis of the LAD territory involving the mid and apical segments.  Ejection fraction was estimated 45 to 50%.  There was severe aortic stenosis.  Peak velocity across the aortic valve was reported 4.3 m/s corresponding to mean transvalvular gradient estimated 46 mmHg and valve area calculated 0.64 cm.   CARDIAC CATHETERIZATION  Both report and images from diagnostic cardiac catheterization performed October 29, 2018 [facility] are reviewed.  The patient has diffuse coronary artery disease.  There is high-grade 70 to 90% stenosis of the mid left anterior descending coronary artery.  There is 30 to 50% stenosis of left circumflex coronary artery with 50 to 70% stenosis of the ostial portion of the obtuse marginal branch.  There is 50% stenosis of the mid right coronary artery.  There is heavy calcification noted in the aortic valve and aortic annulus.  Right heart pressures were mildly elevated.  Transvalvular gradient was not assessed.   Transthoracic Echocardiography  Patient:  Roi, Jafari MR #: [id number] Study Date: 11/16/2018 Gender: M Age: 83 Height: 180.3 cm Weight: 72.9 kg BSA: 1.91 m^2 Pt. Status: Room:  ATTENDING Tressie Stalker, M.D. ORDERING Tressie Stalker, M.D. REFERRING Tressie Stalker, M.D. SONOGRAPHER Lauren Pennington, RDCS, CCT PERFORMING  [facility], Outpatient  cc:  ------------------------------------------------------------------- LV EF: 50% - 55%  ------------------------------------------------------------------- History: PMH: Pre-operation testing. Aortic stenosis.  ------------------------------------------------------------------- Study Conclusions  - Left ventricle: The cavity size was normal. Systolic function was normal. The estimated ejection fraction was in the range of 50% to 55%. Wall motion was normal; there were no regional wall motion abnormalities. Features are consistent with a pseudonormal left ventricular filling pattern, with concomitant abnormal relaxation and increased filling pressure (grade 2 diastolic dysfunction). Doppler parameters are consistent with elevated ventricular end-diastolic filling pressure. - Aortic valve: Trileaflet; severely thickened, severely calcified leaflets. Cusp separation was reduced. There was mild regurgitation. Mean gradient (S): 52 mm Hg. Peak gradient (S): 79 mm Hg. Valve area (VTI): 0.63 cm^2. Valve area (Vmax): 0.62 cm^2. Valve area (Vmean): 0.57 cm^2. - Mitral valve: There was moderate regurgitation. - Right ventricle: Systolic function was normal. - Right atrium: The atrium was normal in size. - Tricuspid valve: There was mild regurgitation. - Pulmonary arteries: Systolic pressure was within the normal range. - Pericardium, extracardiac: There was no pericardial effusion.  Impressions:  - There is hypokinesis of the basal anteroseptal, mid anteroseptal, anterior and apical septal walls and of the true apex, overall LVEF 45-50% as the residual walls are hyperdynamic. No apical thrombus. Aortic stenosis is severe with mild regurgitation. Mitral regurgitation is at least moderate, if surgery is planned for AVR, a TEE should be considered for further evaluation of mitral  regurgitation.  ------------------------------------------------------------------- Study data: Study status: Routine. Procedure: Transthoracic echocardiography. Image quality was adequate. Study completion: There were no complications. Transthoracic echocardiography. M-mode, complete 2D, spectral Doppler, and color Doppler. Birthdate: Patient birthdate: [birth year]. Age: Patient is 72 yr old. Sex: Gender: male. BMI: 22.4 kg/m^2. Blood pressure: 145/72 Patient status: Outpatient. Study date: Study date: 11/16/2018. Study time: 09:55 AM. Location: Echo laboratory.  -------------------------------------------------------------------  ------------------------------------------------------------------- Left ventricle: The cavity size was normal. Systolic function was normal. The estimated ejection fraction was in the range of 50% to 55%. Wall motion was normal; there were no regional wall motion abnormalities. Features are consistent with a pseudonormal left ventricular filling pattern, with concomitant abnormal relaxation and increased filling pressure (grade 2 diastolic dysfunction). Doppler parameters are consistent with elevated ventricular end-diastolic filling pressure.  ------------------------------------------------------------------- Aortic valve: Trileaflet; severely thickened, severely calcified leaflets. Cusp separation was reduced. Mobility was not restricted. Doppler: Transvalvular velocity was within the normal range. There was no stenosis. There was mild regurgitation. VTI ratio of LVOT to aortic valve: 0.22. Valve area (VTI): 0.63 cm^2. Indexed valve area (VTI): 0.33 cm^2/m^2. Peak velocity ratio of LVOT to aortic valve: 0.22. Valve area (Vmax): 0.62 cm^2. Indexed valve area (Vmax): 0.32 cm^2/m^2. Mean velocity ratio of LVOT to aortic valve: 0.2. Valve area (Vmean): 0.57 cm^2. Indexed valve area (Vmean): 0.3 cm^2/m^2.  Mean gradient (S): 52 mm Hg. Peak gradient (S): 79 mm Hg.  ------------------------------------------------------------------- Aorta: Aortic root: The aortic root was normal in size.  ------------------------------------------------------------------- Mitral valve: Structurally normal valve. Mobility was not restricted. Doppler: Transvalvular velocity was within the normal range. There was no evidence for stenosis. There was moderate regurgitation. Peak gradient (D): 8 mm Hg.  ------------------------------------------------------------------- Left atrium: The atrium was normal in size.  ------------------------------------------------------------------- Right ventricle: The cavity size was normal.  Wall thickness was normal. Systolic function was normal.  ------------------------------------------------------------------- Pulmonic valve: Structurally normal valve. Cusp separation was normal. Doppler: Transvalvular velocity was within the normal range. There was no evidence for stenosis. There was no regurgitation.  ------------------------------------------------------------------- Tricuspid valve: Structurally normal valve. Doppler: Transvalvular velocity was within the normal range. There was mild regurgitation.  ------------------------------------------------------------------- Pulmonary artery: The main pulmonary artery was normal-sized. Systolic pressure was within the normal range.  ------------------------------------------------------------------- Right atrium: The atrium was normal in size.  ------------------------------------------------------------------- Pericardium: There was no pericardial effusion.  ------------------------------------------------------------------- Systemic veins: Inferior vena cava: The vessel was normal in  size.  ------------------------------------------------------------------- Measurements  Left ventricle Value Reference LV ID, ED, PLAX chordal 49 mm 43 - 52 LV ID, ES, PLAX chordal 34 mm 23 - 38 LV fx shortening, PLAX chordal 31 % >=29 LV PW thickness, ED 9 mm ---------- IVS/LV PW ratio, ED 1 <=1.3 Stroke volume, 2D 67 ml ---------- Stroke volume/bsa, 2D 35 ml/m^2 ---------- LV e&', lateral 10.1 cm/s ---------- LV E/e&', lateral 13.66 ---------- LV e&', medial 8.59 cm/s ---------- LV E/e&', medial 16.07 ---------- LV e&', average 9.35 cm/s ---------- LV E/e&', average 14.77 ----------  Ventricular septum Value Reference IVS thickness, ED 9 mm ----------  LVOT Value Reference LVOT ID, S 19 mm ---------- LVOT area 2.84 cm^2 ---------- LVOT peak velocity, S 90.5 cm/s ---------- LVOT mean velocity, S 64.1 cm/s ---------- LVOT VTI, S 23.5 cm ----------  Aortic valve Value Reference Aortic valve peak velocity, S 416 cm/s ---------- Aortic valve mean velocity, S 317 cm/s ---------- Aortic valve VTI, S 106 cm ---------- Aortic mean gradient, S 45 mm Hg  ---------- Aortic peak gradient, S 69 mm Hg ---------- VTI ratio, LVOT/AV 0.22 ---------- Aortic valve area, VTI 0.63 cm^2 ---------- Aortic valve area/bsa, VTI 0.33 cm^2/m^2 ---------- Velocity ratio, peak, LVOT/AV 0.22 ---------- Aortic valve area, peak velocity 0.62 cm^2 ---------- Aortic valve area/bsa, peak 0.32 cm^2/m^2 ---------- velocity Velocity ratio, mean, LVOT/AV 0.2 ---------- Aortic valve area, mean velocity 0.57 cm^2 ---------- Aortic valve area/bsa, mean 0.3 cm^2/m^2 ---------- velocity  Aorta Value Reference Aortic root ID, ED 27 mm ----------  Left atrium Value Reference LA ID, A-P, ES 39 mm ---------- LA ID/bsa, A-P 2.04 cm/m^2 <=2.2 LA volume, S 51.6 ml ---------- LA volume/bsa, S 27 ml/m^2 ---------- LA volume, ES, 1-p A4C 42.4 ml ---------- LA volume/bsa, ES, 1-p A4C 22.2 ml/m^2 ---------- LA volume, ES, 1-p A2C 60 ml ---------- LA volume/bsa, ES, 1-p A2C 31.4 ml/m^2 ----------  Mitral valve Value Reference Mitral E-wave peak velocity 138 cm/s ---------- Mitral A-wave peak velocity 71 cm/s ---------- Mitral deceleration time (H) 275 ms 150 - 230 Mitral peak gradient, D 8 mm Hg ---------- Mitral E/A ratio, peak 1.9 ----------  Right atrium  Value Reference RA ID, S-I, ES, A4C 47.2 mm 34 - 49 RA area, ES, A4C 14.7 cm^2 8.3 - 19.5 RA volume, ES, A/L 37.4 ml ---------- RA volume/bsa, ES, A/L 19.6 ml/m^2 ----------  Systemic veins Value Reference Estimated CVP 3 mm Hg ----------  Right ventricle Value Reference TAPSE 28.1 mm ---------- RV s&', lateral, S 20.1 cm/s ----------  Legend: (L) and (H) mark values outside specified reference range.  ------------------------------------------------------------------- Prepared and Electronically Authenticated by  Tobias Alexander, M.D. 2019-11-18T15:50:59       Cardiac TAVR CT  TECHNIQUE: The patient was scanned on a [employer]. A 120 kV retrospective scan was triggered in the descending thoracic aorta at 111 HU's. Gantry rotation speed was 250 msecs and collimation  was .6 mm. No beta blockade or nitro were given. The 3D data set was reconstructed in 5% intervals of the R-R cycle. Systolic and diastolic phases were analyzed on a dedicated work station using MPR, MIP and VRT modes. The patient received 80 cc of contrast.  FINDINGS: Aortic Valve: Trileaflet aortic valve with severely thickened and calcified leaflets and severely restricted leaflets opening. No calcifications are extending into the LVOT.  Aorta: Normal size, no dissection, mild diffuse calcifications and atheroma in the ascending aorta and aortic arch, severe atherosclerotic plaque in the descending aorta.  Sinotubular Junction: 25 x 23 mm  Ascending Thoracic Aorta: 31 x 31 mm  Aortic Arch: 26 x 25 mm  Descending Thoracic Aorta: 24 x 24 mm  Sinus  of Valsalva Measurements:  Non-coronary: 27 mm  Right -coronary: 27 mm  Left -coronary: 29 mm  Coronary Artery Height above Annulus:  Left Main: 14 mm  Right Coronary: 15 mm  Virtual Basal Annulus Measurements:  Maximum/Minimum Diameter: 26.9 x 19.8 mm  Mean Diameter: 23.5 mm  Perimeter: 75.9 mm  Area: 433 mm2  IMPRESSION: 1. Trileaflet aortic valve with severely thickened and calcified leaflets and severely restricted leaflets opening. No calcifications are extending into the LVOT.  2. Thoracic aorta has normal size, no dissection, mild diffuse calcifications and atheroma in the ascending aorta and aortic arch and severe atherosclerotic plaque in the descending aorta.  3. No thrombus in the left atrial appendage.   Electronically Signed By: Tobias Alexander On: 11/15/2018 16:38   CT ANGIOGRAPHY CHEST, ABDOMEN AND PELVIS  TECHNIQUE: Multidetector CT imaging through the chest, abdomen and pelvis was performed using the standard protocol during bolus administration of intravenous contrast. Multiplanar reconstructed images and MIPs were obtained and reviewed to evaluate the vascular anatomy.  CONTRAST: ISOVUE-370 IOPAMIDOL (ISOVUE-370) INJECTION 76%  COMPARISON: None.  FINDINGS: CTA CHEST FINDINGS  Cardiovascular: Heart size is normal. There is no significant pericardial fluid, thickening or pericardial calcification. There is aortic atherosclerosis, as well as atherosclerosis of the great vessels of the mediastinum and the coronary arteries, including calcified atherosclerotic plaque in the left main, left anterior descending, left circumflex and right coronary arteries. Severe thickening calcification of the aortic valve.  Mediastinum/Lymph Nodes: No pathologically enlarged mediastinal or hilar lymph nodes. Esophagus is unremarkable in appearance. No axillary lymphadenopathy.  Lungs/Pleura: Small bilateral pleural  effusions lying dependently. No acute consolidative airspace disease. No suspicious appearing pulmonary nodules or masses.  Musculoskeletal/Soft Tissues: There are no aggressive appearing lytic or blastic lesions noted in the visualized portions of the skeleton.  CTA ABDOMEN AND PELVIS FINDINGS  Hepatobiliary: Calcified granuloma in the periphery of segment 8 of the liver. No suspicious cystic or solid hepatic lesions. No intra or extrahepatic biliary ductal dilatation. Gallbladder is normal in appearance.  Pancreas: No pancreatic mass. No pancreatic ductal dilatation. No pancreatic or peripancreatic fluid or inflammatory changes.  Spleen: Unremarkable.  Adrenals/Urinary Tract: Multiple nonobstructive calculi are noted within the collecting systems of both kidneys, measuring up to 8 mm in the lower pole collecting system of left kidney. No calculi are identified along the course of either ureter or within the lumen of the urinary bladder. No suspicious renal lesions on postcontrast images. No hydroureteronephrosis. Urinary bladder is normal in appearance. Bilateral adrenal glands are normal in appearance.  Stomach/Bowel: Normal appearance of the stomach. No pathologic dilatation of small bowel or colon. Normal appendix.  Vascular/Lymphatic: Aortic atherosclerosis, without evidence of aneurysm or dissection in the abdominal or pelvic vasculature.  Vascular findings and measurements pertinent to potential TAVR procedure, as detailed below. No lymphadenopathy noted in the abdomen or pelvis.  Reproductive: Prostate gland and seminal vesicles are unremarkable in appearance.  Other: Trace volume of ascites. No pneumoperitoneum.  Musculoskeletal: There are no aggressive appearing lytic or blastic lesions noted in the visualized portions of the skeleton.  VASCULAR MEASUREMENTS PERTINENT TO TAVR:  AORTA:  Minimal Aortic Diameter-12 x 16 mm  Severity of Aortic  Calcification-severe  RIGHT PELVIS:  Right Common Iliac Artery -  Minimal Diameter-7.9 x 7.2 mm  Tortuosity-mild  Calcification-moderate  Right External Iliac Artery -  Minimal Diameter-5.6 x 6.4 mm  Tortuosity - mild  Calcification-mild  Right Common Femoral Artery -  Minimal Diameter-6.3 x 3.1 mm  Tortuosity - mild  Calcification-severe  LEFT PELVIS:  Left Common Iliac Artery -  Minimal Diameter-6.5 x 5.5 mm  Tortuosity - mild  Calcification - moderate  Left External Iliac Artery -  Minimal Diameter-5.0 x 4.6 mm  Tortuosity - mild  Calcification - moderate  Left Common Femoral Artery -  Minimal Diameter-5.3 x 3.4 mm  Tortuosity - mild  Calcification - moderate  Review of the MIP images confirms the above findings.  IMPRESSION: 1. Vascular findings and measurements pertinent to potential TAVR procedure, as detailed above. 2. Severe thickening calcification of the aortic valve, compatible with the reported clinical history of severe aortic stenosis. 3. Aortic atherosclerosis, in addition to left main and 3 vessel coronary artery disease. Please note that although the presence of coronary artery calcium documents the presence of coronary artery disease, the severity of this disease and any potential stenosis cannot be assessed on this non-gated CT examination. Assessment for potential risk factor modification, dietary therapy or pharmacologic therapy may be warranted, if clinically indicated. 4. Small bilateral pleural effusions lying dependently. 5. Multiple nonobstructive calculi are noted within the collecting systems of both kidneys measuring up to 8 mm in the lower pole the left kidney. No ureteral stones or findings of urinary tract obstruction are noted at this time. 6. Additional incidental findings, as above.   Electronically Signed By: Trudie Reed M.D. 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  I have personally reviewed the patient's transthoracic echocardiogram, diagnostic cardiac catheterization, and CT angiograms.  Echocardiogram confirmed the presence of severe aortic stenosis with mild left ventricular systolic dysfunction.  Ejection fraction was estimated 50 to 55%.  The aortic valve was trileaflet.  There was severely thickened and calcified leaflets with extremely diminished leaflet mobility.  Peak velocity across the aortic valve measured 4.2 m/s corresponding to mean transvalvular gradient estimated greater than 45 mmHg.  There was mild to moderate aortic insufficiency and mild to moderate mitral regurgitation.  Diagnostic cardiac catheterization revealed severe multivessel coronary artery disease including high-grade 70 to 90% stenosis of the mid left anterior descending coronary artery.   CT angiography reveals normal-sized aorta without any significant aneurysmal disease or calcification involving the aorta or aortic root.  Options include conventional surgical aortic valve replacement and coronary artery bypass grafting versus PCI and stenting of the left anterior descending coronary artery with transcatheter aortic valve replacement. The remainder of the patient's coronary artery disease could be managed medically. Risks associated with conventional surgery should be relatively low. 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 Long-term treatment with dual antiplatelet therapy could be problematic. I favor proceeding with conventional surgical aortic valve  replacement and coronary artery bypass grafting in the near future, although the patient might be at risk for further bleeding complications.  He will need both upper GI endoscopy and colonoscopy performed at some point in the near future once he has recovered from his surgery.    Plan:  The patientand his daughter and sister-in-law werecounseled at length regarding treatment alternatives for management of severe symptomatic aortic stenosis and multivessel coronary artery disease. Alternative approaches such as conventional aortic valve replacementwith CABG, transcatheter aortic valve replacementwith PCI, and continued medical therapy without intervention were compared and contrasted at length. The risks associated with conventional surgerywere discussed in detail, as were expectations for post-operative convalescence. Issues specific to transcatheter aortic valve replacement and PCIwere discussed includingthe need for dual antiplatelet therapy,questions about long term valve durability, the potential for paravalvular leak, possible increased risk of need for permanent pacemaker placement, and other technical complications related to the procedures. Long-term prognosis with medical therapy was discussed. The need for formal GI evaluation to include upper GI endoscopy and colonoscopy was discussed. This discussion was placed in the context of the patient's own specific clinical presentation and past medical history.   Discussion was held comparing the relative risks of mechanical valve replacement with need for lifelong anticoagulation versus use of a bioprosthetic tissue valve and the associated potential for late structural valve deterioration and failure.  This discussion was placed in the context of the patient's particular circumstances, and as a result the patient specifically requests that their valve be replaced using a bioprosthetic tissue valve .  The patient understands and  accepts all potential associated risks of surgery including but not limited to risk of death, stroke, myocardial infarction, congestive heart failure, respiratory failure, renal failure, pneumonia, bleeding requiring blood transfusion and or reexploration, arrhythmia, heart block or bradycardia requiring permanent pacemaker, aortic dissection or other major vascular complication, pleural effusions or other delayed complications related to continued congestive heart failure, late recurrence of ischemic heart disease, and other late complications related to valve replacement including structural valve deterioration and failure, thrombosis, endocarditis, or paravalvular leak.  All questions answered.    Jonathan Holland. Jonathan Moras, MD 11/18/2018 3:53 PM

## 2018-11-18 NOTE — Progress Notes (Signed)
[address]       [place] [address]             [phone]     CARDIOTHORACIC SURGERY OFFICE NOTE  Referring Provider is Christen Bame, MD PCP is Farris Has, MD   HPI:  Patient returns to the office today for follow-up with tentative plans to proceed with aortic valve replacement and coronary artery bypass grafting tomorrow.  He was originally seen in consultation last week on November 12, 2018.  Since then he underwent CT angiography, echocardiogram, and routine preoperative blood work with anemia panel.  He reports no new problems or complaints.  He is looking forward to proceeding with surgery as planned.   Current Outpatient Medications  Medication Sig Dispense Refill  . albuterol (PROVENTIL HFA;VENTOLIN HFA) 108 (90 Base) MCG/ACT inhaler Inhale 2 puffs into the lungs every 4 (four) hours as needed for wheezing or shortness of breath.    Marland Kitchen aspirin EC 81 MG tablet Take 81 mg by mouth daily.     Marland Kitchen atorvastatin (LIPITOR) 20 MG tablet Take 20 mg by mouth at bedtime.     . ferrous sulfate 325 (65 FE) MG tablet Take 325 mg by mouth See admin instructions. Twice daily with meals on Monday, Wednesday, Friday    . Multiple Vitamin (MULTIVITAMIN) tablet Take 1 tablet by mouth daily.    Marland Kitchen oxyCODONE (ROXICODONE) 15 MG immediate release tablet Take 15 mg by mouth every 8 (eight) hours as needed for pain.    . pantoprazole (PROTONIX) 40 MG tablet Take 40 mg by mouth daily as needed (heartburn).     . tamsulosin (FLOMAX) 0.4 MG CAPS capsule Take 0.4 mg by mouth daily.     . vitamin B-12 (CYANOCOBALAMIN) 1000 MCG tablet Take 1,000 mcg by mouth daily.     . vitamin C (ASCORBIC ACID) 500 MG tablet Take 500 mg by mouth daily.     No current facility-administered medications for this visit.    Facility-Administered Medications Ordered in Other Visits  Medication Dose Route Frequency Provider Last Rate Last Dose  . [START ON 11/19/2018] dexmedetomidine (PRECEDEX) 400  MCG/100ML (4 mcg/mL) infusion  0.1-0.7 mcg/kg/hr Intravenous To OR Purcell Nails, MD      . Melene Muller ON 11/19/2018] DOPamine (INTROPIN) 800 mg in dextrose 5 % 250 mL (3.2 mg/mL) infusion  0-10 mcg/kg/min Intravenous To OR Purcell Nails, MD      . Melene Muller ON 11/19/2018] EPINEPHrine (ADRENALIN) 4 mg in dextrose 5 % 250 mL (0.016 mg/mL) infusion  0-10 mcg/min Intravenous To OR Purcell Nails, MD      . Melene Muller ON 11/19/2018] heparin 2,500 Units, papaverine 30 mg in electrolyte-148 (PLASMALYTE-148) 500 mL irrigation   Irrigation To OR Purcell Nails, MD      . Melene Muller ON 11/19/2018] heparin 30,000 units/NS 1000 mL solution for CELLSAVER   Other To OR Purcell Nails, MD      . Melene Muller ON 11/19/2018] insulin regular, human (MYXREDLIN) 100 units/ 100 mL infusion   Intravenous To OR Purcell Nails, MD      . [facility]) lidocaine 2% Syringe (13mL)  13 mL Intracoronary Once Purcell Nails, MD      . Melene Muller ON 11/19/2018] [facility] ([facility]) mannitol 20% Syringe (32mL)  32 mL Intracoronary Once Purcell Nails, MD      . Melene Muller ON 11/19/2018] [facility] ([facility]) mannitol 20% Syringe (32mL)  32  mL Intracoronary Once Purcell Nailswen, Chabely Norby H, MD      . Melene Muller[START ON 11/19/2018] levofloxacin (LEVAQUIN) IVPB 500 mg  500 mg Intravenous To OR Purcell Nailswen, Blondell Laperle H, MD      . Melene Muller[START ON 11/19/2018] magnesium sulfate (IV Push/IM) injection 40 mEq  40 mEq Other To OR Purcell Nailswen, Aanshi Batchelder H, MD      . Melene Muller[START ON 11/19/2018] milrinone (PRIMACOR) 20 MG/100 ML (0.2 mg/mL) infusion  0.3 mcg/kg/min Intravenous To OR Purcell Nailswen, Makaiyah Schweiger H, MD      . Melene Muller[START ON 11/19/2018] nitroGLYCERIN 50 mg in dextrose 5 % 250 mL (0.2 mg/mL) infusion  2-200 mcg/min Intravenous To OR Purcell Nailswen, Klyde Banka H, MD      . Melene Muller[START ON 11/19/2018] phenylephrine (NEOSYNEPHRINE) 20-0.9 MG/250ML-% infusion  30-200 mcg/min Intravenous To OR Purcell Nailswen, Devaney Segers H, MD      . Melene Muller[START ON 11/19/2018] potassium chloride injection 80 mEq   80 mEq Other To OR Purcell Nailswen, Danta Baumgardner H, MD      . Melene Muller[START ON 11/19/2018] tranexamic acid (CYKLOKAPRON) 2,500 mg in sodium chloride 0.9 % 250 mL (10 mg/mL) infusion  1.5 mg/kg/hr Intravenous To OR Purcell Nailswen, Moksh Loomer H, MD      . Melene Muller[START ON 11/19/2018] tranexamic acid (CYKLOKAPRON) bolus via infusion - over 30 minutes 1,093.5 mg  15 mg/kg Intravenous To OR Purcell Nailswen, Donie Lemelin H, MD      . Melene Muller[START ON 11/19/2018] tranexamic acid (CYKLOKAPRON) pump prime solution 146 mg  2 mg/kg Intracatheter To OR Purcell Nailswen, Dayana Dalporto H, MD      . Melene Muller[START ON 11/19/2018] vancomycin (VANCOCIN) 1,000 mg in sodium chloride 0.9 % 1,000 mL irrigation   Irrigation To OR Purcell Nailswen, Davi Kroon H, MD      . Melene Muller[START ON 11/19/2018] vancomycin (VANCOCIN) 1,250 mg in sodium chloride 0.9 % 250 mL IVPB  1,250 mg Intravenous To OR Purcell Nailswen, Laylamarie Meuser H, MD          Physical Exam:   BP 122/63 (BP Location: Left Arm, Patient Position: Sitting, Cuff Size: Large)   Pulse 71   Resp 16   Ht 5\' 10"  (1.778 m)   Wt 156 lb (70.8 kg)   SpO2 98% Comment: ON RA  BMI 22.38 kg/m   General:  Well-appearing  Chest:   Clear to auscultation  CV:   Regular rate and rhythm with systolic murmur  Incisions:  n/a  Abdomen:  Soft nontender  Extremities:  Warm and well-perfused  Diagnostic Tests:  Transthoracic Echocardiography  Patient:    Jonathan [place], [place] Holland MR #:       [id number] Study Date: 11/16/2018 Gender:     M Age:        [age] Height:     180.3 cm Weight:     72.9 kg BSA:        1.91 m^2 Pt. Status: Room:   ATTENDING    Tressie Stalkerlarence Cornelio Parkerson, M.D.  ORDERING     Tressie Stalkerlarence Madisun Hargrove, M.D.  REFERRING    Tressie Stalkerlarence Abi Shoults, M.D.  SONOGRAPHER  Lauren Pennington, RDCS, CCT  PERFORMING   [facility], Outpatient  cc:  ------------------------------------------------------------------- LV EF: 50% -   55%  ------------------------------------------------------------------- History:   PMH:  Pre-operation testing. Aortic  stenosis.  ------------------------------------------------------------------- Study Conclusions  - Left ventricle: The cavity size was normal. Systolic function was   normal. The estimated ejection fraction was in the range of 50%   to 55%. Wall motion was normal; there were no regional wall   motion abnormalities. Features are consistent with a pseudonormal   left ventricular filling  pattern, with concomitant abnormal   relaxation and increased filling pressure (grade 2 diastolic   dysfunction). Doppler parameters are consistent with elevated   ventricular end-diastolic filling pressure. - Aortic valve: Trileaflet; severely thickened, severely calcified   leaflets. Cusp separation was reduced. There was mild   regurgitation. Mean gradient (S): 52 mm Hg. Peak gradient (S): 79   mm Hg. Valve area (VTI): 0.63 cm^2. Valve area (Vmax): 0.62 cm^2.   Valve area (Vmean): 0.57 cm^2. - Mitral valve: There was moderate regurgitation. - Right ventricle: Systolic function was normal. - Right atrium: The atrium was normal in size. - Tricuspid valve: There was mild regurgitation. - Pulmonary arteries: Systolic pressure was within the normal   range. - Pericardium, extracardiac: There was no pericardial effusion.  Impressions:  - There is hypokinesis of the basal anteroseptal, mid anteroseptal,   anterior and apical septal walls and of the true apex, overall   LVEF 45-50% as the residual walls are hyperdynamic. No apical   thrombus. Aortic stenosis is severe with mild regurgitation.   Mitral regurgitation is at least moderate, if surgery is planned   for AVR, a TEE should be considered for further evaluation of   mitral regurgitation.  ------------------------------------------------------------------- Study data:   Study status:  Routine.  Procedure:  Transthoracic echocardiography. Image quality was adequate.  Study completion: There were no complications.           Transthoracic echocardiography.  M-mode, complete 2D, spectral Doppler, and color Doppler.  Birthdate:  Patient birthdate: 1946/06/14.  Age:  Patient is 72 yr old.  Sex:  Gender: male.    BMI: 22.4 kg/m^2.  Blood pressure:     145/72  Patient status:  Outpatient.  Study date: Study date: 11/16/2018. Study time: 09:55 AM.  Location:  Echo laboratory.  -------------------------------------------------------------------  ------------------------------------------------------------------- Left ventricle:  The cavity size was normal. Systolic function was normal. The estimated ejection fraction was in the range of 50% to 55%. Wall motion was normal; there were no regional wall motion abnormalities. Features are consistent with a pseudonormal left ventricular filling pattern, with concomitant abnormal relaxation and increased filling pressure (grade 2 diastolic dysfunction). Doppler parameters are consistent with elevated ventricular end-diastolic filling pressure.  ------------------------------------------------------------------- Aortic valve:   Trileaflet; severely thickened, severely calcified leaflets. Cusp separation was reduced. Mobility was not restricted.  Doppler:  Transvalvular velocity was within the normal range. There was no stenosis. There was mild regurgitation.    VTI ratio of LVOT to aortic valve: 0.22. Valve area (VTI): 0.63 cm^2. Indexed valve area (VTI): 0.33 cm^2/m^2. Peak velocity ratio of LVOT to aortic valve: 0.22. Valve area (Vmax): 0.62 cm^2. Indexed valve area (Vmax): 0.32 cm^2/m^2. Mean velocity ratio of LVOT to aortic valve: 0.2. Valve area (Vmean): 0.57 cm^2. Indexed valve area (Vmean): 0.3 cm^2/m^2.    Mean gradient (S): 52 mm Hg. Peak gradient (S): 79 mm Hg.  ------------------------------------------------------------------- Aorta:  Aortic root: The aortic root was normal in  size.  ------------------------------------------------------------------- Mitral valve:   Structurally normal valve.   Mobility was not restricted.  Doppler:  Transvalvular velocity was within the normal range. There was no evidence for stenosis. There was moderate regurgitation.    Peak gradient (D): 8 mm Hg.  ------------------------------------------------------------------- Left atrium:  The atrium was normal in size.  ------------------------------------------------------------------- Right ventricle:  The cavity size was normal. Wall thickness was normal. Systolic function was normal.  ------------------------------------------------------------------- Pulmonic valve:    Structurally normal valve.   Cusp separation was normal.  Doppler:  Transvalvular velocity was within the normal range. There was no evidence for stenosis. There was no regurgitation.  ------------------------------------------------------------------- Tricuspid valve:   Structurally normal valve.    Doppler: Transvalvular velocity was within the normal range. There was mild regurgitation.  ------------------------------------------------------------------- Pulmonary artery:   The main pulmonary artery was normal-sized. Systolic pressure was within the normal range.  ------------------------------------------------------------------- Right atrium:  The atrium was normal in size.  ------------------------------------------------------------------- Pericardium:  There was no pericardial effusion.  ------------------------------------------------------------------- Systemic veins: Inferior vena cava: The vessel was normal in size.  ------------------------------------------------------------------- Measurements   Left ventricle                           Value          Reference  LV ID, ED, PLAX chordal                  49    mm       43 - 52  LV ID, ES, PLAX chordal                  34     mm       23 - 38  LV fx shortening, PLAX chordal           31    %        >=29  LV PW thickness, ED                      9     mm       ----------  IVS/LV PW ratio, ED                      1              <=1.3  Stroke volume, 2D                        67    ml       ----------  Stroke volume/bsa, 2D                    35    ml/m^2   ----------  LV e&', lateral                           10.1  cm/s     ----------  LV E/e&', lateral                         13.66          ----------  LV e&', medial                            8.59  cm/s     ----------  LV E/e&', medial                          16.07          ----------  LV e&', average                           9.35  cm/s     ----------  LV E/e&', average  14.77          ----------    Ventricular septum                       Value          Reference  IVS thickness, ED                        9     mm       ----------    LVOT                                     Value          Reference  LVOT ID, S                               19    mm       ----------  LVOT area                                2.84  cm^2     ----------  LVOT peak velocity, S                    90.5  cm/s     ----------  LVOT mean velocity, S                    64.1  cm/s     ----------  LVOT VTI, S                              23.5  cm       ----------    Aortic valve                             Value          Reference  Aortic valve peak velocity, S            416   cm/s     ----------  Aortic valve mean velocity, S            317   cm/s     ----------  Aortic valve VTI, S                      106   cm       ----------  Aortic mean gradient, S                  45    mm Hg    ----------  Aortic peak gradient, S                  69    mm Hg    ----------  VTI ratio, LVOT/AV                       0.22           ----------  Aortic valve area, VTI                   0.63  cm^2     ----------  Aortic valve area/bsa, VTI               0.33  cm^2/m^2  ----------  Velocity ratio, peak, LVOT/AV            0.22           ----------  Aortic valve area, peak velocity         0.62  cm^2     ----------  Aortic valve area/bsa, peak              0.32  cm^2/m^2 ----------  velocity  Velocity ratio, mean, LVOT/AV            0.2            ----------  Aortic valve area, mean velocity         0.57  cm^2     ----------  Aortic valve area/bsa, mean              0.3   cm^2/m^2 ----------  velocity    Aorta                                    Value          Reference  Aortic root ID, ED                       27    mm       ----------    Left atrium                              Value          Reference  LA ID, A-P, ES                           39    mm       ----------  LA ID/bsa, A-P                           2.04  cm/m^2   <=2.2  LA volume, S                             51.6  ml       ----------  LA volume/bsa, S                         27    ml/m^2   ----------  LA volume, ES, 1-p A4C                   42.4  ml       ----------  LA volume/bsa, ES, 1-p A4C               22.2  ml/m^2   ----------  LA volume, ES, 1-p A2C                   60    ml       ----------  LA volume/bsa, ES, 1-p A2C               31.4  ml/m^2   ----------    Mitral valve  Value          Reference  Mitral E-wave peak velocity              138   cm/s     ----------  Mitral A-wave peak velocity              71    cm/s     ----------  Mitral deceleration time         (H)     275   ms       150 - 230  Mitral peak gradient, D                  8     mm Hg    ----------  Mitral E/A ratio, peak                   1.9            ----------    Right atrium                             Value          Reference  RA ID, S-I, ES, A4C                      47.2  mm       34 - 49  RA area, ES, A4C                         14.7  cm^2     8.3 - 19.5  RA volume, ES, A/L                       37.4  ml       ----------  RA volume/bsa, ES, A/L                   19.6   ml/m^2   ----------    Systemic veins                           Value          Reference  Estimated CVP                            3     mm Hg    ----------    Right ventricle                          Value          Reference  TAPSE                                    28.1  mm       ----------  RV s&', lateral, S                        20.1  cm/s     ----------  Legend: (L)  and  (H)  mark values outside specified reference range.  ------------------------------------------------------------------- Prepared and Electronically Authenticated by  Tobias Alexander, M.D. 2019-11-18T15:50:59       Cardiac TAVR CT  TECHNIQUE: The patient  was scanned on a [employer]. A 120 kV retrospective scan was triggered in the descending thoracic aorta at 111 HU's. Gantry rotation speed was 250 msecs and collimation was .6 mm. No beta blockade or nitro were given. The 3D data set was reconstructed in 5% intervals of the R-R cycle. Systolic and diastolic phases were analyzed on a dedicated work station using MPR, MIP and VRT modes. The patient received 80 cc of contrast.  FINDINGS: Aortic Valve: Trileaflet aortic valve with severely thickened and calcified leaflets and severely restricted leaflets opening. No calcifications are extending into the LVOT.  Aorta: Normal size, no dissection, mild diffuse calcifications and atheroma in the ascending aorta and aortic arch, severe atherosclerotic plaque in the descending aorta.  Sinotubular Junction: 25 x 23 mm  Ascending Thoracic Aorta: 31 x 31 mm  Aortic Arch: 26 x 25 mm  Descending Thoracic Aorta: 24 x 24 mm  Sinus of Valsalva Measurements:  Non-coronary: 27 mm  Right -coronary: 27 mm  Left -coronary: 29 mm  Coronary Artery Height above Annulus:  Left Main: 14 mm  Right Coronary: 15 mm  Virtual Basal Annulus Measurements:  Maximum/Minimum Diameter: 26.9 x 19.8 mm  Mean Diameter: 23.5  mm  Perimeter: 75.9 mm  Area: 433 mm2  IMPRESSION: 1. Trileaflet aortic valve with severely thickened and calcified leaflets and severely restricted leaflets opening. No calcifications are extending into the LVOT.  2. Thoracic aorta has normal size, no dissection, mild diffuse calcifications and atheroma in the ascending aorta and aortic arch and severe atherosclerotic plaque in the descending aorta.  3. No thrombus in the left atrial appendage.   Electronically Signed   By: Tobias Alexander   On: 11/15/2018 16:38   CT ANGIOGRAPHY CHEST, ABDOMEN AND PELVIS  TECHNIQUE: Multidetector CT imaging through the chest, abdomen and pelvis was performed using the standard protocol during bolus administration of intravenous contrast. Multiplanar reconstructed images and MIPs were obtained and reviewed to evaluate the vascular anatomy.  CONTRAST:  ISOVUE-370 IOPAMIDOL (ISOVUE-370) INJECTION 76%  COMPARISON:  None.  FINDINGS: CTA CHEST FINDINGS  Cardiovascular: Heart size is normal. There is no significant pericardial fluid, thickening or pericardial calcification. There is aortic atherosclerosis, as well as atherosclerosis of the great vessels of the mediastinum and the coronary arteries, including calcified atherosclerotic plaque in the left main, left anterior descending, left circumflex and right coronary arteries. Severe thickening calcification of the aortic valve.  Mediastinum/Lymph Nodes: No pathologically enlarged mediastinal or hilar lymph nodes. Esophagus is unremarkable in appearance. No axillary lymphadenopathy.  Lungs/Pleura: Small bilateral pleural effusions lying dependently. No acute consolidative airspace disease. No suspicious appearing pulmonary nodules or masses.  Musculoskeletal/Soft Tissues: There are no aggressive appearing lytic or blastic lesions noted in the visualized portions of the skeleton.  CTA ABDOMEN AND PELVIS  FINDINGS  Hepatobiliary: Calcified granuloma in the periphery of segment 8 of the liver. No suspicious cystic or solid hepatic lesions. No intra or extrahepatic biliary ductal dilatation. Gallbladder is normal in appearance.  Pancreas: No pancreatic mass. No pancreatic ductal dilatation. No pancreatic or peripancreatic fluid or inflammatory changes.  Spleen: Unremarkable.  Adrenals/Urinary Tract: Multiple nonobstructive calculi are noted within the collecting systems of both kidneys, measuring up to 8 mm in the lower pole collecting system of left kidney. No calculi are identified along the course of either ureter or within the lumen of the urinary bladder. No suspicious renal lesions on postcontrast images. No hydroureteronephrosis. Urinary bladder is normal in appearance. Bilateral adrenal glands  are normal in appearance.  Stomach/Bowel: Normal appearance of the stomach. No pathologic dilatation of small bowel or colon. Normal appendix.  Vascular/Lymphatic: Aortic atherosclerosis, without evidence of aneurysm or dissection in the abdominal or pelvic vasculature. Vascular findings and measurements pertinent to potential TAVR procedure, as detailed below. No lymphadenopathy noted in the abdomen or pelvis.  Reproductive: Prostate gland and seminal vesicles are unremarkable in appearance.  Other: Trace volume of ascites.  No pneumoperitoneum.  Musculoskeletal: There are no aggressive appearing lytic or blastic lesions noted in the visualized portions of the skeleton.  VASCULAR MEASUREMENTS PERTINENT TO TAVR:  AORTA:  Minimal Aortic Diameter-12 x 16 mm  Severity of Aortic Calcification-severe  RIGHT PELVIS:  Right Common Iliac Artery -  Minimal Diameter-7.9 x 7.2 mm  Tortuosity-mild  Calcification-moderate  Right External Iliac Artery -  Minimal Diameter-5.6 x 6.4 mm  Tortuosity - mild  Calcification-mild  Right Common Femoral Artery  -  Minimal Diameter-6.3 x 3.1 mm  Tortuosity - mild  Calcification-severe  LEFT PELVIS:  Left Common Iliac Artery -  Minimal Diameter-6.5 x 5.5 mm  Tortuosity - mild  Calcification - moderate  Left External Iliac Artery -  Minimal Diameter-5.0 x 4.6 mm  Tortuosity - mild  Calcification - moderate  Left Common Femoral Artery -  Minimal Diameter-5.3 x 3.4 mm  Tortuosity - mild  Calcification - moderate  Review of the MIP images confirms the above findings.  IMPRESSION: 1. Vascular findings and measurements pertinent to potential TAVR procedure, as detailed above. 2. Severe thickening calcification of the aortic valve, compatible with the reported clinical history of severe aortic stenosis. 3. Aortic atherosclerosis, in addition to left main and 3 vessel coronary artery disease. Please note that although the presence of coronary artery calcium documents the presence of coronary artery disease, the severity of this disease and any potential stenosis cannot be assessed on this non-gated CT examination. Assessment for potential risk factor modification, dietary therapy or pharmacologic therapy may be warranted, if clinically indicated. 4. Small bilateral pleural effusions lying dependently. 5. Multiple nonobstructive calculi are noted within the collecting systems of both kidneys measuring up to 8 mm in the lower pole the left kidney. No ureteral stones or findings of urinary tract obstruction are noted at this time. 6. Additional incidental findings, as above.   Electronically Signed   By: Trudie Reed M.D.   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  I  have personally reviewed the patient's transthoracic echocardiogram, diagnostic cardiac catheterization, and CT angiograms.  Echocardiogram confirmed the presence of severe aortic stenosis with mild left ventricular systolic dysfunction.  Ejection fraction was estimated  50 to 55%.  The aortic valve was trileaflet.  There was severely thickened and calcified leaflets with extremely diminished leaflet mobility.  Peak velocity across the aortic valve measured 4.2 m/s corresponding to mean transvalvular gradient estimated greater than 45 mmHg.  There was mild to moderate aortic insufficiency and mild to moderate mitral regurgitation.  Diagnostic cardiac catheterization revealed severe multivessel coronary artery disease including high-grade 70 to 90% stenosis of the mid left anterior descending coronary artery.    CT angiography reveals normal-sized aorta without any significant aneurysmal disease or calcification involving the aorta or aortic root.  Options include conventional surgical aortic valve replacement and  coronary artery bypass grafting versus PCI and stenting of the left anterior descending coronary artery with transcatheter aortic valve replacement.  The remainder of the patient's coronary artery disease could be managed medically.  Risks associated with conventional surgery should be relatively low.  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  Long-term treatment with dual antiplatelet therapy could be problematic.  I favor proceeding with conventional surgical aortic valve replacement and coronary artery bypass grafting in the near future, although the patient might be at risk for further bleeding complications.  He will need both upper GI endoscopy and colonoscopy performed at some point in the near future once he has recovered from his surgery.    Plan:  The patient and his daughter and  sister-in-law were counseled at length regarding treatment alternatives for management of severe symptomatic aortic stenosis and multivessel coronary artery disease. Alternative approaches such as conventional aortic valve replacement with CABG, transcatheter aortic valve replacement with PCI, and continued medical therapy without intervention were compared and contrasted at length.  The risks associated with conventional surgery were discussed in detail, as were expectations for post-operative convalescence.  Issues specific to transcatheter aortic valve replacement and PCI were discussed including the need for dual antiplatelet therapy, questions about long term valve durability, the potential for paravalvular leak, possible increased risk of need for permanent pacemaker placement, and other technical complications related to the procedures.  Long-term prognosis with medical therapy was discussed.  The need for formal GI evaluation to include upper GI endoscopy and colonoscopy was discussed.  This discussion was placed in the context of the patient's own specific clinical presentation and past medical history.    Discussion was held comparing the relative risks of mechanical valve replacement with need for lifelong anticoagulation versus use of a bioprosthetic tissue valve and the associated potential for late structural valve deterioration and failure.  This discussion was placed in the context of the patient's particular circumstances, and as a result the patient specifically requests that their valve be replaced using a bioprosthetic tissue valve .  The patient understands and accepts all potential associated risks of surgery including but not limited to risk of death, stroke, myocardial infarction, congestive heart failure, respiratory failure, renal failure, pneumonia, bleeding requiring blood transfusion and or reexploration, arrhythmia, heart block or bradycardia requiring permanent pacemaker, aortic  dissection or other major vascular complication, pleural effusions or other delayed complications related to continued congestive heart failure, late recurrence of ischemic heart disease, and other late complications related to valve replacement including structural valve deterioration and failure, thrombosis, endocarditis, or paravalvular leak.  All questions answered.    I spent in excess of 15 minutes during the conduct of this office consultation and >50% of this time involved direct face-to-face encounter with the patient for counseling and/or coordination of their care.    Salvatore Decent. Cornelius Moras, MD 11/18/2018 3:53 PM

## 2018-11-18 NOTE — Anesthesia Preprocedure Evaluation (Addendum)
Anesthesia Evaluation  Patient identified by MRN, date of birth, ID band Patient awake    Reviewed: Allergy & Precautions, NPO status , Patient's Chart, lab work & pertinent test results  Airway Mallampati: II  TM Distance: <3 FB Neck ROM: Full    Dental  (+) Edentulous Upper, Edentulous Lower   Pulmonary COPD, former smoker,    breath sounds clear to auscultation       Cardiovascular hypertension, Pt. on medications + CAD and + DOE  + Valvular Problems/Murmurs  Rhythm:Regular Rate:Normal + Systolic murmurs There is hypokinesis of the basal anteroseptal, mid anteroseptal,anterior and apical septal walls and of the true apex, overallLVEF 45-50% as the residual walls are hyperdynamic. No apicalthrombus. Aortic stenosis is severe with mild regurgitation.Mitral regurgitation is at least moderate, if surgery is plannedfor AVR, a TEE should be considered for further evaluation ofmitral regurgitation.   Neuro/Psych Anxiety negative neurological ROS     GI/Hepatic Neg liver ROS, GERD  ,Hx AVM's   Endo/Other  negative endocrine ROS  Renal/GU negative Renal ROS     Musculoskeletal   Abdominal   Peds  Hematology  (+) anemia ,   Anesthesia Other Findings   Reproductive/Obstetrics                            Lab Results  Component Value Date   WBC 3.4 (L) 11/16/2018   HGB 8.5 (L) 11/16/2018   HCT 29.3 (L) 11/16/2018   MCV 96.7 11/16/2018   PLT 176 11/16/2018   Lab Results  Component Value Date   CREATININE 1.01 11/16/2018   BUN 6 (L) 11/16/2018   NA 140 11/16/2018   K 3.4 (L) 11/16/2018   CL 109 11/16/2018   CO2 25 11/16/2018    Anesthesia Physical Anesthesia Plan  ASA: IV  Anesthesia Plan: General   Post-op Pain Management:    Induction: Intravenous  PONV Risk Score and Plan: 2 and Ondansetron, Dexamethasone and Treatment may vary due to age or medical condition  Airway  Management Planned: Oral ETT  Additional Equipment: Arterial line, CVP, PA Cath, TEE and Ultrasound Guidance Line Placement  Intra-op Plan:   Post-operative Plan: Post-operative intubation/ventilation  Informed Consent: I have reviewed the patients History and Physical, chart, labs and discussed the procedure including the risks, benefits and alternatives for the proposed anesthesia with the patient or authorized representative who has indicated his/her understanding and acceptance.   Dental advisory given  Plan Discussed with: CRNA  Anesthesia Plan Comments:        Anesthesia Quick Evaluation

## 2018-11-19 ENCOUNTER — Inpatient Hospital Stay (HOSPITAL_COMMUNITY): Payer: No Typology Code available for payment source

## 2018-11-19 ENCOUNTER — Encounter (HOSPITAL_COMMUNITY)
Admission: RE | Disposition: A | Discharge status: Home / Self Care | Payer: Self-pay | Source: Ambulatory Visit | Attending: Thoracic Surgery (Cardiothoracic Vascular Surgery)

## 2018-11-19 ENCOUNTER — Inpatient Hospital Stay (HOSPITAL_COMMUNITY): Payer: No Typology Code available for payment source | Admitting: Certified Registered Nurse Anesthetist

## 2018-11-19 ENCOUNTER — Inpatient Hospital Stay (HOSPITAL_COMMUNITY)
Admission: RE | Admit: 2018-11-19 | Discharge: 2018-11-24 | DRG: 220 | Disposition: A | Discharge status: Home / Self Care | Payer: No Typology Code available for payment source | Source: Ambulatory Visit | Attending: Thoracic Surgery (Cardiothoracic Vascular Surgery) | Admitting: Thoracic Surgery (Cardiothoracic Vascular Surgery)

## 2018-11-19 ENCOUNTER — Encounter (HOSPITAL_COMMUNITY): Payer: Self-pay | Admitting: *Deleted

## 2018-11-19 ENCOUNTER — Inpatient Hospital Stay (HOSPITAL_COMMUNITY): Payer: No Typology Code available for payment source | Admitting: Vascular Surgery

## 2018-11-19 DIAGNOSIS — H9190 Unspecified hearing loss, unspecified ear: Secondary | ICD-10-CM | POA: Diagnosis present

## 2018-11-19 DIAGNOSIS — I700 Atherosclerosis of aorta: Secondary | ICD-10-CM | POA: Diagnosis present

## 2018-11-19 DIAGNOSIS — D62 Acute posthemorrhagic anemia: Secondary | ICD-10-CM | POA: Diagnosis not present

## 2018-11-19 DIAGNOSIS — F1120 Opioid dependence, uncomplicated: Secondary | ICD-10-CM | POA: Diagnosis not present

## 2018-11-19 DIAGNOSIS — D649 Anemia, unspecified: Secondary | ICD-10-CM | POA: Diagnosis present

## 2018-11-19 DIAGNOSIS — J449 Chronic obstructive pulmonary disease, unspecified: Secondary | ICD-10-CM | POA: Diagnosis present

## 2018-11-19 DIAGNOSIS — I350 Nonrheumatic aortic (valve) stenosis: Secondary | ICD-10-CM

## 2018-11-19 DIAGNOSIS — Z7982 Long term (current) use of aspirin: Secondary | ICD-10-CM

## 2018-11-19 DIAGNOSIS — I471 Supraventricular tachycardia: Secondary | ICD-10-CM | POA: Diagnosis not present

## 2018-11-19 DIAGNOSIS — F4310 Post-traumatic stress disorder, unspecified: Secondary | ICD-10-CM | POA: Diagnosis present

## 2018-11-19 DIAGNOSIS — I4891 Unspecified atrial fibrillation: Secondary | ICD-10-CM | POA: Diagnosis not present

## 2018-11-19 DIAGNOSIS — Z87891 Personal history of nicotine dependence: Secondary | ICD-10-CM

## 2018-11-19 DIAGNOSIS — M1990 Unspecified osteoarthritis, unspecified site: Secondary | ICD-10-CM | POA: Diagnosis not present

## 2018-11-19 DIAGNOSIS — E785 Hyperlipidemia, unspecified: Secondary | ICD-10-CM | POA: Diagnosis present

## 2018-11-19 DIAGNOSIS — R0902 Hypoxemia: Secondary | ICD-10-CM | POA: Diagnosis not present

## 2018-11-19 DIAGNOSIS — K219 Gastro-esophageal reflux disease without esophagitis: Secondary | ICD-10-CM | POA: Diagnosis not present

## 2018-11-19 DIAGNOSIS — J9811 Atelectasis: Secondary | ICD-10-CM | POA: Diagnosis not present

## 2018-11-19 DIAGNOSIS — I358 Other nonrheumatic aortic valve disorders: Secondary | ICD-10-CM | POA: Diagnosis not present

## 2018-11-19 DIAGNOSIS — Z8349 Family history of other endocrine, nutritional and metabolic diseases: Secondary | ICD-10-CM

## 2018-11-19 DIAGNOSIS — I480 Paroxysmal atrial fibrillation: Secondary | ICD-10-CM | POA: Diagnosis not present

## 2018-11-19 DIAGNOSIS — Z888 Allergy status to other drugs, medicaments and biological substances status: Secondary | ICD-10-CM

## 2018-11-19 DIAGNOSIS — I080 Rheumatic disorders of both mitral and aortic valves: Secondary | ICD-10-CM | POA: Diagnosis not present

## 2018-11-19 DIAGNOSIS — I2510 Atherosclerotic heart disease of native coronary artery without angina pectoris: Secondary | ICD-10-CM | POA: Diagnosis not present

## 2018-11-19 DIAGNOSIS — Z880 Allergy status to penicillin: Secondary | ICD-10-CM

## 2018-11-19 DIAGNOSIS — Z8249 Family history of ischemic heart disease and other diseases of the circulatory system: Secondary | ICD-10-CM | POA: Diagnosis not present

## 2018-11-19 DIAGNOSIS — Z09 Encounter for follow-up examination after completed treatment for conditions other than malignant neoplasm: Secondary | ICD-10-CM

## 2018-11-19 DIAGNOSIS — I25118 Atherosclerotic heart disease of native coronary artery with other forms of angina pectoris: Secondary | ICD-10-CM | POA: Diagnosis not present

## 2018-11-19 DIAGNOSIS — Z951 Presence of aortocoronary bypass graft: Secondary | ICD-10-CM

## 2018-11-19 DIAGNOSIS — Z953 Presence of xenogenic heart valve: Secondary | ICD-10-CM | POA: Diagnosis not present

## 2018-11-19 DIAGNOSIS — I10 Essential (primary) hypertension: Secondary | ICD-10-CM | POA: Diagnosis present

## 2018-11-19 HISTORY — DX: Presence of xenogenic heart valve: Z95.3

## 2018-11-19 HISTORY — PX: TEE WITHOUT CARDIOVERSION: SHX5443

## 2018-11-19 HISTORY — DX: Presence of aortocoronary bypass graft: Z95.1

## 2018-11-19 HISTORY — PX: CORONARY ARTERY BYPASS GRAFT: SHX141

## 2018-11-19 HISTORY — PX: AORTIC VALVE REPLACEMENT: SHX41

## 2018-11-19 LAB — CBC
HEMATOCRIT: 30.7 % — AB (ref 39.0–52.0)
HEMATOCRIT: 37.7 % — AB (ref 39.0–52.0)
HEMOGLOBIN: 11.7 g/dL — AB (ref 13.0–17.0)
Hemoglobin: 9.3 g/dL — ABNORMAL LOW (ref 13.0–17.0)
MCH: 27.8 pg (ref 26.0–34.0)
MCH: 28.3 pg (ref 26.0–34.0)
MCHC: 30.3 g/dL (ref 30.0–36.0)
MCHC: 31 g/dL (ref 30.0–36.0)
MCV: 91.9 fL (ref 80.0–100.0)
MCV: 91.1 fL (ref 80.0–100.0)
NRBC: 0 % (ref 0.0–0.2)
PLATELETS: 96 10*3/uL — AB (ref 150–400)
Platelets: 116 10*3/uL — ABNORMAL LOW (ref 150–400)
RBC: 3.34 MIL/uL — ABNORMAL LOW (ref 4.22–5.81)
RBC: 4.14 MIL/uL — AB (ref 4.22–5.81)
RDW: 14.7 % (ref 11.5–15.5)
RDW: 14.4 % (ref 11.5–15.5)
WBC: 7 10*3/uL (ref 4.0–10.5)
WBC: 10 10*3/uL (ref 4.0–10.5)
nRBC: 0 % (ref 0.0–0.2)

## 2018-11-19 LAB — POCT I-STAT 3, ART BLOOD GAS (G3+)
ACID-BASE DEFICIT: 2 mmol/L (ref 0.0–2.0)
ACID-BASE EXCESS: 4 mmol/L — AB (ref 0.0–2.0)
Acid-base deficit: 3 mmol/L — ABNORMAL HIGH (ref 0.0–2.0)
Acid-base deficit: 4 mmol/L — ABNORMAL HIGH (ref 0.0–2.0)
BICARBONATE: 28.7 mmol/L — AB (ref 20.0–28.0)
BICARBONATE: 21 mmol/L (ref 20.0–28.0)
BICARBONATE: 21.7 mmol/L (ref 20.0–28.0)
Bicarbonate: 22 mmol/L (ref 20.0–28.0)
O2 SAT: 94 %
O2 Saturation: 100 %
O2 Saturation: 99 %
O2 Saturation: 93 %
PCO2 ART: 39.8 mmHg (ref 32.0–48.0)
PH ART: 7.344 — AB (ref 7.350–7.450)
PO2 ART: 144 mmHg — AB (ref 83.0–108.0)
PO2 ART: 74 mmHg — AB (ref 83.0–108.0)
Patient temperature: 35.7
Patient temperature: 37.3
Patient temperature: 37.7
TCO2: 30 mmol/L (ref 22–32)
TCO2: 22 mmol/L (ref 22–32)
TCO2: 23 mmol/L (ref 22–32)
TCO2: 23 mmol/L (ref 22–32)
pCO2 arterial: 41.1 mmHg (ref 32.0–48.0)
pCO2 arterial: 27.6 mmHg — ABNORMAL LOW (ref 32.0–48.0)
pCO2 arterial: 40.2 mmHg (ref 32.0–48.0)
pH, Arterial: 7.452 — ABNORMAL HIGH (ref 7.350–7.450)
pH, Arterial: 7.483 — ABNORMAL HIGH (ref 7.350–7.450)
pH, Arterial: 7.352 (ref 7.350–7.450)
pO2, Arterial: 405 mmHg — ABNORMAL HIGH (ref 83.0–108.0)
pO2, Arterial: 77 mmHg — ABNORMAL LOW (ref 83.0–108.0)

## 2018-11-19 LAB — POCT I-STAT, CHEM 8
BUN: <3 mg/dL — ABNORMAL LOW (ref 8–23)
BUN: <3 mg/dL — ABNORMAL LOW (ref 8–23)
BUN: <3 mg/dL — ABNORMAL LOW (ref 8–23)
BUN: <3 mg/dL — ABNORMAL LOW (ref 8–23)
BUN: 7 mg/dL — AB (ref 8–23)
CALCIUM ION: 1.03 mmol/L — AB (ref 1.15–1.40)
CALCIUM ION: 1.06 mmol/L — AB (ref 1.15–1.40)
CALCIUM ION: 0.99 mmol/L — AB (ref 1.15–1.40)
CHLORIDE: 105 mmol/L (ref 98–111)
CREATININE: 1 mg/dL (ref 0.61–1.24)
CREATININE: 0.9 mg/dL (ref 0.61–1.24)
CREATININE: 0.9 mg/dL (ref 0.61–1.24)
CREATININE: 0.9 mg/dL (ref 0.61–1.24)
Calcium, Ion: 1.2 mmol/L (ref 1.15–1.40)
Calcium, Ion: 0.98 mmol/L — ABNORMAL LOW (ref 1.15–1.40)
Calcium, Ion: 1.18 mmol/L (ref 1.15–1.40)
Calcium, Ion: 1.11 mmol/L — ABNORMAL LOW (ref 1.15–1.40)
Chloride: 103 mmol/L (ref 98–111)
Chloride: 101 mmol/L (ref 98–111)
Chloride: 102 mmol/L (ref 98–111)
Chloride: 99 mmol/L (ref 98–111)
Chloride: 103 mmol/L (ref 98–111)
Chloride: 108 mmol/L (ref 98–111)
Creatinine, Ser: 0.8 mg/dL (ref 0.61–1.24)
Creatinine, Ser: 0.8 mg/dL (ref 0.61–1.24)
Creatinine, Ser: 0.9 mg/dL (ref 0.61–1.24)
GLUCOSE: 103 mg/dL — AB (ref 70–99)
GLUCOSE: 116 mg/dL — AB (ref 70–99)
GLUCOSE: 121 mg/dL — AB (ref 70–99)
Glucose, Bld: 140 mg/dL — ABNORMAL HIGH (ref 70–99)
Glucose, Bld: 122 mg/dL — ABNORMAL HIGH (ref 70–99)
Glucose, Bld: 128 mg/dL — ABNORMAL HIGH (ref 70–99)
Glucose, Bld: 135 mg/dL — ABNORMAL HIGH (ref 70–99)
HCT: 21 % — ABNORMAL LOW (ref 39.0–52.0)
HCT: 23 % — ABNORMAL LOW (ref 39.0–52.0)
HCT: 21 % — ABNORMAL LOW (ref 39.0–52.0)
HCT: 21 % — ABNORMAL LOW (ref 39.0–52.0)
HEMATOCRIT: 24 % — AB (ref 39.0–52.0)
HEMATOCRIT: 22 % — AB (ref 39.0–52.0)
HEMATOCRIT: 33 % — AB (ref 39.0–52.0)
HEMOGLOBIN: 7.8 g/dL — AB (ref 13.0–17.0)
HEMOGLOBIN: 7.1 g/dL — AB (ref 13.0–17.0)
HEMOGLOBIN: 7.5 g/dL — AB (ref 13.0–17.0)
HEMOGLOBIN: 11.2 g/dL — AB (ref 13.0–17.0)
Hemoglobin: 8.2 g/dL — ABNORMAL LOW (ref 13.0–17.0)
Hemoglobin: 7.1 g/dL — ABNORMAL LOW (ref 13.0–17.0)
Hemoglobin: 7.1 g/dL — ABNORMAL LOW (ref 13.0–17.0)
POTASSIUM: 4.2 mmol/L (ref 3.5–5.1)
POTASSIUM: 3.5 mmol/L (ref 3.5–5.1)
POTASSIUM: 4.4 mmol/L (ref 3.5–5.1)
Potassium: 3.9 mmol/L (ref 3.5–5.1)
Potassium: 4.2 mmol/L (ref 3.5–5.1)
Potassium: 4.3 mmol/L (ref 3.5–5.1)
Potassium: 4 mmol/L (ref 3.5–5.1)
SODIUM: 139 mmol/L (ref 135–145)
SODIUM: 141 mmol/L (ref 135–145)
Sodium: 140 mmol/L (ref 135–145)
Sodium: 141 mmol/L (ref 135–145)
Sodium: 142 mmol/L (ref 135–145)
Sodium: 139 mmol/L (ref 135–145)
Sodium: 140 mmol/L (ref 135–145)
TCO2: 29 mmol/L (ref 22–32)
TCO2: 27 mmol/L (ref 22–32)
TCO2: 30 mmol/L (ref 22–32)
TCO2: 28 mmol/L (ref 22–32)
TCO2: 28 mmol/L (ref 22–32)
TCO2: 29 mmol/L (ref 22–32)
TCO2: 23 mmol/L (ref 22–32)

## 2018-11-19 LAB — POCT I-STAT 4, (NA,K, GLUC, HGB,HCT)
Glucose, Bld: 101 mg/dL — ABNORMAL HIGH (ref 70–99)
HCT: 26 % — ABNORMAL LOW (ref 39.0–52.0)
Hemoglobin: 8.8 g/dL — ABNORMAL LOW (ref 13.0–17.0)
Potassium: 3.6 mmol/L (ref 3.5–5.1)
SODIUM: 143 mmol/L (ref 135–145)

## 2018-11-19 LAB — HEMOGLOBIN AND HEMATOCRIT, BLOOD
HCT: 22.5 % — ABNORMAL LOW (ref 39.0–52.0)
Hemoglobin: 6.9 g/dL — CL (ref 13.0–17.0)

## 2018-11-19 LAB — GLUCOSE, CAPILLARY
GLUCOSE-CAPILLARY: 144 mg/dL — AB (ref 70–99)
GLUCOSE-CAPILLARY: 114 mg/dL — AB (ref 70–99)
Glucose-Capillary: 88 mg/dL (ref 70–99)
Glucose-Capillary: 131 mg/dL — ABNORMAL HIGH (ref 70–99)
Glucose-Capillary: 126 mg/dL — ABNORMAL HIGH (ref 70–99)
Glucose-Capillary: 130 mg/dL — ABNORMAL HIGH (ref 70–99)
Glucose-Capillary: 94 mg/dL (ref 70–99)
Glucose-Capillary: 122 mg/dL — ABNORMAL HIGH (ref 70–99)
Glucose-Capillary: 128 mg/dL — ABNORMAL HIGH (ref 70–99)

## 2018-11-19 LAB — MAGNESIUM: MAGNESIUM: 3.4 mg/dL — AB (ref 1.7–2.4)

## 2018-11-19 LAB — PREPARE RBC (CROSSMATCH)

## 2018-11-19 LAB — PLATELET COUNT: Platelets: 102 10*3/uL — ABNORMAL LOW (ref 150–400)

## 2018-11-19 LAB — CREATININE, SERUM
Creatinine, Ser: 1.08 mg/dL (ref 0.61–1.24)
GFR calc Af Amer: >60 mL/min (ref >60)
GFR calc non Af Amer: >60 mL/min (ref >60)

## 2018-11-19 LAB — APTT: aPTT: 39 seconds — ABNORMAL HIGH (ref 24–36)

## 2018-11-19 LAB — PROTIME-INR
INR: 1.71
PROTHROMBIN TIME: 19.8 s — AB (ref 11.4–15.2)

## 2018-11-19 SURGERY — REPLACEMENT, AORTIC VALVE, OPEN
Anesthesia: General | Site: Chest

## 2018-11-19 MED ORDER — DEXMEDETOMIDINE HCL IN NACL 200 MCG/50ML IV SOLN
0.0000 ug/kg/h | INTRAVENOUS | Status: DC
  Administered 2018-11-19: 0.2 ug/kg/h via INTRAVENOUS
  Filled 2018-11-19: qty 50

## 2018-11-19 MED ORDER — METOPROLOL TARTRATE 12.5 MG HALF TABLET
12.5000 mg | ORAL_TABLET | Freq: Once | ORAL | Status: AC
  Administered 2018-11-19: 12.5 mg via ORAL
  Filled 2018-11-19: qty 1

## 2018-11-19 MED ORDER — HEMOSTATIC AGENTS (NO CHARGE) OPTIME
TOPICAL | Status: DC | PRN
  Administered 2018-11-19: 3 via TOPICAL
  Administered 2018-11-19: 2 via TOPICAL

## 2018-11-19 MED ORDER — LACTATED RINGERS IV SOLN
INTRAVENOUS | Status: DC
  Administered 2018-11-20: 01:00:00 via INTRAVENOUS

## 2018-11-19 MED ORDER — FENTANYL CITRATE (PF) 250 MCG/5ML IJ SOLN
INTRAMUSCULAR | Status: DC | PRN
  Administered 2018-11-19: 100 ug via INTRAVENOUS
  Administered 2018-11-19 (×2): 200 ug via INTRAVENOUS
  Administered 2018-11-19: 50 ug via INTRAVENOUS
  Administered 2018-11-19: 150 ug via INTRAVENOUS
  Administered 2018-11-19 (×5): 100 ug via INTRAVENOUS
  Administered 2018-11-19: 50 ug via INTRAVENOUS

## 2018-11-19 MED ORDER — HEPARIN SODIUM (PORCINE) 1000 UNIT/ML IJ SOLN
INTRAMUSCULAR | Status: DC | PRN
  Administered 2018-11-19: 25000 [IU] via INTRAVENOUS

## 2018-11-19 MED ORDER — TRANEXAMIC ACID 1000 MG/10ML IV SOLN: INTRAVENOUS | Status: DC | PRN

## 2018-11-19 MED ORDER — LEVOFLOXACIN IN D5W 750 MG/150ML IV SOLN
750.0000 mg | INTRAVENOUS | Status: AC
  Administered 2018-11-20: 750 mg via INTRAVENOUS
  Filled 2018-11-19: qty 150

## 2018-11-19 MED ORDER — SODIUM CHLORIDE 0.9 % IV SOLN
INTRAVENOUS | Status: DC | PRN
  Administered 2018-11-19: 1.6 [IU]/h via INTRAVENOUS

## 2018-11-19 MED ORDER — NITROGLYCERIN IN D5W 200-5 MCG/ML-% IV SOLN
INTRAVENOUS | Status: DC | PRN
  Administered 2018-11-19: 5 ug/min via INTRAVENOUS

## 2018-11-19 MED ORDER — CHLORHEXIDINE GLUCONATE 0.12 % MT SOLN
OROMUCOSAL | Status: AC
  Administered 2018-11-19: 15 mL
  Filled 2018-11-19: qty 15

## 2018-11-19 MED ORDER — SODIUM CHLORIDE 0.9 % IR SOLN
Status: DC | PRN
  Administered 2018-11-19: 6000 mL

## 2018-11-19 MED ORDER — BISACODYL 5 MG PO TBEC
10.0000 mg | DELAYED_RELEASE_TABLET | Freq: Every day | ORAL | Status: DC
  Administered 2018-11-21 – 2018-11-24 (×4): 10 mg via ORAL
  Filled 2018-11-19 (×5): qty 2

## 2018-11-19 MED ORDER — VANCOMYCIN HCL IN DEXTROSE 1-5 GM/200ML-% IV SOLN
1000.0000 mg | Freq: Once | INTRAVENOUS | Status: AC
  Administered 2018-11-19: 1000 mg via INTRAVENOUS
  Filled 2018-11-19: qty 200

## 2018-11-19 MED ORDER — ACETAMINOPHEN 160 MG/5ML PO SOLN: 650.0000 mg | Freq: Once | ORAL | Status: AC

## 2018-11-19 MED ORDER — VECURONIUM BROMIDE 10 MG IV SOLR
INTRAVENOUS | Status: AC
  Filled 2018-11-19: qty 10

## 2018-11-19 MED ORDER — PROPOFOL 10 MG/ML IV BOLUS
INTRAVENOUS | Status: AC
  Filled 2018-11-19: qty 20

## 2018-11-19 MED ORDER — DEXMEDETOMIDINE HCL IN NACL 200 MCG/50ML IV SOLN
INTRAVENOUS | Status: DC | PRN
  Administered 2018-11-19: .2 ug/kg/h via INTRAVENOUS

## 2018-11-19 MED ORDER — SODIUM CHLORIDE 0.9 % IV SOLN: INTRAVENOUS | Status: AC

## 2018-11-19 MED ORDER — LACTATED RINGERS IV SOLN
INTRAVENOUS | Status: DC | PRN
  Administered 2018-11-19: 07:00:00 via INTRAVENOUS

## 2018-11-19 MED ORDER — MIDAZOLAM HCL 2 MG/2ML IJ SOLN
INTRAMUSCULAR | Status: DC | PRN
  Administered 2018-11-19: 3 mg via INTRAVENOUS
  Administered 2018-11-19 (×2): 2 mg via INTRAVENOUS
  Administered 2018-11-19: 3 mg via INTRAVENOUS

## 2018-11-19 MED ORDER — BISACODYL 10 MG RE SUPP: 10.0000 mg | Freq: Every day | RECTAL | Status: DC

## 2018-11-19 MED ORDER — CHLORHEXIDINE GLUCONATE 4 % EX LIQD: 30.0000 mL | CUTANEOUS | Status: DC

## 2018-11-19 MED ORDER — ROCURONIUM BROMIDE 50 MG/5ML IV SOSY
PREFILLED_SYRINGE | INTRAVENOUS | Status: AC
  Filled 2018-11-19: qty 5

## 2018-11-19 MED ORDER — LIDOCAINE 2% (20 MG/ML) 5 ML SYRINGE
INTRAMUSCULAR | Status: AC
  Filled 2018-11-19: qty 5

## 2018-11-19 MED ORDER — HEPARIN SODIUM (PORCINE) 1000 UNIT/ML IJ SOLN
INTRAMUSCULAR | Status: AC
  Filled 2018-11-19: qty 1

## 2018-11-19 MED ORDER — ASPIRIN EC 325 MG PO TBEC
325.0000 mg | DELAYED_RELEASE_TABLET | Freq: Every day | ORAL | Status: DC
  Administered 2018-11-20 – 2018-11-22 (×3): 325 mg via ORAL
  Filled 2018-11-19 (×3): qty 1

## 2018-11-19 MED ORDER — FENTANYL CITRATE (PF) 250 MCG/5ML IJ SOLN
INTRAMUSCULAR | Status: AC
  Filled 2018-11-19: qty 25

## 2018-11-19 MED ORDER — SODIUM CHLORIDE 0.45 % IV SOLN: INTRAVENOUS | Status: DC | PRN

## 2018-11-19 MED ORDER — SODIUM CHLORIDE 0.9% IV SOLUTION: Freq: Once | INTRAVENOUS | Status: DC

## 2018-11-19 MED ORDER — SODIUM CHLORIDE (PF) 0.9 % IJ SOLN
INTRAMUSCULAR | Status: AC
  Filled 2018-11-19: qty 10

## 2018-11-19 MED ORDER — VECURONIUM BROMIDE 10 MG IV SOLR
INTRAVENOUS | Status: DC | PRN
  Administered 2018-11-19: 6 mg via INTRAVENOUS
  Administered 2018-11-19: 3 mg via INTRAVENOUS

## 2018-11-19 MED ORDER — MORPHINE SULFATE (PF) 2 MG/ML IV SOLN
1.0000 mg | INTRAVENOUS | Status: DC | PRN
  Administered 2018-11-19 – 2018-11-20 (×13): 4 mg via INTRAVENOUS
  Filled 2018-11-19 (×13): qty 2

## 2018-11-19 MED ORDER — ROCURONIUM BROMIDE 10 MG/ML (PF) SYRINGE
PREFILLED_SYRINGE | INTRAVENOUS | Status: DC | PRN
  Administered 2018-11-19 (×2): 50 mg via INTRAVENOUS

## 2018-11-19 MED ORDER — SODIUM CHLORIDE 0.9% FLUSH: 3.0000 mL | INTRAVENOUS | Status: DC | PRN

## 2018-11-19 MED ORDER — ACETAMINOPHEN 650 MG RE SUPP
650.0000 mg | Freq: Once | RECTAL | Status: AC
  Administered 2018-11-19: 650 mg via RECTAL

## 2018-11-19 MED ORDER — SODIUM CHLORIDE 0.9 % IV SOLN
INTRAVENOUS | Status: DC
  Administered 2018-11-19: 13:00:00 via INTRAVENOUS

## 2018-11-19 MED ORDER — ONDANSETRON HCL 4 MG/2ML IJ SOLN
INTRAMUSCULAR | Status: AC
  Filled 2018-11-19: qty 2

## 2018-11-19 MED ORDER — SODIUM CHLORIDE 0.9 % IV SOLN: 250.0000 mL | INTRAVENOUS | Status: DC

## 2018-11-19 MED ORDER — METOPROLOL TARTRATE 5 MG/5ML IV SOLN: 2.5000 mg | INTRAVENOUS | Status: DC | PRN

## 2018-11-19 MED ORDER — FAMOTIDINE IN NACL 20-0.9 MG/50ML-% IV SOLN: 20.0000 mg | Freq: Two times a day (BID) | INTRAVENOUS | Status: DC

## 2018-11-19 MED ORDER — PROTAMINE SULFATE 10 MG/ML IV SOLN
INTRAVENOUS | Status: DC | PRN
  Administered 2018-11-19: 40 mg via INTRAVENOUS
  Administered 2018-11-19 (×2): 100 mg via INTRAVENOUS
  Administered 2018-11-19: 10 mg via INTRAVENOUS

## 2018-11-19 MED ORDER — LIDOCAINE 2% (20 MG/ML) 5 ML SYRINGE
INTRAMUSCULAR | Status: DC | PRN
  Administered 2018-11-19: 100 mg via INTRAVENOUS

## 2018-11-19 MED ORDER — ACETAMINOPHEN 160 MG/5ML PO SOLN: 1000.0000 mg | Freq: Four times a day (QID) | ORAL | Status: DC

## 2018-11-19 MED ORDER — NITROGLYCERIN IN D5W 200-5 MCG/ML-% IV SOLN: 0.0000 ug/min | INTRAVENOUS | Status: DC

## 2018-11-19 MED ORDER — PHENYLEPHRINE HCL-NACL 20-0.9 MG/250ML-% IV SOLN: 0.0000 ug/min | INTRAVENOUS | Status: DC

## 2018-11-19 MED ORDER — POTASSIUM CHLORIDE 10 MEQ/50ML IV SOLN
10.0000 meq | INTRAVENOUS | Status: AC
  Administered 2018-11-19 (×3): 10 meq via INTRAVENOUS

## 2018-11-19 MED ORDER — PANTOPRAZOLE SODIUM 40 MG PO TBEC
40.0000 mg | DELAYED_RELEASE_TABLET | Freq: Every day | ORAL | Status: DC
  Administered 2018-11-21 – 2018-11-24 (×4): 40 mg via ORAL
  Filled 2018-11-19 (×4): qty 1

## 2018-11-19 MED ORDER — DOCUSATE SODIUM 100 MG PO CAPS
200.0000 mg | ORAL_CAPSULE | Freq: Every day | ORAL | Status: DC
  Administered 2018-11-21 – 2018-11-24 (×4): 200 mg via ORAL
  Filled 2018-11-19 (×5): qty 2

## 2018-11-19 MED ORDER — CHLORHEXIDINE GLUCONATE 0.12 % MT SOLN: 15.0000 mL | Freq: Once | OROMUCOSAL | Status: DC

## 2018-11-19 MED ORDER — ACETAMINOPHEN 500 MG PO TABS
1000.0000 mg | ORAL_TABLET | Freq: Four times a day (QID) | ORAL | Status: DC
  Administered 2018-11-19 – 2018-11-24 (×7): 1000 mg via ORAL
  Filled 2018-11-19 (×8): qty 2

## 2018-11-19 MED ORDER — SODIUM CHLORIDE 0.9% FLUSH
3.0000 mL | Freq: Two times a day (BID) | INTRAVENOUS | Status: DC
  Administered 2018-11-20 – 2018-11-21 (×3): 3 mL via INTRAVENOUS

## 2018-11-19 MED ORDER — INSULIN REGULAR BOLUS VIA INFUSION
0.0000 [IU] | Freq: Three times a day (TID) | INTRAVENOUS | Status: DC
  Filled 2018-11-19: qty 10

## 2018-11-19 MED ORDER — MIDAZOLAM HCL 2 MG/2ML IJ SOLN: 2.0000 mg | INTRAMUSCULAR | Status: DC | PRN

## 2018-11-19 MED ORDER — LACTATED RINGERS IV SOLN: 500.0000 mL | Freq: Once | INTRAVENOUS | Status: DC | PRN

## 2018-11-19 MED ORDER — ARTIFICIAL TEARS OPHTHALMIC OINT
TOPICAL_OINTMENT | OPHTHALMIC | Status: DC | PRN
  Administered 2018-11-19: 1 via OPHTHALMIC

## 2018-11-19 MED ORDER — LACTATED RINGERS IV SOLN
INTRAVENOUS | Status: DC
  Administered 2018-11-20: 11:00:00 via INTRAVENOUS

## 2018-11-19 MED ORDER — ONDANSETRON HCL 4 MG/2ML IJ SOLN
4.0000 mg | Freq: Four times a day (QID) | INTRAMUSCULAR | Status: DC | PRN
  Filled 2018-11-19: qty 2

## 2018-11-19 MED ORDER — PROPOFOL 10 MG/ML IV BOLUS
INTRAVENOUS | Status: DC | PRN
  Administered 2018-11-19: 20 mg via INTRAVENOUS
  Administered 2018-11-19: 70 mg via INTRAVENOUS

## 2018-11-19 MED ORDER — SODIUM CHLORIDE 0.9 % IV SOLN
INTRAVENOUS | Status: DC | PRN
  Administered 2018-11-19: 25 ug/min via INTRAVENOUS

## 2018-11-19 MED ORDER — ALBUMIN HUMAN 5 % IV SOLN
250.0000 mL | INTRAVENOUS | Status: AC | PRN
  Administered 2018-11-19: 12.5 g via INTRAVENOUS

## 2018-11-19 MED ORDER — MAGNESIUM SULFATE 4 GM/100ML IV SOLN
4.0000 g | Freq: Once | INTRAVENOUS | Status: AC
  Administered 2018-11-19: 4 g via INTRAVENOUS
  Filled 2018-11-19: qty 100

## 2018-11-19 MED ORDER — PHENYLEPHRINE 40 MCG/ML (10ML) SYRINGE FOR IV PUSH (FOR BLOOD PRESSURE SUPPORT)
PREFILLED_SYRINGE | INTRAVENOUS | Status: DC | PRN
  Administered 2018-11-19: 25 ug via INTRAVENOUS
  Administered 2018-11-19: 40 ug via INTRAVENOUS

## 2018-11-19 MED ORDER — INSULIN REGULAR(HUMAN) IN NACL 100-0.9 UT/100ML-% IV SOLN: INTRAVENOUS | Status: DC

## 2018-11-19 MED ORDER — OXYCODONE HCL 5 MG PO TABS
5.0000 mg | ORAL_TABLET | ORAL | Status: DC | PRN
  Administered 2018-11-19 – 2018-11-24 (×19): 10 mg via ORAL
  Filled 2018-11-19 (×19): qty 2

## 2018-11-19 MED ORDER — CHLORHEXIDINE GLUCONATE 0.12 % MT SOLN
15.0000 mL | OROMUCOSAL | Status: AC
  Administered 2018-11-19: 15 mL via OROMUCOSAL

## 2018-11-19 MED ORDER — ALBUMIN HUMAN 5 % IV SOLN
INTRAVENOUS | Status: DC | PRN
  Administered 2018-11-19: 12:00:00 via INTRAVENOUS

## 2018-11-19 MED ORDER — ASPIRIN 81 MG PO CHEW: 324.0000 mg | CHEWABLE_TABLET | Freq: Every day | ORAL | Status: DC

## 2018-11-19 MED ORDER — TRAMADOL HCL 50 MG PO TABS
50.0000 mg | ORAL_TABLET | ORAL | Status: DC | PRN
  Administered 2018-11-19 – 2018-11-20 (×2): 100 mg via ORAL
  Filled 2018-11-19 (×2): qty 2

## 2018-11-19 MED ORDER — DEXAMETHASONE SODIUM PHOSPHATE 10 MG/ML IJ SOLN
INTRAMUSCULAR | Status: AC
  Filled 2018-11-19: qty 1

## 2018-11-19 MED ORDER — MIDAZOLAM HCL (PF) 10 MG/2ML IJ SOLN
INTRAMUSCULAR | Status: AC
  Filled 2018-11-19: qty 2

## 2018-11-19 SURGICAL SUPPLY — 130 items
ADAPTER CARDIO PERF ANTE/RETRO (ADAPTER) ×6 IMPLANT
ADPR PRFSN 84XANTGRD RTRGD (ADAPTER) ×4
BAG DECANTER FOR FLEXI CONT (MISCELLANEOUS) ×6 IMPLANT
BANDAGE ACE 4X5 VEL STRL LF (GAUZE/BANDAGES/DRESSINGS) ×3 IMPLANT
BANDAGE ACE 6X5 VEL STRL LF (GAUZE/BANDAGES/DRESSINGS) ×3 IMPLANT
BASKET HEART (ORDER IN 25'S) (MISCELLANEOUS) ×1
BASKET HEART (ORDER IN 25S) (MISCELLANEOUS) ×2 IMPLANT
BLADE CLIPPER SURG (BLADE) IMPLANT
BLADE STERNUM SYSTEM 6 (BLADE) ×3 IMPLANT
BLADE SURG 11 STRL SS (BLADE) ×3 IMPLANT
BNDG GAUZE ELAST 4 BULKY (GAUZE/BANDAGES/DRESSINGS) ×3 IMPLANT
CANISTER SUCT 3000ML PPV (MISCELLANEOUS) ×3 IMPLANT
CANNULA EZ GLIDE AORTIC 21FR (CANNULA) ×3 IMPLANT
CANNULA GUNDRY RCSP 15FR (MISCELLANEOUS) ×3 IMPLANT
CANNULA SOFTFLOW AORTIC 7M21FR (CANNULA) IMPLANT
CATH CPB KIT OWEN (MISCELLANEOUS) ×3 IMPLANT
CATH HEART VENT LEFT (CATHETERS) ×2 IMPLANT
CATH THORACIC 36FR (CATHETERS) ×3 IMPLANT
CATH THORACIC 36FR RT ANG (CATHETERS) ×3 IMPLANT
CLIP VESOCCLUDE MED 24/CT (CLIP) IMPLANT
CLIP VESOCCLUDE SM WIDE 24/CT (CLIP) IMPLANT
CONN ST 1/4X3/8  BEN (MISCELLANEOUS) ×2
CONN ST 1/4X3/8 BEN (MISCELLANEOUS) ×4 IMPLANT
COVER SURGICAL LIGHT HANDLE (MISCELLANEOUS) IMPLANT
COVER WAND RF STERILE (DRAPES) ×3 IMPLANT
CRADLE DONUT ADULT HEAD (MISCELLANEOUS) ×3 IMPLANT
DEVICE SUT CK QUICK LOAD MINI (Prosthesis & Implant Heart) ×6 IMPLANT
DRAIN CHANNEL 32F RND 10.7 FF (WOUND CARE) ×6 IMPLANT
DRAPE BILATERAL SPLIT (DRAPES) IMPLANT
DRAPE CARDIOVASCULAR INCISE (DRAPES) ×3
DRAPE CV SPLIT W-CLR ANES SCRN (DRAPES) IMPLANT
DRAPE INCISE IOBAN 66X45 STRL (DRAPES) ×3 IMPLANT
DRAPE SLUSH/WARMER DISC (DRAPES) ×3 IMPLANT
DRAPE SRG 135X102X78XABS (DRAPES) ×2 IMPLANT
DRSG AQUACEL AG ADV 3.5X14 (GAUZE/BANDAGES/DRESSINGS) ×3 IMPLANT
DRSG COVADERM 4X14 (GAUZE/BANDAGES/DRESSINGS) IMPLANT
ELECT BLADE 4.0 EZ CLEAN MEGAD (MISCELLANEOUS) ×3
ELECT REM PT RETURN 9FT ADLT (ELECTROSURGICAL) ×6
ELECTRODE BLDE 4.0 EZ CLN MEGD (MISCELLANEOUS) ×2 IMPLANT
ELECTRODE REM PT RTRN 9FT ADLT (ELECTROSURGICAL) ×4 IMPLANT
FELT TEFLON 1X6 (MISCELLANEOUS) ×3 IMPLANT
GAUZE SPONGE 4X4 12PLY STRL (GAUZE/BANDAGES/DRESSINGS) ×6 IMPLANT
GAUZE SPONGE 4X4 12PLY STRL LF (GAUZE/BANDAGES/DRESSINGS) ×3 IMPLANT
GLOVE BIO SURGEON STRL SZ 6 (GLOVE) ×12 IMPLANT
GLOVE BIO SURGEON STRL SZ 6.5 (GLOVE) ×12 IMPLANT
GLOVE BIO SURGEON STRL SZ7 (GLOVE) IMPLANT
GLOVE BIO SURGEON STRL SZ7.5 (GLOVE) ×12 IMPLANT
GLOVE BIOGEL PI IND STRL 6 (GLOVE) ×12 IMPLANT
GLOVE BIOGEL PI IND STRL 6.5 (GLOVE) ×12 IMPLANT
GLOVE BIOGEL PI INDICATOR 6 (GLOVE) ×6
GLOVE BIOGEL PI INDICATOR 6.5 (GLOVE) ×6
GLOVE ORTHO TXT STRL SZ7.5 (GLOVE) ×9 IMPLANT
GOWN STRL REUS W/ TWL LRG LVL3 (GOWN DISPOSABLE) ×20 IMPLANT
GOWN STRL REUS W/TWL LRG LVL3 (GOWN DISPOSABLE) ×30
HEMOSTAT POWDER SURGIFOAM 1G (HEMOSTASIS) ×15 IMPLANT
INSERT FOGARTY XLG (MISCELLANEOUS) ×3 IMPLANT
KIT BASIN OR (CUSTOM PROCEDURE TRAY) ×3 IMPLANT
KIT SUCTION CATH 14FR (SUCTIONS) ×12 IMPLANT
KIT SUT CK MINI COMBO 4X17 (Prosthesis & Implant Heart) ×3 IMPLANT
KIT TURNOVER KIT B (KITS) ×3 IMPLANT
KIT VASOVIEW HEMOPRO 2 VH 4000 (KITS) ×3 IMPLANT
LEAD PACING MYOCARDI (MISCELLANEOUS) ×3 IMPLANT
LINE VENT (MISCELLANEOUS) ×3 IMPLANT
MARKER GRAFT CORONARY BYPASS (MISCELLANEOUS) ×9 IMPLANT
NS IRRIG 1000ML POUR BTL (IV SOLUTION) ×18 IMPLANT
PACK E OPEN HEART (SUTURE) ×3 IMPLANT
PACK OPEN HEART (CUSTOM PROCEDURE TRAY) ×3 IMPLANT
PAD ARMBOARD 7.5X6 YLW CONV (MISCELLANEOUS) ×6 IMPLANT
PAD ELECT DEFIB RADIOL ZOLL (MISCELLANEOUS) ×3 IMPLANT
PENCIL BUTTON HOLSTER BLD 10FT (ELECTRODE) ×3 IMPLANT
PUNCH AORTIC ROTATE 4.0MM (MISCELLANEOUS) IMPLANT
PUNCH AORTIC ROTATE 4.5MM 8IN (MISCELLANEOUS) IMPLANT
PUNCH AORTIC ROTATE 5MM 8IN (MISCELLANEOUS) IMPLANT
SET CARDIOPLEGIA MPS 5001102 (MISCELLANEOUS) ×3 IMPLANT
SET IRRIG TUBING LAPAROSCOPIC (IRRIGATION / IRRIGATOR) ×3 IMPLANT
SOLUTION ANTI FOG 6CC (MISCELLANEOUS) ×3 IMPLANT
SPONGE LAP 18X18 X RAY DECT (DISPOSABLE) IMPLANT
SPONGE LAP 4X18 RFD (DISPOSABLE) ×3 IMPLANT
SUT BONE WAX W31G (SUTURE) ×3 IMPLANT
SUT ETHIBON 2 0 V 52N 30 (SUTURE) ×6 IMPLANT
SUT ETHIBON EXCEL 2-0 V-5 (SUTURE) ×9 IMPLANT
SUT ETHIBOND 2 0 SH (SUTURE)
SUT ETHIBOND 2 0 SH 36X2 (SUTURE) IMPLANT
SUT ETHIBOND 2 0 V4 (SUTURE) IMPLANT
SUT ETHIBOND 2 0V4 GREEN (SUTURE) IMPLANT
SUT ETHIBOND 4 0 RB 1 (SUTURE) IMPLANT
SUT ETHIBOND V-5 VALVE (SUTURE) IMPLANT
SUT ETHIBOND X763 2 0 SH 1 (SUTURE) ×9 IMPLANT
SUT MNCRL AB 3-0 PS2 18 (SUTURE) ×6 IMPLANT
SUT MNCRL AB 4-0 PS2 18 (SUTURE) IMPLANT
SUT PDS AB 1 CTX 36 (SUTURE) ×6 IMPLANT
SUT PROLENE 2 0 SH DA (SUTURE) IMPLANT
SUT PROLENE 3 0 SH DA (SUTURE) ×3 IMPLANT
SUT PROLENE 3 0 SH1 36 (SUTURE) IMPLANT
SUT PROLENE 4 0 RB 1 (SUTURE) ×21
SUT PROLENE 4 0 SH DA (SUTURE) ×3 IMPLANT
SUT PROLENE 4-0 RB1 .5 CRCL 36 (SUTURE) ×14 IMPLANT
SUT PROLENE 5 0 C 1 36 (SUTURE) IMPLANT
SUT PROLENE 6 0 C 1 30 (SUTURE) ×6 IMPLANT
SUT PROLENE 7.0 RB 3 (SUTURE) ×9 IMPLANT
SUT PROLENE 8 0 BV175 6 (SUTURE) IMPLANT
SUT PROLENE BLUE 7 0 (SUTURE) ×3 IMPLANT
SUT PROLENE POLY MONO (SUTURE) IMPLANT
SUT SILK  1 MH (SUTURE) ×1
SUT SILK 1 MH (SUTURE) ×2 IMPLANT
SUT SILK 2 0 SH CR/8 (SUTURE) IMPLANT
SUT SILK 3 0 SH CR/8 (SUTURE) IMPLANT
SUT STEEL 6MS V (SUTURE) IMPLANT
SUT STEEL STERNAL CCS#1 18IN (SUTURE) IMPLANT
SUT STEEL SZ 6 DBL 3X14 BALL (SUTURE) IMPLANT
SUT VIC AB 1 CTX 36 (SUTURE)
SUT VIC AB 1 CTX36XBRD ANBCTR (SUTURE) IMPLANT
SUT VIC AB 2-0 CT1 27 (SUTURE)
SUT VIC AB 2-0 CT1 TAPERPNT 27 (SUTURE) IMPLANT
SUT VIC AB 2-0 CTX 27 (SUTURE) IMPLANT
SUT VIC AB 3-0 SH 27 (SUTURE)
SUT VIC AB 3-0 SH 27X BRD (SUTURE) IMPLANT
SUT VIC AB 3-0 X1 27 (SUTURE) IMPLANT
SUT VICRYL 4-0 PS2 18IN ABS (SUTURE) IMPLANT
SYSTEM SAHARA CHEST DRAIN ATS (WOUND CARE) ×3 IMPLANT
TAPE CLOTH SURG 4X10 WHT LF (GAUZE/BANDAGES/DRESSINGS) ×3 IMPLANT
TAPE PAPER 2X10 WHT MICROPORE (GAUZE/BANDAGES/DRESSINGS) ×3 IMPLANT
TOWEL GREEN STERILE (TOWEL DISPOSABLE) ×3 IMPLANT
TOWEL GREEN STERILE FF (TOWEL DISPOSABLE) IMPLANT
TRAY FOLEY SLVR 16FR TEMP STAT (SET/KITS/TRAYS/PACK) ×3 IMPLANT
TUBING INSUFFLATION (TUBING) ×3 IMPLANT
UNDERPAD 30X30 (UNDERPADS AND DIAPERS) ×3 IMPLANT
VALVE AORTIC SZ21 INSP/RESIL (Valve) ×3 IMPLANT
VENT LEFT HEART 12002 (CATHETERS) ×3
WATER STERILE IRR 1000ML POUR (IV SOLUTION) ×6 IMPLANT

## 2018-11-19 NOTE — Anesthesia Procedure Notes (Signed)
Arterial Line Insertion Start/End11/21/2019 7:00 AM Performed by: Lanell MatarBaker, Kathryn M, CRNA, CRNA  Patient location: Pre-op. Patient sedated Left, radial was placed Catheter size: 20 G Maximum sterile barriers used   Attempts: 2 Procedure performed without using ultrasound guided technique. Following insertion, dressing applied. Post procedure assessment: normal  Patient tolerated the procedure well with no immediate complications.

## 2018-11-19 NOTE — Interval H&P Note (Signed)
History and Physical Interval Note:  11/19/2018 5:51 AM  Jonathan Holland  has presented today for surgery, with the diagnosis of aortic stenosis coronary artery disease  The various methods of treatment have been discussed with the patient and family. After consideration of risks, benefits and other options for treatment, the patient has consented to  Procedure(s): AORTIC VALVE REPLACEMENT (AVR)CABG,TEE (N/A) CORONARY ARTERY BYPASS GRAFTING (CABG) (N/A) TRANSESOPHAGEAL ECHOCARDIOGRAM (TEE) (N/A) as a surgical intervention .  The patient's history has been reviewed, patient examined, no change in status, stable for surgery.  I have reviewed the patient's chart and labs.  Questions were answered to the patient's satisfaction.     Purcell Nailslarence H Marabelle Cushman

## 2018-11-19 NOTE — Anesthesia Postprocedure Evaluation (Signed)
Anesthesia Post Note  Patient: Pamala HurryJohnny W Hauser  Procedure(s) Performed: AORTIC VALVE REPLACEMENT (AVR) using a 21mm Edwards Inspiris Aortic Valve (N/A Chest) CORONARY ARTERY BYPASS GRAFTING (CABG) x1 using the left internal mammary artery (N/A Chest) TRANSESOPHAGEAL ECHOCARDIOGRAM (TEE) (N/A )     Patient location during evaluation: SICU Anesthesia Type: General Level of consciousness: sedated Pain management: pain level controlled Vital Signs Assessment: post-procedure vital signs reviewed and stable Respiratory status: patient remains intubated per anesthesia plan Cardiovascular status: stable Postop Assessment: no apparent nausea or vomiting Anesthetic complications: no    Last Vitals:  Vitals:   11/19/18 0551 11/19/18 1311  BP: (!) 146/58 93/70  Pulse:  80  Resp:  14  Temp:    SpO2:  100%    Last Pain:  Vitals:   11/19/18 0607  TempSrc:   PainSc: 8                  Kennieth RadFitzgerald, Jehiel Koepp E

## 2018-11-19 NOTE — Progress Notes (Signed)
  Echocardiogram Echocardiogram Transesophageal has been performed.  Celene SkeenVijay  Manual Navarra 11/19/2018, 8:58 AM

## 2018-11-19 NOTE — Anesthesia Procedure Notes (Signed)
Procedure Name: Intubation Date/Time: 11/19/2018 8:33 AM Performed by: Modena MorrowGreenwood, Deniece Rankin S, CRNA Pre-anesthesia Checklist: Patient identified, Emergency Drugs available, Suction available, Patient being monitored and Timeout performed Patient Re-evaluated:Patient Re-evaluated prior to induction Oxygen Delivery Method: Circle system utilized Preoxygenation: Pre-oxygenation with 100% oxygen Induction Type: IV induction Ventilation: Mask ventilation without difficulty and Oral airway inserted - appropriate to patient size Laryngoscope Size: Hyacinth MeekerMiller and 2 Grade View: Grade I Tube type: Oral Tube size: 8.5 mm Number of attempts: 1 Airway Equipment and Method: Stylet Placement Confirmation: ETT inserted through vocal cords under direct vision,  positive ETCO2 and breath sounds checked- equal and bilateral Secured at: 24 cm Tube secured with: Tape Dental Injury: Teeth and Oropharynx as per pre-operative assessment

## 2018-11-19 NOTE — Brief Op Note (Signed)
11/19/2018  11:44 AM  PATIENT:  Jonathan Holland  72 y.o. male  PRE-OPERATIVE DIAGNOSIS:  aortic stenosis coronary artery disease  POST-OPERATIVE DIAGNOSIS:  aortic stenosis coronary artery disease  PROCEDURE:  Procedure(s): AORTIC VALVE REPLACEMENT (AVR) using a 21mm Edwards Inspiris Aortic Valve (N/A) CORONARY ARTERY BYPASS GRAFTING (CABG) x1 using the left internal mammary artery (N/A) TRANSESOPHAGEAL ECHOCARDIOGRAM (TEE) (N/A)  SURGEON:  Surgeon(s) and Role:    Purcell Nails* Owen, Clarence H, MD - Primary  PHYSICIAN ASSISTANT: WAYNE GOLD PA-C  ANESTHESIA:   general  EBL: 800 mL  BLOOD ADMINISTERED:2 units CC PRBC  DRAINS: PLEURAL AND PERICARDIAL CHEST TUBES   LOCAL MEDICATIONS USED:  NONE  SPECIMEN:  Source of Specimen:  AORTIC VALVE LEAFLETS  DISPOSITION OF SPECIMEN:  PATHOLOGY  COUNTS:  YES  TOURNIQUET:  * No tourniquets in log *  DICTATION: .Dragon Dictation  PLAN OF CARE: Admit to inpatient   PATIENT DISPOSITION:  ICU - intubated and hemodynamically stable.   Delay start of Pharmacological VTE agent (>24hrs) due to surgical blood loss or risk of bleeding: yes

## 2018-11-19 NOTE — Procedures (Signed)
Extubation Procedure Note  Patient Details:   Name: Pamala HurryJohnny W Lamere DOB: 1946-11-16 MRN: [id number]   Airway Documentation:    Vent end date: 11/19/18 Vent end time: 1648   Evaluation  O2 sats: stable throughout Complications: No apparent complications Patient did tolerate procedure well. Bilateral Breath Sounds: Diminished   Yes  Peggye FormSteven  Depaul Arizpe 11/19/2018, 4:49 PM

## 2018-11-19 NOTE — Op Note (Signed)
CARDIOTHORACIC SURGERY OPERATIVE NOTE  Date of Procedure:  11/19/2018  Preoperative Diagnosis:   Severe Aortic Stenosis  Severe Multi-vessel Coronary Artery Disease  Postoperative Diagnosis: Same  Procedure:    Aortic Valve Replacement  Edwards Inspiris Resilia Stented Bovine Pericardial Tissue Valve (size 21mm, ref # [id number], serial # [id number])   Coronary Artery Bypass Grafting x 1  Left Internal Mammary Artery to Distal Left Anterior Descending Coronary Artery  Surgeon: Salvatore Decentlarence H. Cornelius Moraswen, MD  Assistant: Rowe ClackWayne E. Gold, PA-C  Anesthesia: Marcene Duosobert Fitzgerald, MD  Operative Findings:  Severe aortic stenosis  Normal left ventricular systolic function  Good quality left internal mammary artery conduit  Good quality target vessel for grafting      BRIEF CLINICAL NOTE AND INDICATIONS FOR SURGERY  Patient is a 72 year old male with aortic stenosis, hypertension, hyperlipidemia, long-standing tobacco abuse with likely COPD, and chronic anemia with presumed history of recurrent GI bleeding caused by AV malformations who has been referred for surgical consultation to discuss treatment options for management of severe aortic stenosis and multivessel coronary artery disease.  Patient states he has known of presence of a heart murmur for several years. He gets most of his medical care at [facility] in [place]. He has history of chronic anemia with multiple previous visits to the emergency room for generalized weakness associated with symptomatic anemia. He has never had any clinical GI bleeding episodes, but intermittently in the past occult testing of the stool has demonstrated the presence of Hemoccult blood. He reportedly has undergone upper GI endoscopy and colonoscopy in the distant past, although the patient states that he has not had endoscopy for several years because he is "allergic" to the oral cathartics utilized for colonoscopy. He states that his primary  care physician at the [facility] has told him that he likely has AV malformations in his intestines. The patient reports that over the last several months he has had rapid progression of symptoms of exertional shortness of breath and generalized pain across his chest with profound decreased energy and generalized weakness. He states that he has gotten to the point where he really cannot do any sort of sustained physical activity without getting short of breath or tired. He states that symptoms of shortness of breath and chest discomfort always relieved within a few minutes of rest. He has not had any rest pain or shortness of breath. He denies any history of PND, orthopnea, dizzy spells, or syncope. However, he states that when his pain and shortness of breath gets severe he feels as though he is going to pass out. He has developed some mild lower extremity edema.  The patient was seen in follow-up recently by his primary care physician at the [facility] and underwent an echocardiogram that revealed severe aortic stenosis. [facility] where he underwent echocardiogram and diagnostic cardiac catheterization on October 30, 2018. By report transthoracic echocardiogram demonstrated normal left ventricular size with mild left ventricular systolic dysfunction. There was reportedly hypokinesis or akinesis in the distal anterior wall and apex. There was severe aortic stenosis reported with peak velocity across the aortic valve measured 4.3 m/s corresponding to mean transvalvular gradient estimated 46 mmHg. The aortic valve area was calculated 0.64 cm. There was normal right ventricular size and function, normal right atrial size, normal left atrial size, and no other significant valvular pathology. Diagnostic cardiac catheterization was performed and reported to demonstrate multivessel coronary artery disease with high-grade  90% stenosis of  the mid left anterior descending coronary artery. There is 50% stenosis in the mid right coronary artery and 30 to 50% stenosis in the left circumflex coronary artery. Pulmonary artery pressures were mildly elevated. Further work-up for possible surgical aortic valve replacement with coronary artery bypass grafting versus transcatheter aortic valve replacement and PCI was recommended. The patient was unhappy with the care he received in [place] and elected to defer work-up at that time. He was self-referred to our office for a second opinion.  The patient has been seen in consultation and counseled at length regarding the indications, risks and potential benefits of surgery.  All questions have been answered, and the patient provides full informed consent for the operation as described.     DETAILS OF THE OPERATIVE PROCEDURE  Preparation:  The patient is brought to the operating room on the above mentioned date and central monitoring was established by the anesthesia team including placement of Swan-Ganz catheter and radial arterial line. The patient is placed in the supine position on the operating table.  Intravenous antibiotics are administered. General endotracheal anesthesia is induced uneventfully. A Foley catheter is placed.  Baseline transesophageal echocardiogram was performed.  Findings were notable for severe aortic stenosis with normal left ventricular systolic function.  There was mild central mitral regurgitation.  The patient's chest, abdomen, both groins, and both lower extremities are prepared and draped in a sterile manner. A time out procedure is performed.   Surgical Approach and Conduit Harvest:  A median sternotomy incision was performed and the left internal mammary artery is dissected from the chest wall and prepared for bypass grafting. The left internal mammary artery is notably good quality conduit. Following systemic heparinization, the left internal  mammary artery was transected distally noted to have excellent flow.   Extracorporeal Cardiopulmonary Bypass and Myocardial Protection:  The pericardium is opened. The ascending aorta is normal in appearance. The ascending aorta and the right atrium are cannulated for cardiopulmonary bypass.  Adequate heparinization is verified.    A retrograde cardioplegia cannula is placed through the right atrium into the coronary sinus.  The operative field was continuously flooded with carbon dioxide gas.  The entire pre-bypass portion of the operation was notable for stable hemodynamics.  Cardiopulmonary bypass was begun and a left ventricular vent placed through the right superior pulmonary vein.  The surface of the heart inspected. Distal target vessels are selected for coronary artery bypass grafting. A cardioplegia cannula is placed in the ascending aorta.  A temperature probe was placed in the interventricular septum.  The patient is cooled to 32C systemic temperature.  The aortic cross clamp is applied and cardioplegia is delivered initially in an antegrade fashion through the aortic root using modified [place] cold blood cardioplegia (Kennestone blood cardioplegia protocol).   The initial cardioplegic arrest is rapid with early diastolic arrest.  Myocardial protection was felt to be excellent.   Coronary Artery Bypass Grafting:  The distal left anterior coronary artery was grafted with the left internal mammary artery in an end-to-side fashion.  At the site of distal anastomosis the target vessel was good quality and measured approximately 1.8 mm in diameter.   Aortic Valve Replacement:  An oblique transverse aortotomy incision was performed.  The aortic valve was inspected and notable for a trileaflet aortic valve with severe aortic stenosis.  The aortic valve leaflets were excised sharply and the aortic annulus decalcified.  Decalcification was notably straightforward.  The aortic annulus was  sized  to accept a 21 mm prosthesis.  The aortic root and left ventricle were irrigated with copious cold saline solution.  Aortic valve replacement was performed using interrupted horizontal mattress 2-0 Ethibond pledgeted sutures with pledgets in the subannular position.  An Edwards Inspiris Resilia stented bovine pericardial tissue valve (size 21 mm, ref # [id number], serial # [id number]) was implanted uneventfully. The valve seated appropriately with adequate space beneath the left main and right coronary artery.  The aortotomy was closed using a 2-layer closure of running 4-0 Prolene suture.   Procedure Completion:  All proximal vein graft anastomoses were placed directly to the ascending aorta prior to removal of the aortic cross clamp.  The septal myocardial temperature rose rapidly after reperfusion of the left internal mammary artery graft.  One final dose of warm retrograde "reanimation dose" cardioplegia was administered through the coronary sinus catheter while all air was evacuated through the aortic root.  The aortic cross clamp was removed after a total cross clamp time of 77 minutes.  All proximal and distal coronary anastomoses were inspected for hemostasis and appropriate graft orientation. Epicardial pacing wires are fixed to the inferior right ventricular freewall and to the right atrial appendage. The patient is rewarmed to 37C temperature. The aortic and left ventricular vents were removed.  The patient is weaned and disconnected from cardiopulmonary bypass.  The patient's rhythm at separation from bypass was AV paced.  The patient was weaned from cardiopulmonary bypass without any inotropic support. Total cardiopulmonary bypass time for the operation was 101 minutes.  Followup transesophageal echocardiogram performed after separation from bypass revealed a well-seated bioprosthetic tissue valve in the aortic position that was functioning normally.  There was no perivalvular leak.  There  were otherwise no changes from the preoperative exam.  Mean transvalvular gradient across the aortic valve was measured 11 mmHg.  The aortic and venous cannula were removed uneventfully. Protamine was administered to reverse the anticoagulation. The mediastinum and pleural space were inspected for hemostasis and irrigated with saline solution. The mediastinum and the left pleural space were drained using 3 chest tubes placed through separate stab incisions inferiorly.  The soft tissues anterior to the aorta were reapproximated loosely. The sternum is closed with double strength sternal wire. The soft tissues anterior to the sternum were closed in multiple layers and the skin is closed with a running subcuticular skin closure.  The post-bypass portion of the operation was notable for stable rhythm and hemodynamics.  No blood products were administered during the operation.   Disposition:  The patient tolerated the procedure well and is transported to the surgical intensive care in stable condition. There are no intraoperative complications. All sponge instrument and needle counts are verified correct at completion of the operation.   Salvatore Decent. Cornelius Moras MD 11/19/2018 12:46 PM

## 2018-11-19 NOTE — Progress Notes (Signed)
Patient ID: Jonathan Holland, male   DOB: 05-Feb-1946, 72 y.o.   MRN: [id number]  TCTS Evening Rounds:   Hemodynamically stable  CI = 1.9  Extubated.  Urine output good  CT output 150, 100 cc last two hrs.  Transfused 2 units PRBC's for postop anemia.  CBC    Component Value Date/Time   WBC 7.0 11/19/2018 1307   RBC 3.34 (L) 11/19/2018 1307   HGB 9.3 (L) 11/19/2018 1307   HCT 30.7 (L) 11/19/2018 1307   PLT 96 (L) 11/19/2018 1307   MCV 91.9 11/19/2018 1307   MCH 27.8 11/19/2018 1307   MCHC 30.3 11/19/2018 1307   RDW 14.7 11/19/2018 1307   LYMPHSABS 0.6 (L) 07/29/2018 0739   MONOABS 0.4 07/29/2018 0739   EOSABS 0.0 07/29/2018 0739   BASOSABS 0.0 07/29/2018 0739     BMET    Component Value Date/Time   NA 141 11/19/2018 1159   K 4.0 11/19/2018 1159   CL 103 11/19/2018 1159   CO2 25 11/16/2018 0838   GLUCOSE 128 (H) 11/19/2018 1159   BUN <3 (L) 11/19/2018 1159   CREATININE 0.90 11/19/2018 1159   CALCIUM 8.8 (L) 11/16/2018 0838   GFRNONAA >60 11/16/2018 0838   GFRAA >60 11/16/2018 [id number]     A/P:  Stable postop course. Continue current plans. Monitor chest tube output.

## 2018-11-19 NOTE — Anesthesia Procedure Notes (Signed)
Central Venous Catheter Insertion Performed by: Kipp BroodJoslin, Dontrey Snellgrove, MD, anesthesiologist Start/End11/21/2019 6:30 AM, 11/19/2018 6:40 AM Patient location: Pre-op. Preanesthetic checklist: patient identified, IV checked, site marked, risks and benefits discussed, surgical consent, monitors and equipment checked, pre-op evaluation, timeout performed and anesthesia consent Position: supine Hand hygiene performed  and maximum sterile barriers used  PA cath was placed.Swan type:thermodilution Procedure performed without using ultrasound guided technique. Attempts: 1 Following insertion, line sutured, dressing applied and Biopatch. Post procedure assessment: blood return through all ports and free fluid flow  Patient tolerated the procedure well with no immediate complications.

## 2018-11-19 NOTE — Progress Notes (Signed)
Spoke with a Pharmacist, Nyra CapesJames Ledford,stated okay  to give peridex oral rinse prior to surgery, even though cross- sensitivity showed up in epic. Stated was very low and pt. Should have no problems.

## 2018-11-19 NOTE — Anesthesia Procedure Notes (Signed)
Central Venous Catheter Insertion Performed by: Kipp BroodJoslin, Yishai Rehfeld, MD, anesthesiologist Start/End11/21/2019 6:40 AM, 11/19/2018 6:50 AM Patient location: Pre-op. Preanesthetic checklist: patient identified, IV checked, site marked, risks and benefits discussed, surgical consent, monitors and equipment checked, pre-op evaluation, timeout performed and anesthesia consent Hand hygiene performed  and maximum sterile barriers used  Catheter size: 9 Fr PA cath was placed.Swan type:thermodilution Procedure performed without using ultrasound guided technique. Ultrasound Notes:anatomy identified, needle tip was noted to be adjacent to the nerve/plexus identified and no ultrasound evidence of intravascular and/or intraneural injection Attempts: 1 Following insertion, line sutured, dressing applied and Biopatch. Post procedure assessment: blood return through all ports, free fluid flow and no air  Patient tolerated the procedure well with no immediate complications.

## 2018-11-19 NOTE — Transfer of Care (Signed)
Immediate Anesthesia Transfer of Care Note  Patient: Jonathan Holland  Procedure(s) Performed: AORTIC VALVE REPLACEMENT (AVR) using a 21mm Edwards Inspiris Aortic Valve (N/A Chest) CORONARY ARTERY BYPASS GRAFTING (CABG) x1 using the left internal mammary artery (N/A Chest) TRANSESOPHAGEAL ECHOCARDIOGRAM (TEE) (N/A )  Patient Location: SICU  Anesthesia Type:General  Level of Consciousness: sedated  Airway & Oxygen Therapy: Patient remains intubated per anesthesia plan  Post-op Assessment: Report given to RN and Post -op Vital signs reviewed and stable  Post vital signs: Reviewed and stable  Last Vitals:  Vitals Value Taken Time  BP    Temp    Pulse 80 11/19/2018  1:08 PM  Resp 13 11/19/2018  1:08 PM  SpO2 100 % 11/19/2018  1:08 PM  Vitals shown include unvalidated device data.  Last Pain:  Vitals:   11/19/18 0607  TempSrc:   PainSc: 8          Complications: No apparent anesthesia complications

## 2018-11-20 ENCOUNTER — Inpatient Hospital Stay (HOSPITAL_COMMUNITY): Payer: No Typology Code available for payment source

## 2018-11-20 ENCOUNTER — Encounter (HOSPITAL_COMMUNITY): Payer: Self-pay | Admitting: Thoracic Surgery (Cardiothoracic Vascular Surgery)

## 2018-11-20 LAB — BASIC METABOLIC PANEL
Anion gap: 5 (ref 5–15)
BUN: 9 mg/dL (ref 8–23)
CALCIUM: 7.8 mg/dL — AB (ref 8.9–10.3)
CO2: 24 mmol/L (ref 22–32)
CREATININE: 1.02 mg/dL (ref 0.61–1.24)
Chloride: 110 mmol/L (ref 98–111)
GLUCOSE: 108 mg/dL — AB (ref 70–99)
Potassium: 4.2 mmol/L (ref 3.5–5.1)
Sodium: 139 mmol/L (ref 135–145)

## 2018-11-20 LAB — PREPARE PLATELET PHERESIS
UNIT DIVISION: 0
Unit division: 0
Unit division: 0

## 2018-11-20 LAB — CBC
HCT: 28.9 % — ABNORMAL LOW (ref 39.0–52.0)
HCT: 32.6 % — ABNORMAL LOW (ref 39.0–52.0)
HEMOGLOBIN: 10.1 g/dL — AB (ref 13.0–17.0)
Hemoglobin: 9.3 g/dL — ABNORMAL LOW (ref 13.0–17.0)
MCH: 29.2 pg (ref 26.0–34.0)
MCH: 29 pg (ref 26.0–34.0)
MCHC: 32.2 g/dL (ref 30.0–36.0)
MCHC: 31 g/dL (ref 30.0–36.0)
MCV: 90.9 fL (ref 80.0–100.0)
MCV: 93.7 fL (ref 80.0–100.0)
NRBC: 0 % (ref 0.0–0.2)
PLATELETS: 121 10*3/uL — AB (ref 150–400)
Platelets: 127 10*3/uL — ABNORMAL LOW (ref 150–400)
RBC: 3.18 MIL/uL — ABNORMAL LOW (ref 4.22–5.81)
RBC: 3.48 MIL/uL — ABNORMAL LOW (ref 4.22–5.81)
RDW: 14.6 % (ref 11.5–15.5)
RDW: 14.8 % (ref 11.5–15.5)
WBC: 7 10*3/uL (ref 4.0–10.5)
WBC: 9.9 10*3/uL (ref 4.0–10.5)
nRBC: 0 % (ref 0.0–0.2)

## 2018-11-20 LAB — POCT I-STAT, CHEM 8
BUN: 12 mg/dL (ref 8–23)
CALCIUM ION: 1.16 mmol/L (ref 1.15–1.40)
CREATININE: 1.1 mg/dL (ref 0.61–1.24)
Chloride: 102 mmol/L (ref 98–111)
GLUCOSE: 119 mg/dL — AB (ref 70–99)
HCT: 30 % — ABNORMAL LOW (ref 39.0–52.0)
Hemoglobin: 10.2 g/dL — ABNORMAL LOW (ref 13.0–17.0)
Potassium: 4.7 mmol/L (ref 3.5–5.1)
SODIUM: 137 mmol/L (ref 135–145)
TCO2: 27 mmol/L (ref 22–32)

## 2018-11-20 LAB — BPAM FFP
BLOOD PRODUCT EXPIRATION DATE: 201911232359
Blood Product Expiration Date: 201911232359
ISSUE DATE / TIME: 201911211821
ISSUE DATE / TIME: 201911211821
UNIT TYPE AND RH: 6200
Unit Type and Rh: 6200

## 2018-11-20 LAB — PREPARE FRESH FROZEN PLASMA: UNIT DIVISION: 0

## 2018-11-20 LAB — BPAM PLATELET PHERESIS
BLOOD PRODUCT EXPIRATION DATE: 201911232359
Blood Product Expiration Date: 201911222359
Blood Product Expiration Date: 201911232359
ISSUE DATE / TIME: 201911211821
ISSUE DATE / TIME: 201911220153
ISSUE DATE / TIME: 201911211821
UNIT TYPE AND RH: 5100
Unit Type and Rh: 5100
Unit Type and Rh: 5100

## 2018-11-20 LAB — CREATININE, SERUM
CREATININE: 1.17 mg/dL (ref 0.61–1.24)
GFR calc non Af Amer: >60 mL/min (ref >60)

## 2018-11-20 LAB — MAGNESIUM
MAGNESIUM: 2.4 mg/dL (ref 1.7–2.4)
Magnesium: 2.6 mg/dL — ABNORMAL HIGH (ref 1.7–2.4)

## 2018-11-20 LAB — GLUCOSE, CAPILLARY
GLUCOSE-CAPILLARY: 115 mg/dL — AB (ref 70–99)
Glucose-Capillary: 111 mg/dL — ABNORMAL HIGH (ref 70–99)
Glucose-Capillary: 142 mg/dL — ABNORMAL HIGH (ref 70–99)
Glucose-Capillary: 106 mg/dL — ABNORMAL HIGH (ref 70–99)
Glucose-Capillary: 114 mg/dL — ABNORMAL HIGH (ref 70–99)
Glucose-Capillary: 90 mg/dL (ref 70–99)
Glucose-Capillary: 117 mg/dL — ABNORMAL HIGH (ref 70–99)
Glucose-Capillary: 122 mg/dL — ABNORMAL HIGH (ref 70–99)

## 2018-11-20 MED ORDER — FUROSEMIDE 20 MG PO TABS
20.0000 mg | ORAL_TABLET | Freq: Every day | ORAL | Status: DC
  Administered 2018-11-20: 20 mg via ORAL
  Filled 2018-11-20: qty 1

## 2018-11-20 MED ORDER — FENTANYL 25 MCG/HR TD PT72
50.0000 ug | MEDICATED_PATCH | TRANSDERMAL | Status: DC
  Administered 2018-11-20: 50 ug via TRANSDERMAL
  Filled 2018-11-20: qty 2

## 2018-11-20 MED ORDER — ORAL CARE MOUTH RINSE
15.0000 mL | Freq: Two times a day (BID) | OROMUCOSAL | Status: DC
  Administered 2018-11-23: 15 mL via OROMUCOSAL

## 2018-11-20 MED ORDER — ENOXAPARIN SODIUM 40 MG/0.4ML ~~LOC~~ SOLN
40.0000 mg | Freq: Every day | SUBCUTANEOUS | Status: DC
  Administered 2018-11-20: 40 mg via SUBCUTANEOUS
  Filled 2018-11-20: qty 0.4

## 2018-11-20 MED ORDER — POTASSIUM CHLORIDE CRYS ER 10 MEQ PO TBCR
10.0000 meq | EXTENDED_RELEASE_TABLET | Freq: Every day | ORAL | Status: DC
  Administered 2018-11-20 – 2018-11-21 (×2): 10 meq via ORAL
  Filled 2018-11-20 (×2): qty 1

## 2018-11-20 MED ORDER — INSULIN ASPART 100 UNIT/ML ~~LOC~~ SOLN: 0.0000 [IU] | SUBCUTANEOUS | Status: DC

## 2018-11-20 MED ORDER — FUROSEMIDE 10 MG/ML IJ SOLN
20.0000 mg | Freq: Two times a day (BID) | INTRAMUSCULAR | Status: DC
  Administered 2018-11-20 – 2018-11-21 (×2): 20 mg via INTRAVENOUS
  Filled 2018-11-20 (×2): qty 2

## 2018-11-20 MED FILL — Lidocaine HCl(Cardiac) IV PF Soln Pref Syr 100 MG/5ML (2%): INTRAVENOUS | Qty: 25 | Status: AC

## 2018-11-20 MED FILL — Potassium Chloride Inj 2 mEq/ML: INTRAVENOUS | Qty: 40 | Status: AC

## 2018-11-20 MED FILL — Dexmedetomidine HCl in NaCl 0.9% IV Soln 400 MCG/100ML: INTRAVENOUS | Qty: 100 | Status: AC

## 2018-11-20 MED FILL — Sodium Bicarbonate IV Soln 8.4%: INTRAVENOUS | Qty: 50 | Status: AC

## 2018-11-20 MED FILL — Magnesium Sulfate Inj 50%: INTRAMUSCULAR | Qty: 10 | Status: AC

## 2018-11-20 MED FILL — Electrolyte-R (PH 7.4) Solution: INTRAVENOUS | Qty: 3000 | Status: AC

## 2018-11-20 MED FILL — Heparin Sodium (Porcine) Inj 1000 Unit/ML: INTRAMUSCULAR | Qty: 30 | Status: AC

## 2018-11-20 MED FILL — Sodium Chloride IV Soln 0.9%: INTRAVENOUS | Qty: 2000 | Status: AC

## 2018-11-20 MED FILL — Mannitol IV Soln 20%: INTRAVENOUS | Qty: 1000 | Status: AC

## 2018-11-20 MED FILL — Heparin Sodium (Porcine) Inj 1000 Unit/ML: INTRAMUSCULAR | Qty: 10 | Status: AC

## 2018-11-20 NOTE — Discharge Summary (Signed)
Physician Discharge Summary  Patient ID: Jonathan Holland MRN: [id number] DOB/AGE: January 23, 1946 72 y.o.  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 S/P CABG x 1 11/19/2018  . 3-vessel CAD 11/18/2018  . B12 deficiency 10/30/2018  . Coronary artery disease involving native coronary artery of native heart 10/30/2018  . Aortic stenosis, severe 10/28/2018  . Atherosclerosis of aorta (HCC) 03/26/2016  . DOE (dyspnea on exertion) 03/26/2016  . Tobacco abuse 03/26/2016  . Heart murmur 03/26/2016  . Hyperlipidemia with target LDL less than 70 03/26/2016  . Anemia 03/19/2016  . Symptomatic anemia 03/19/2016  . 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  Patient states he has known of presence of a heart murmur for several years. He gets most of his medical care at the [facility] in [place]. He has history of chronic anemia with multiple previous visits to the emergency room for generalized weakness associated with symptomatic anemia. He has never had any clinical GI bleeding episodes, but  intermittently in the past occult testing of the stool has demonstrated the presence of Hemoccult blood. He reportedly has undergone upper GI endoscopy and colonoscopy in the distant past, although the patient states that he has not had endoscopy for several years because he is "allergic" to the oral cathartics utilized for colonoscopy. 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 The patient reports that over the last several months he has had rapid progression of symptoms of exertional shortness of breath and generalized pain across his chest with profound decreased energy and generalized weakness. He states that he has gotten to the point where he really cannot do any sort of sustained physical activity without getting short of breath or tired. He states that symptoms of shortness of breath and chest discomfort always relieved within a few minutes of rest. He has not had any rest pain or shortness of breath. He denies any history of PND, orthopnea, dizzy spells, or syncope. However, he states that when his pain and shortness of breath gets severe he feels as though he is going to pass out. He has developed some mild lower extremity edema.  The patient was seen in follow-up recently by his primary care physician at the [facility] and underwent an echocardiogram that revealed severe aortic stenosis. He was referred [facility] where he underwent echocardiogram and diagnostic cardiac catheterization on October 30, 2018. By report transthoracic echocardiogram demonstrated normal left ventricular size with mild left ventricular systolic dysfunction. There was reportedly hypokinesis or akinesis in the distal anterior wall  and apex. There was severe aortic stenosis reported with peak velocity across the aortic valve measured 4.3 m/s corresponding to mean transvalvular  gradient estimated 46 mmHg. The aortic valve area was calculated 0.64 cm. There was normal right ventricular size and function, normal right atrial size, normal left atrial size, and no other significant valvular pathology. Diagnostic cardiac catheterization was performed and reported to demonstrate multivessel coronary artery disease with high-grade 90% stenosis of the mid left anterior descending coronary artery. There is 50% stenosis in the mid right coronary artery and 30 to 50% stenosis in the left circumflex coronary artery. Pulmonary artery pressures were mildly elevated. Further work-up for possible surgical aortic valve replacement with coronary artery bypass grafting versus transcatheter aortic valve replacement and PCI was recommended. The patient was unhappy with the care he received in [place] and elected to defer work-up at that time. He was self-referred to our office today for a second opinion.  The patient is single [place].He has been widowed for 9 years and retired for longer than that. He remains active, enjoying woodcarving.He drives a mobile and does all ordinary chores around his home. He states that he has not been limited physically until recently. He does not exercise on a regular basis, but he has not had any significant restrictions until the last several months. He has a long history of tobacco abuse although he quit smoking in September as his problems with shortness of breath increased. He has 1 adult daughter who lives in [place] and is scheduled to travel home to [place] for a visit next week.  Patient returns to the office today for follow-up with tentative plans to proceed with aortic valve replacement and coronary artery bypass grafting tomorrow. He was originally seen in consultation last week on November 12, 2018. Since then he underwent CT angiography, echocardiogram, and routine preoperative blood work with anemia  panel. He reports no new problems or complaints. He is looking forward to proceeding with surgery as planned.  He was admitted this hospitalization for the procedure.  Discharged Condition: good  Hospital Course: Patient was admitted electively and on 07/19/2018 taken the operating room where he underwent the below described procedure.  He tolerated well was taken to the surgical intensive care unit in stable condition.  Postoperative hospital course:  The patient is doing quite well.  He has maintained stable hemodynamics.  He has not required any inotropic support.  He did have some intermittent postoperative atrial fibrillation and was started on amiodarone.  An echocardiogram was done which was reviewed by Dr. Cornelius Moras in the postoperative period and felt to be stable.Marland Kitchen  He did have some difficulty with pain management outpatient and he is a known chronic pain patient with contract with a clinic.  This is primarily for back and neck pain.  The pain was stabilized over time initially was a fentanyl patch but was transitioned to PCA which was discontinued over time.  Patient also requested follow-up with cardiology hearing [facility] and this has been arranged.  He is not felt to be a candidate for systemic anticoagulation as he has not some known history of GI bleeding due to AV malformations.  He does have an expected acute blood loss anemia which is stable.  Most recent hemoglobin hematocrit dated 11/22/2018 are 9.2 and 29.1 respectively.  He has a mild thrombocytopenia.  Renal function is noted to be within normal limits and he has had a good operative diuresis.  The patient is tolerating  gradually increasing activities using standard cardiac rehab modalities.  Visions are noted to be healing well without evidence of infection.  Is tolerating diet.  Surgeon has been weaned and he maintains good saturations on room air.  At time of discharge the patient is felt to be quite stable.  Consults:  None  Significant Diagnostic Studies: Routine postoperative labs and serial chest x-rays.  Treatments: surgery:  CARDIOTHORACIC SURGERY OPERATIVE NOTE  Date of Procedure:                11/19/2018  Preoperative Diagnosis:       ? Severe Aortic Stenosis ? Severe Multi-vessel Coronary Artery Disease  Postoperative Diagnosis:    Same  Procedure:        Aortic Valve Replacement             Edwards Inspiris Resilia Stented Bovine Pericardial Tissue Valve (size 21mm, ref # [id number], serial # [id number])   Coronary Artery Bypass Grafting x 1             Left Internal Mammary Artery to Distal Left Anterior Descending Coronary Artery  Surgeon:        Salvatore Decent. Cornelius Moras, MD  Assistant:       Rowe Clack, PA-C  Anesthesia:    Marcene Duos, MD  Operative Findings: ? Severe aortic stenosis ? Normal left ventricular systolic function ? Good quality left internal mammary artery conduit ? Good quality target vessel for grafting Discharge Exam: Blood pressure 116/65, pulse 77, temperature (!) 97.5 F (36.4 C), temperature source Oral, resp. rate 18, height 5\' 10"  (1.778 m), weight 67 kg, SpO2 97 %.    General appearance: alert, cooperative and no distress Heart: regular rate and rhythm and soft systolic murmur Lungs: clear to auscultation bilaterally Abdomen: benign Extremities: no edema Wound: incis healing well  Disposition: Discharge disposition: 01-Home or Self Care       Discharge Instructions    Discharge patient   Complete by:  As directed    Discharge disposition:  01-Home or Self Care   Discharge patient date:  11/24/2018     Allergies as of 11/24/2018      Reactions   Penicillins Anaphylaxis   Has patient had a PCN reaction causing immediate rash, facial/tongue/throat swelling, SOB or lightheadedness with hypotension: Yes Has patient had a PCN reaction causing severe rash involving mucus membranes or skin necrosis: Yes Has patient had a PCN  reaction that required hospitalization: Yes Has patient had a PCN reaction occurring within the last 10 years: No If all of the above answers are "NO", then may proceed with Cephalosporin use.   Other Other (See Comments)   Medication used prior to colonoscopy- Pt states it feels like "gasoline was poured on him", throat swelling   Golytely [peg 3350-electrolytes] Rash      Medication List    TAKE these medications   albuterol 108 (90 Base) MCG/ACT inhaler Commonly known as:  PROVENTIL HFA;VENTOLIN HFA Inhale 2 puffs into the lungs every 4 (four) hours as needed for wheezing or shortness of breath.   amiodarone 200 MG tablet Commonly known as:  PACERONE Take 1 tablet (200 mg total) by mouth 2 (two) times daily.   aspirin EC 81 MG tablet Take 81 mg by mouth daily.   atorvastatin 80 MG tablet Commonly known as:  LIPITOR Take 1 tablet (80 mg total) by mouth daily at 6 PM. What changed:    medication strength  how much to take  when  to take this   ferrous sulfate 325 (65 FE) MG tablet Take 325 mg by mouth See admin instructions. Twice daily with meals on Monday, Wednesday, Friday   metoprolol tartrate 50 MG tablet Commonly known as:  LOPRESSOR Take 1 tablet (50 mg total) by mouth 2 (two) times daily.   multivitamin tablet Take 1 tablet by mouth daily.   oxyCODONE 5 MG immediate release tablet Commonly known as:  Oxy IR/ROXICODONE Take 1-2 tablets (5-10 mg total) by mouth every 6 (six) hours as needed for up to 7 days for severe pain. What changed:    medication strength  how much to take  when to take this  reasons to take this   pantoprazole 40 MG tablet Commonly known as:  PROTONIX Take 40 mg by mouth daily as needed (heartburn).   tamsulosin 0.4 MG Caps capsule Commonly known as:  FLOMAX Take 0.4 mg by mouth daily.   vitamin B-12 1000 MCG tablet Commonly known as:  CYANOCOBALAMIN Take 1,000 mcg by mouth daily.   vitamin C 500 MG tablet Commonly  known as:  ASCORBIC ACID Take 500 mg by mouth daily.      Follow-up Information    Purcell Nails, MD Follow up.   Specialty:  Cardiothoracic Surgery Why:  Please see discharge paperwork for follow-up appointment with the surgeon.  Is obtain a chest x-ray at [facility] imaging 1/2-hour prior to the appointment.  [place] imaging is located in the same office complex on the first floor. Contact information: [address] [place] [address] [phone]        Tereso Newcomer T, PA-C Follow up.   Specialties:  Cardiology, Physician Assistant Why:  [facility] - 12/09/18 at 11:15am. Arrive 15 minutes prior to appointment to check in. Lorin Picket is one of the PAs that works closely with our cardiology team. Contact information: [address] [place] [address] [phone]          The patient has been discharged on:   1.Beta Blocker:  Yes [ y  ]                              No   [   ]                              If No, reason:  2.Ace Inhibitor/ARB: Yes [   ]                                     No  [ n   ]                                     If No, reason: Soft blood pressure  3.Statin:   Yes [ y  ]                  No  [   ]                  If No, reason:  4.Ecasa:  Yes  [ y  ]                  No   [   ]  If No, reason:  Signed: Rowe Clack PA-C 11/24/2018, 12:57 PM

## 2018-11-20 NOTE — Discharge Instructions (Signed)
Aortic Valve Replacement, Care After °Refer to this sheet in the next few weeks. These instructions provide you with information about caring for yourself after your procedure. Your health care provider may also give you more specific instructions. Your treatment has been planned according to current medical practices, but problems sometimes occur. Call your health care provider if you have any problems or questions after your procedure. °What can I expect after the procedure? °After the procedure, it is common to have: °· Pain around your incision area. °· A small amount of blood or clear fluid coming from your incision. ° °Follow these instructions at home: °Eating and drinking ° °· Follow instructions from your health care provider about eating or drinking restrictions. °? Limit alcohol intake to no more than 1 drink per day for nonpregnant women and 2 drinks per day for men. One drink equals 12 oz of beer, 5 oz of wine, or 1½ oz of hard liquor. °? Limit how much caffeine you drink. Caffeine can affect your heart's rate and rhythm. °· Drink enough fluid to keep your urine clear or pale yellow. °· Eat a heart-healthy diet. This should include plenty of fresh fruits and vegetables. If you eat meat, it should be lean cuts. Avoid foods that are: °? High in salt, saturated fat, or sugar. °? Canned or highly processed. °? Fried. °Activity °· Return to your normal activities as told by your health care provider. Ask your health care provider what activities are safe for you. °· Exercise regularly once you have recovered, as told by your health care provider. °· Avoid sitting for more than 2 hours at a time without moving. Get up and move around at least once every 1-2 hours. This helps to prevent blood clots in the legs. °· Do not lift anything that is heavier than 10 lb (4.5 kg) until your health care provider approves. °· Avoid pushing or pulling things with your arms until your health care provider approves. This  includes pulling on handrails to help you climb stairs. °Incision care ° °· Follow instructions from your health care provider about how to take care of your incision. Make sure you: °? Wash your hands with soap and water before you change your bandage (dressing). If soap and water are not available, use hand sanitizer. °? Change your dressing as told by your health care provider. °? Leave stitches (sutures), skin glue, or adhesive strips in place. These skin closures may need to stay in place for 2 weeks or longer. If adhesive strip edges start to loosen and curl up, you may trim the loose edges. Do not remove adhesive strips completely unless your health care provider tells you to do that. °· Check your incision area every day for signs of infection. Check for: °? More redness, swelling, or pain. °? More fluid or blood. °? Warmth. °? Pus or a bad smell. °Medicines °· Take over-the-counter and prescription medicines only as told by your health care provider. °· If you were prescribed an antibiotic medicine, take it as told by your health care provider. Do not stop taking the antibiotic even if you start to feel better. °Travel °· Avoid airplane travel for as long as told by your health care provider. °· When you travel, bring a list of your medicines and a record of your medical history with you. Carry your medicines with you. °Driving °· Ask your health care provider when it is safe for you to drive. Do not drive until your health   care provider approves.  Do not drive or operate heavy machinery while taking prescription pain medicine. Lifestyle   Do not use any tobacco products, such as cigarettes, chewing tobacco, or e-cigarettes. If you need help quitting, ask your health care provider.  Resume sexual activity as told by your health care provider. Do not use medicines for erectile dysfunction unless your health care provider approves, if this applies.  Work with your health care provider to keep your  blood pressure and cholesterol under control, and to manage any other heart conditions that you have.  Maintain a healthy weight. General instructions  Do not take baths, swim, or use a hot tub until your health care provider approves.  Do not strain to have a bowel movement.  Avoid crossing your legs while sitting down.  Check your temperature every day for a fever. A fever may be a sign of infection.  If you are a woman and you plan to become pregnant, talk with your health care provider before you become pregnant.  Wear compression stockings if your health care provider instructs you to do this. These stockings help to prevent blood clots and reduce swelling in your legs.  Tell all health care providers who care for you that you have an artificial (prosthetic) aortic valve. If you have or have had heart disease or endocarditis, tell all health care providers about these conditions as well.  Keep all follow-up visits as told by your health care provider. This is important. Contact a health care provider if:  You develop a skin rash.  You experience sudden, unexplained changes in your weight.  You have more redness, swelling, or pain around your incision.  You have more fluid or blood coming from your incision.  Your incision feels warm to the touch.  You have pus or a bad smell coming from your incision.  You have a fever. Get help right away if:  You develop chest pain that is different from the pain coming from your incision. You develop shortness of breath or difficulty breathing. Coronary Artery Bypass Grafting, Care After These instructions give you information on caring for yourself after your procedure. Your doctor may also give you more specific instructions. Call your doctor if you have any problems or questions after your procedure. Follow these instructions at home:  Only take medicine as told by your doctor. Take medicines exactly as told. Do not stop taking  medicines or start any new medicines without talking to your doctor first.  Take your pulse as told by your doctor.  Do deep breathing as told by your doctor. Use your breathing device (incentive spirometer), if given, to practice deep breathing several times a day. Support your chest with a pillow or your arms when you take deep breaths or cough.  Keep the area clean, dry, and protected where the surgery cuts (incisions) were made. Remove bandages (dressings) only as told by your doctor. If strips were applied to surgical area, do not take them off. They fall off on their own.  Check the surgery area daily for puffiness (swelling), redness, or leaking fluid.  If surgery cuts were made in your legs: ? Avoid crossing your legs. ? Avoid sitting for long periods of time. Change positions every 30 minutes. ? Raise your legs when you are sitting. Place them on pillows.  Wear stockings that help keep blood clots from forming in your legs (compression stockings).  Only take sponge baths until your doctor says it is okay to  take showers. Pat the surgery area dry. Do not rub the surgery area with a washcloth or towel. Do not bathe, swim, or use a hot tub until your doctor says it is okay.  Eat foods that are high in fiber. These include raw fruits and vegetables, whole grains, beans, and nuts. Choose lean meats. Avoid canned, processed, and fried foods.  Drink enough fluids to keep your pee (urine) clear or pale yellow.  Weigh yourself every day.  Rest and limit activity as told by your doctor. You may be told to: ? Stop any activity if you have chest pain, shortness of breath, changes in heartbeat, or dizziness. Get help right away if this happens. ? Move around often for short amounts of time or take short walks as told by your doctor. Gradually become more active. You may need help to strengthen your muscles and build endurance. ? Avoid lifting, pushing, or pulling anything heavier than 10  pounds (4.5 kg) for at least 6 weeks after surgery.  Do not drive until your doctor says it is okay.  Ask your doctor when you can go back to work.  Ask your doctor when you can begin sexual activity again.  Follow up with your doctor as told. Contact a doctor if:  You have puffiness, redness, more pain, or fluid draining from the incision site.  You have a fever.  You have puffiness in your ankles or legs.  You have pain in your legs.  You gain 2 or more pounds (0.9 kg) a day.  You feel sick to your stomach (nauseous) or throw up (vomit).  You have watery poop (diarrhea). Get help right away if:  You have chest pain that goes to your jaw or arms.  You have shortness of breath.  You have a fast or irregular heartbeat.  You notice a "clicking" in your breastbone when you move.  You have numbness or weakness in your arms or legs.  You feel dizzy or light-headed. This information is not intended to replace advice given to you by your health care provider. Make sure you discuss any questions you have with your health care provider. Document Released: 12/21/2013 Document Revised: 05/23/2016 Document Reviewed: 05/25/2013 Elsevier Interactive Patient Education  2017 [employer]    You start to feel light-headed. These symptoms may represent a serious problem that is an emergency. Do not wait to see if the symptoms will go away. Get medical help right away. Call your local emergency services (911 in the [place] Do not drive yourself to the hospital. This information is not intended to replace advice given to you by your health care provider. Make sure you discuss any questions you have with your health care provider. Document Released: 07/04/2005 Document Revised: 05/23/2016 Document Reviewed: 11/19/2015 Elsevier Interactive Patient Education  2017 [employer]

## 2018-11-20 NOTE — Care Management Note (Signed)
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              Action/Plan: CM met with patient to discuss transitional needs. Patient lived at home alone, independent with ADLs PTA. PCP verified as: London Pepper; follows at [facility] Desma Maxim: [place]-(fax #) [phone] for progress notes and DC summary. CM left a VM with patients CSW at [facility], [name], requesting a return call to discuss POC. Patient verbalized his daughter, who lives in [place], will be available to assist post transition and provide transportation home. CM team will continue to follow.   Expected Discharge Date:                  Expected Discharge Plan:  Home/Self Care  In-House Referral:  NA  Discharge planning Services  CM Consult  Post Acute Care Choice:  NA Choice offered to:  NA  DME Arranged:  N/A DME Agency:  NA  HH Arranged:  NA HH Agency:  NA  Status of Service:  In process, will continue to follow  If discussed at Long Length of Stay Meetings, dates discussed:    Additional Comments:  Midge Minium RN, BSN, NCM-BC, ACM-RN [phone] 11/20/2018, 3:51 PM

## 2018-11-20 NOTE — Progress Notes (Signed)
[facility] BRIEF SICU PROGRESS NOTE  1 Day Post-Op  S/P Procedure(s) (LRB): AORTIC VALVE REPLACEMENT (AVR) using a 21mm Edwards Inspiris Aortic Valve (N/A) CORONARY ARTERY BYPASS GRAFTING (CABG) x1 using the left internal mammary artery (N/A) TRANSESOPHAGEAL ECHOCARDIOGRAM (TEE) (N/A)   Stable day AAI paced w/ stable BP Breathing comfortably on O2 0.5 L/min  UOP adequate Labs okay  Plan: Continue current plan  Purcell Nailslarence H , MD 11/20/2018 9:09 PM

## 2018-11-20 NOTE — Progress Notes (Addendum)
TCTS DAILY ICU PROGRESS NOTE                   [address]            [place] [address]          [phone]   1 Day Post-Op Procedure(s) (LRB): AORTIC VALVE REPLACEMENT (AVR) using a 21mm Edwards Inspiris Aortic Valve (N/A) CORONARY ARTERY BYPASS GRAFTING (CABG) x1 using the left internal mammary artery (N/A) TRANSESOPHAGEAL ECHOCARDIOGRAM (TEE) (N/A)  Total Length of Stay:  LOS: 1 day   Subjective: Looks and feels pretty well, he is a little SOB with some sputum production  Objective: Vital signs in last 24 hours: Temp:  [96.4 F (35.8 C)-100.2 F (37.9 C)] 98.8 F (37.1 C) (11/22 0800) Pulse Rate:  [80-88] 80 (11/22 0800) Cardiac Rhythm: Atrial paced (11/21 2000) Resp:  [14-40] 26 (11/22 0800) BP: (93-130)/(66-80) 112/72 (11/22 0800) SpO2:  [94 %-100 %] 96 % (11/22 0800) Arterial Line BP: (108-167)/(50-105) 122/63 (11/22 0700) FiO2 (%):  [40 %-50 %] 40 % (11/21 1611) Weight:  [75.3 kg] 75.3 kg (11/22 0600)  Filed Weights   11/19/18 0550 11/20/18 0600  Weight: 70.8 kg 75.3 kg    Weight change: 4.539 kg   Hemodynamic parameters for last 24 hours: PAP: (9-41)/(0-20) 29/13 CO:  [3 L/min-6.4 L/min] 5.2 L/min CI:  [1.6 L/min/m2-3.4 L/min/m2] 2.8 L/min/m2  Intake/Output from previous day: 11/21 0701 - 11/22 0700 In: 8340.2 [P.O.:600; I.V.:4598.9; Blood:2140; IV Piggyback:1001.2] Out: 4452 [Urine:2510; Blood:800; Chest Tube:1142]  Intake/Output this shift: Total I/O In: 31.4 [I.V.:31.4] Out: 65 [Urine:35; Chest Tube:30]  Current Meds: Scheduled Meds: . sodium chloride   Intravenous Once  . sodium chloride   Intravenous Once  . acetaminophen  1,000 mg Oral Q6H   Or  . acetaminophen (TYLENOL) oral liquid 160 mg/5 mL  1,000 mg Per Tube Q6H  . aspirin EC  325 mg Oral Daily   Or  . aspirin  324 mg Per Tube Daily  . bisacodyl  10 mg Oral Daily   Or  . bisacodyl  10 mg Rectal Daily  . docusate sodium  200 mg Oral Daily  . insulin aspart   0-24 Units Subcutaneous Q4H  . mouth rinse  15 mL Mouth Rinse BID  . [START ON 11/21/2018] pantoprazole  40 mg Oral Daily  . sodium chloride flush  3 mL Intravenous Q12H   Continuous Infusions: . sodium chloride Stopped (11/20/18 0759)  . sodium chloride    . sodium chloride 10 mL/hr at 11/19/18 2200  . albumin human 12.5 g (11/19/18 1305)  . dexmedetomidine (PRECEDEX) IV infusion Stopped (11/20/18 0726)  . lactated ringers    . lactated ringers    . lactated ringers 20 mL/hr at 11/20/18 0800  . levofloxacin (LEVAQUIN) IV    . nitroGLYCERIN Stopped (11/20/18 0618)  . phenylephrine (NEO-SYNEPHRINE) Adult infusion     PRN Meds:.sodium chloride, albumin human, lactated ringers, metoprolol tartrate, midazolam, morphine injection, ondansetron (ZOFRAN) IV, oxyCODONE, sodium chloride flush, traMADol  General appearance: alert, cooperative and no distress Heart: RRR without murmur or rub Lungs: clear to auscultation bilaterally Abdomen: benign Extremities: min edema Wound: dressings CDI  Lab Results: CBC: Recent Labs    11/19/18 1818 11/20/18 0307  WBC 10.0 7.0  HGB 11.7* 9.3*  HCT 37.7* 28.9*  PLT 116* 121*   BMET:  Recent Labs    11/19/18 1817 11/19/18 1818 11/20/18 0307  NA 140  --  139  K  4.4  --  4.2  CL 108  --  110  CO2  --   --  24  GLUCOSE 135*  --  108*  BUN 7*  --  9  CREATININE 0.90 1.08 1.02  CALCIUM  --   --  7.8*    CMET: Lab Results  Component Value Date   WBC 7.0 11/20/2018   HGB 9.3 (L) 11/20/2018   HCT 28.9 (L) 11/20/2018   PLT 121 (L) 11/20/2018   GLUCOSE 108 (H) 11/20/2018   ALT 12 11/16/2018   AST 16 11/16/2018   NA 139 11/20/2018   K 4.2 11/20/2018   CL 110 11/20/2018   CREATININE 1.02 11/20/2018   BUN 9 11/20/2018   CO2 24 11/20/2018   INR 1.71 11/19/2018   HGBA1C <3.5 (L) 11/16/2018      PT/INR:  Recent Labs    11/19/18 1307  LABPROT 19.8*  INR 1.71   Radiology: Dg Chest Port 1 View  Result Date: 11/19/2018 CLINICAL  DATA:  Atelectasis, status post aortic valve replacement. EXAM: PORTABLE CHEST 1 VIEW COMPARISON:  Radiographs of November 16, 2018. FINDINGS: The heart size and mediastinal contours are within normal limits. Endotracheal and nasogastric tubes are in grossly good position. Right internal jugular Swan-Ganz catheter is noted with tip in expected position of main pulmonary artery. Left-sided chest tube is noted with minimal left apical pneumothorax. Right lung is clear. Mild left basilar subsegmental atelectasis is noted. The visualized skeletal structures are unremarkable. IMPRESSION: Endotracheal and nasogastric tubes in grossly good position. Left-sided chest tube is noted with minimal left apical pneumothorax. Mild left basilar subsegmental atelectasis is noted. Electronically Signed   By: Lupita RaiderJames  Green Jr, M.D.   On: 11/19/2018 13:29     Assessment/Plan: S/P Procedure(s) (LRB): AORTIC VALVE REPLACEMENT (AVR) using a 21mm Edwards Inspiris Aortic Valve (N/A) CORONARY ARTERY BYPASS GRAFTING (CABG) x1 using the left internal mammary artery (N/A) TRANSESOPHAGEAL ECHOCARDIOGRAM (TEE) (N/A)  1 doing well POD#1 CABG/AVR 2 hemodyn stable on no pressors, sinus rhythm 3 sats good on 3 liters, mildly hypoxemic on ABG- ush pulm toilet/cardiac rehab as able 4 moderate CT drainage- cont tubes 5 good UOP, wt 5 KG over preop, creat normal , will begin gentle diuresis for volume overload 6 expected ABL anemia, h/o GI AVM's - monitor closely - did receive PRBC's 7 BS good control, HgA1C<3.5 preop 8 minor thrombocytopenia- monitor 9 routine progression orders Rowe ClackWayne E Gold PA-C 11/20/2018 8:21 AM   Pager [phone]  I have seen and examined the patient and agree with the assessment and plan as outlined.  Doing well POD1.  Maintaining NSR w/ stable hemodynamics, no drips.  Breathing comfortably on 3 L/min via [place] and CXR looks good.  EKG: NSR w/out acute ischemic changes.  Mobilize.  D/C lines.  Routine care.  D/C tubes later today or tomorrow, depending on output  Purcell Nailslarence H Donni Oglesby, MD 11/20/2018 8:49 AM

## 2018-11-21 ENCOUNTER — Inpatient Hospital Stay (HOSPITAL_COMMUNITY): Payer: No Typology Code available for payment source

## 2018-11-21 LAB — CBC
HEMATOCRIT: 32.2 % — AB (ref 39.0–52.0)
HEMOGLOBIN: 9.8 g/dL — AB (ref 13.0–17.0)
MCH: 28.6 pg (ref 26.0–34.0)
MCHC: 30.4 g/dL (ref 30.0–36.0)
MCV: 93.9 fL (ref 80.0–100.0)
Platelets: 123 10*3/uL — ABNORMAL LOW (ref 150–400)
RBC: 3.43 MIL/uL — ABNORMAL LOW (ref 4.22–5.81)
RDW: 14.5 % (ref 11.5–15.5)
WBC: 10.2 10*3/uL (ref 4.0–10.5)
nRBC: 0 % (ref 0.0–0.2)

## 2018-11-21 LAB — BASIC METABOLIC PANEL
Anion gap: 7 (ref 5–15)
BUN: 11 mg/dL (ref 8–23)
CALCIUM: 8.4 mg/dL — AB (ref 8.9–10.3)
CO2: 26 mmol/L (ref 22–32)
Chloride: 101 mmol/L (ref 98–111)
Creatinine, Ser: 1.28 mg/dL — ABNORMAL HIGH (ref 0.61–1.24)
GFR calc Af Amer: >60 mL/min (ref >60)
GFR, EST NON AFRICAN AMERICAN: 55 mL/min — AB (ref >60)
Glucose, Bld: 125 mg/dL — ABNORMAL HIGH (ref 70–99)
Potassium: 4.4 mmol/L (ref 3.5–5.1)
Sodium: 134 mmol/L — ABNORMAL LOW (ref 135–145)

## 2018-11-21 MED ORDER — MUPIROCIN 2 % EX OINT
1.0000 "application " | TOPICAL_OINTMENT | Freq: Two times a day (BID) | CUTANEOUS | Status: DC
  Administered 2018-11-21 – 2018-11-24 (×7): 1 via NASAL
  Filled 2018-11-21 (×3): qty 22

## 2018-11-21 MED ORDER — DIPHENHYDRAMINE HCL 12.5 MG/5ML PO ELIX
12.5000 mg | ORAL_SOLUTION | Freq: Four times a day (QID) | ORAL | Status: DC | PRN
  Filled 2018-11-21: qty 5

## 2018-11-21 MED ORDER — ATORVASTATIN CALCIUM 10 MG PO TABS
20.0000 mg | ORAL_TABLET | Freq: Every day | ORAL | Status: DC
  Administered 2018-11-21: 20 mg via ORAL
  Filled 2018-11-21: qty 2

## 2018-11-21 MED ORDER — NALOXONE HCL 0.4 MG/ML IJ SOLN: 0.4000 mg | INTRAMUSCULAR | Status: DC | PRN

## 2018-11-21 MED ORDER — METOPROLOL TARTRATE 25 MG PO TABS
25.0000 mg | ORAL_TABLET | Freq: Two times a day (BID) | ORAL | Status: DC
  Administered 2018-11-21 – 2018-11-22 (×4): 25 mg via ORAL
  Filled 2018-11-21 (×3): qty 1

## 2018-11-21 MED ORDER — SODIUM CHLORIDE 0.9% FLUSH
3.0000 mL | Freq: Two times a day (BID) | INTRAVENOUS | Status: DC
  Administered 2018-11-21 – 2018-11-24 (×4): 3 mL via INTRAVENOUS

## 2018-11-21 MED ORDER — CHLORHEXIDINE GLUCONATE CLOTH 2 % EX PADS
6.0000 | MEDICATED_PAD | Freq: Every day | CUTANEOUS | Status: DC
  Administered 2018-11-21 – 2018-11-22 (×2): 6 via TOPICAL

## 2018-11-21 MED ORDER — FUROSEMIDE 10 MG/ML IJ SOLN
40.0000 mg | Freq: Two times a day (BID) | INTRAMUSCULAR | Status: AC
  Administered 2018-11-21 – 2018-11-22 (×2): 40 mg via INTRAVENOUS
  Filled 2018-11-21 (×2): qty 4

## 2018-11-21 MED ORDER — FUROSEMIDE 40 MG PO TABS
40.0000 mg | ORAL_TABLET | Freq: Every day | ORAL | Status: DC
  Filled 2018-11-21: qty 1

## 2018-11-21 MED ORDER — SODIUM CHLORIDE 0.9 % IV SOLN: 250.0000 mL | INTRAVENOUS | Status: DC | PRN

## 2018-11-21 MED ORDER — TAMSULOSIN HCL 0.4 MG PO CAPS
0.4000 mg | ORAL_CAPSULE | Freq: Every day | ORAL | Status: DC
  Administered 2018-11-21 – 2018-11-24 (×4): 0.4 mg via ORAL
  Filled 2018-11-21 (×3): qty 1

## 2018-11-21 MED ORDER — SODIUM CHLORIDE 0.9% FLUSH: 3.0000 mL | INTRAVENOUS | Status: DC | PRN

## 2018-11-21 MED ORDER — SODIUM CHLORIDE 0.9% FLUSH: 9.0000 mL | INTRAVENOUS | Status: DC | PRN

## 2018-11-21 MED ORDER — FE FUMARATE-B12-VIT C-FA-IFC PO CAPS
1.0000 | ORAL_CAPSULE | Freq: Three times a day (TID) | ORAL | Status: DC
  Administered 2018-11-21 – 2018-11-24 (×8): 1 via ORAL
  Filled 2018-11-21 (×8): qty 1

## 2018-11-21 MED ORDER — POTASSIUM CHLORIDE CRYS ER 20 MEQ PO TBCR
20.0000 meq | EXTENDED_RELEASE_TABLET | Freq: Two times a day (BID) | ORAL | Status: DC
  Administered 2018-11-21 – 2018-11-22 (×4): 20 meq via ORAL
  Filled 2018-11-21 (×3): qty 1

## 2018-11-21 MED ORDER — DIPHENHYDRAMINE HCL 50 MG/ML IJ SOLN: 12.5000 mg | Freq: Four times a day (QID) | INTRAMUSCULAR | Status: DC | PRN

## 2018-11-21 MED ORDER — MOVING RIGHT ALONG BOOK
Freq: Once | Status: AC
  Administered 2018-11-21: 14:00:00
  Filled 2018-11-21: qty 1

## 2018-11-21 MED ORDER — CYCLOSPORINE 0.05 % OP EMUL: 1.0000 [drp] | Freq: Two times a day (BID) | OPHTHALMIC | Status: DC

## 2018-11-21 MED ORDER — FENTANYL 40 MCG/ML IV SOLN
INTRAVENOUS | Status: DC
  Administered 2018-11-21: 50 ug via INTRAVENOUS
  Administered 2018-11-21: 180 ug via INTRAVENOUS
  Administered 2018-11-21: 8 ug via INTRAVENOUS
  Administered 2018-11-22: 135 ug via INTRAVENOUS
  Administered 2018-11-22: 182 ug via INTRAVENOUS
  Administered 2018-11-22: 1000 ug via INTRAVENOUS
  Filled 2018-11-21: qty 1000
  Filled 2018-11-21: qty 25

## 2018-11-21 NOTE — Progress Notes (Signed)
[address]       [name] [address]             [phone]        CARDIOTHORACIC SURGERY PROGRESS NOTE   R2 Days Post-Op Procedure(s) (LRB): AORTIC VALVE REPLACEMENT (AVR) using a 21mm Edwards Inspiris Aortic Valve (N/A) CORONARY ARTERY BYPASS GRAFTING (CABG) x1 using the left internal mammary artery (N/A) TRANSESOPHAGEAL ECHOCARDIOGRAM (TEE) (N/A)  Subjective: Patient continues to report inadequate pain relief due to high tolerance from long-standing narcotic abuse PTA.  No SOB.  Eating some.  Objective: Vital signs: BP Readings from Last 1 Encounters:  11/21/18 131/71   Pulse Readings from Last 1 Encounters:  11/21/18 77   Resp Readings from Last 1 Encounters:  11/21/18 (!) 21   Temp Readings from Last 1 Encounters:  11/21/18 99 F (37.2 C) (Oral)    Hemodynamics:    Physical Exam:  Rhythm:   sinus  Breath sounds: clear  Heart sounds:  RRR  Incisions:  Dressing dry, intact  Abdomen:  Soft, non-distended, non-tender  Extremities:  Warm, well-perfused  Chest tubes:  low volume thin serosanguinous output, no air leak    Intake/Output from previous day: 11/22 0701 - 11/23 0700 In: 874.4 [P.O.:240; I.V.:484.3; IV Piggyback:150.1] Out: 2085 [Urine:1715; Chest Tube:370] Intake/Output this shift: No intake/output data recorded.  Lab Results:  CBC: Recent Labs    11/20/18 1523 11/20/18 1525 11/21/18 0412  WBC 9.9  --  10.2  HGB 10.1* 10.2* 9.8*  HCT 32.6* 30.0* 32.2*  PLT 127*  --  123*    BMET:  Recent Labs    11/20/18 0307  11/20/18 1525 11/21/18 0412  NA 139  --  137 134*  K 4.2  --  4.7 4.4  CL 110  --  102 101  CO2 24  --   --  26  GLUCOSE 108*  --  119* 125*  BUN 9  --  12 11  CREATININE 1.02   < > 1.10 1.28*  CALCIUM 7.8*  --   --  8.4*   < > = values in this interval not displayed.     PT/INR:   Recent Labs    11/19/18 1307  LABPROT 19.8*  INR 1.71    CBG (last 3)  Recent Labs    11/20/18 0732  11/20/18 1120 11/20/18 1508  GLUCAP 115* 117* 122*    ABG    Component Value Date/Time   PHART 7.344 (L) 11/19/2018 1754   PCO2ART 40.2 11/19/2018 1754   PO2ART 74.0 (L) 11/19/2018 1754   HCO3 21.7 11/19/2018 1754   TCO2 27 11/20/2018 1525   ACIDBASEDEF 4.0 (H) 11/19/2018 1754   O2SAT 93.0 11/19/2018 1754    CXR: PORTABLE CHEST 1 VIEW  COMPARISON:  Chest radiograph 11/20/2018  FINDINGS: Right IJ approach vascular sheath in place. Interval retraction PA catheter. Mediastinal drains in place. Left chest tube in place. Stable cardiac and mediastinal contours status post median sternotomy. Minimal heterogeneous opacities lung bases bilaterally. Persistent tiny left apical pneumothorax.  IMPRESSION: Persistent tiny left apical pneumothorax.  Left chest tube in place.  Similar-appearing basilar heterogeneous opacities which may represent atelectasis.   Electronically Signed   By: Annia Belt M.D.   On: 11/21/2018 08:17   Assessment/Plan: S/P Procedure(s) (LRB): AORTIC VALVE REPLACEMENT (AVR) using a 21mm Edwards Inspiris Aortic Valve (N/A) CORONARY ARTERY BYPASS GRAFTING (CABG) x1 using the left internal mammary artery (N/A) TRANSESOPHAGEAL ECHOCARDIOGRAM (TEE) (N/A)  Overall doing well POD2 Maintaining NSR w/ stable BP Breathing comfortably w/ O2 sats 93% and CXR clear Long-standing narcotic dependence/abuse PTA - now w/ post op pain Expected post op acute blood loss anemia with preop chronic anemia, stable Expected post op atelectasis, very mild Expected post op volume excess, weight reportedly 3 kg > preop, UOP adequate   Will try PCA   Mobilize  D/C tubes and lines  Transfer step down   Purcell Nailslarence H Jalisha Enneking, MD 11/21/2018 10:08 AM

## 2018-11-21 NOTE — Plan of Care (Signed)
  Problem: Clinical Measurements: Goal: Ability to maintain clinical measurements within normal limits will improve Outcome: Progressing Goal: Respiratory complications will improve Outcome: Progressing   

## 2018-11-21 NOTE — Progress Notes (Signed)
Patient received from [facility] nurse, Victorino DikeJennifer, RN.  Pt oriented to unit and to room.  Vital sign obtained and were stable, call bell within reach, and bed in lowest position.

## 2018-11-22 ENCOUNTER — Inpatient Hospital Stay (HOSPITAL_COMMUNITY): Payer: No Typology Code available for payment source

## 2018-11-22 LAB — BASIC METABOLIC PANEL
ANION GAP: 10 (ref 5–15)
BUN: 12 mg/dL (ref 8–23)
CALCIUM: 8.4 mg/dL — AB (ref 8.9–10.3)
CHLORIDE: 98 mmol/L (ref 98–111)
CO2: 27 mmol/L (ref 22–32)
Creatinine, Ser: 0.97 mg/dL (ref 0.61–1.24)
GFR calc Af Amer: >60 mL/min (ref >60)
GFR calc non Af Amer: >60 mL/min (ref >60)
GLUCOSE: 103 mg/dL — AB (ref 70–99)
Potassium: 3.8 mmol/L (ref 3.5–5.1)
Sodium: 135 mmol/L (ref 135–145)

## 2018-11-22 LAB — CBC
HEMATOCRIT: 29.1 % — AB (ref 39.0–52.0)
HEMOGLOBIN: 9.2 g/dL — AB (ref 13.0–17.0)
MCH: 28.8 pg (ref 26.0–34.0)
MCHC: 31.6 g/dL (ref 30.0–36.0)
MCV: 91.2 fL (ref 80.0–100.0)
Platelets: 97 10*3/uL — ABNORMAL LOW (ref 150–400)
RBC: 3.19 MIL/uL — ABNORMAL LOW (ref 4.22–5.81)
RDW: 13.7 % (ref 11.5–15.5)
WBC: 8.6 10*3/uL (ref 4.0–10.5)
nRBC: 0 % (ref 0.0–0.2)

## 2018-11-22 MED ORDER — ATORVASTATIN CALCIUM 80 MG PO TABS
80.0000 mg | ORAL_TABLET | Freq: Every day | ORAL | Status: DC
  Administered 2018-11-22 – 2018-11-23 (×2): 80 mg via ORAL
  Filled 2018-11-22 (×2): qty 1

## 2018-11-22 MED ORDER — FUROSEMIDE 10 MG/ML IJ SOLN
INTRAMUSCULAR | Status: AC
  Administered 2018-11-22: 40 mg
  Filled 2018-11-22: qty 4

## 2018-11-22 MED ORDER — ASPIRIN EC 81 MG PO TBEC
81.0000 mg | DELAYED_RELEASE_TABLET | Freq: Every day | ORAL | Status: DC
  Administered 2018-11-23 – 2018-11-24 (×2): 81 mg via ORAL
  Filled 2018-11-22 (×2): qty 1

## 2018-11-22 MED ORDER — CLOPIDOGREL BISULFATE 75 MG PO TABS: 75.0000 mg | ORAL_TABLET | Freq: Every day | ORAL | Status: DC

## 2018-11-22 MED ORDER — POTASSIUM CHLORIDE CRYS ER 20 MEQ PO TBCR
40.0000 meq | EXTENDED_RELEASE_TABLET | Freq: Once | ORAL | Status: AC
  Administered 2018-11-22: 40 meq via ORAL
  Filled 2018-11-22: qty 2

## 2018-11-22 NOTE — Progress Notes (Addendum)
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  Pain is better controlled with PCA.  Patient has pain contract in [place], however he is stating facility is closed?  Objective: Vital signs in last 24 hours: Temp:  [98.1 F (36.7 C)-99 F (37.2 C)] 98.5 F (36.9 C) (11/24 0404) Pulse Rate:  [65-93] 65 (11/24 0012) Cardiac Rhythm: Normal sinus rhythm (11/24 0700) Resp:  [15-31] 19 (11/24 0538) BP: (113-153)/(55-72) 126/62 (11/24 0404) SpO2:  [94 %-100 %] 96 % (11/24 0426) Weight:  [69.5 kg] 69.5 kg (11/24 0538)  Intake/Output from previous day: 11/23 0701 - 11/24 0700 In: 325.3 [P.O.:240; I.V.:85.3] Out: 1975 [Urine:1975]  General appearance: alert, cooperative and no distress Heart: regular rate and rhythm Lungs: diminished breath sounds bibasilar Abdomen: soft, non-tender; bowel sounds normal; no masses,  no organomegaly Extremities: edema trace to 1+ Wound: clean and dry  Lab Results: Recent Labs    11/21/18 0412 11/22/18 0247  WBC 10.2 8.6  HGB 9.8* 9.2*  HCT 32.2* 29.1*  PLT 123* 97*   BMET:  Recent Labs    11/21/18 0412 11/22/18 0247  NA 134* 135  K 4.4 3.8  CL 101 98  CO2 26 27  GLUCOSE 125* 103*  BUN 11 12  CREATININE 1.28* 0.97  CALCIUM 8.4* 8.4*    PT/INR:  Recent Labs    11/19/18 1307  LABPROT 19.8*  INR 1.71   ABG    Component Value Date/Time   PHART 7.344 (L) 11/19/2018 1754   HCO3 21.7 11/19/2018 1754   TCO2 27 11/20/2018 1525   ACIDBASEDEF 4.0 (H) 11/19/2018 1754   O2SAT 93.0 11/19/2018 1754   CBG (last 3)  Recent Labs    11/20/18 0732 11/20/18 1120 11/20/18 1508  GLUCAP 115* 117* 122*     Assessment/Plan: S/P Procedure(s) (LRB): AORTIC VALVE REPLACEMENT (AVR) using a 21mm Edwards Inspiris Aortic Valve (N/A) CORONARY ARTERY BYPASS GRAFTING (CABG) x1 using the left internal mammary artery (N/A) TRANSESOPHAGEAL ECHOCARDIOGRAM (TEE) (N/A)  1. CV- NSR, isolated episode of hypertension yesterday afternoon- continue Lopressor 25 mg BID, if HTN persists can start low dose ACE 2. Pulm- CXR with small right pleural effusion, atelectasis bilaterally, continue IS 3. Renal- creatinine WNL, weight is trending down, Pitting edema on exam, continue Lasix, supplement K 4. 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 Dispo- patient stable, pain under much better control with PCA, will d/c EPW today as maintaining NSR, will need to get pain controlled on oral medication over the next few days to be able to discharge home once medically appropriate  LOS: 3 days    Lowella Dandyrin Barrett 11/22/2018  I have seen and examined the patient and agree with the assessment and plan as outlined.  We will provide prescription for oxycodone at hospital discharge but patient will need to f/u with previous pain management clinic for long term follow up.  Check routine f/u ECHO tomorrow.  Possible d/c home tomorrow - patient's daughter available to stay with him this week.  Patient is requesting referral to a cardiologist in [facility] for long-term follow up since he does not have one.  Purcell Nails, MD 11/22/2018 10:16 AM

## 2018-11-22 NOTE — Progress Notes (Signed)
Patient up to chairx1 today. Will monitor patient. Kassadie Pancake Jessup RN  

## 2018-11-22 NOTE — Progress Notes (Signed)
Pacing wires DCd as ordered and per protocol all ends intact. Slight resistance met with right atrial wires. Patient reminded to lie supine approximately one hour. Patient NSR heart rate 88 on Monitor. Jaeley Wiker, Bettina Gavia rN

## 2018-11-23 ENCOUNTER — Inpatient Hospital Stay (HOSPITAL_COMMUNITY): Payer: No Typology Code available for payment source

## 2018-11-23 ENCOUNTER — Ambulatory Visit (HOSPITAL_COMMUNITY): Payer: No Typology Code available for payment source

## 2018-11-23 ENCOUNTER — Encounter (HOSPITAL_COMMUNITY): Payer: Self-pay | Admitting: Cardiology

## 2018-11-23 ENCOUNTER — Other Ambulatory Visit (HOSPITAL_COMMUNITY): Payer: No Typology Code available for payment source

## 2018-11-23 DIAGNOSIS — I25118 Atherosclerotic heart disease of native coronary artery with other forms of angina pectoris: Secondary | ICD-10-CM

## 2018-11-23 DIAGNOSIS — I480 Paroxysmal atrial fibrillation: Secondary | ICD-10-CM

## 2018-11-23 DIAGNOSIS — Z953 Presence of xenogenic heart valve: Secondary | ICD-10-CM

## 2018-11-23 LAB — BPAM RBC
BLOOD PRODUCT EXPIRATION DATE: 201912192359
BLOOD PRODUCT EXPIRATION DATE: 201912192359
BLOOD PRODUCT EXPIRATION DATE: 201912192359
BLOOD PRODUCT EXPIRATION DATE: 201912192359
Blood Product Expiration Date: 201912192359
Blood Product Expiration Date: 201912192359
ISSUE DATE / TIME: 201911210730
ISSUE DATE / TIME: 201911210730
ISSUE DATE / TIME: 201911211054
ISSUE DATE / TIME: 201911211054
ISSUE DATE / TIME: 201911211341
ISSUE DATE / TIME: 201911211517
UNIT TYPE AND RH: 5100
Unit Type and Rh: 5100
Unit Type and Rh: 5100
Unit Type and Rh: 5100
Unit Type and Rh: 5100
Unit Type and Rh: 5100

## 2018-11-23 LAB — TYPE AND SCREEN
ABO/RH(D): O POS
Antibody Screen: NEGATIVE
UNIT DIVISION: 0
UNIT DIVISION: 0
Unit division: 0
Unit division: 0
Unit division: 0
Unit division: 0

## 2018-11-23 LAB — BASIC METABOLIC PANEL
ANION GAP: 6 (ref 5–15)
BUN: 14 mg/dL (ref 8–23)
CO2: 31 mmol/L (ref 22–32)
Calcium: 8.5 mg/dL — ABNORMAL LOW (ref 8.9–10.3)
Chloride: 100 mmol/L (ref 98–111)
Creatinine, Ser: 1.08 mg/dL (ref 0.61–1.24)
GFR calc Af Amer: >60 mL/min (ref >60)
GLUCOSE: 115 mg/dL — AB (ref 70–99)
Potassium: 3.5 mmol/L (ref 3.5–5.1)
SODIUM: 137 mmol/L (ref 135–145)

## 2018-11-23 LAB — ECHOCARDIOGRAM COMPLETE
Height: 70 in
Weight: 2370.39 oz

## 2018-11-23 LAB — MAGNESIUM: MAGNESIUM: 2.1 mg/dL (ref 1.7–2.4)

## 2018-11-23 MED ORDER — METOPROLOL TARTRATE 50 MG PO TABS
50.0000 mg | ORAL_TABLET | Freq: Two times a day (BID) | ORAL | Status: DC
  Administered 2018-11-23 – 2018-11-24 (×3): 50 mg via ORAL
  Filled 2018-11-23 (×3): qty 1

## 2018-11-23 MED ORDER — PERFLUTREN LIPID MICROSPHERE
INTRAVENOUS | Status: AC
  Filled 2018-11-23: qty 10

## 2018-11-23 MED ORDER — AMIODARONE HCL 200 MG PO TABS
200.0000 mg | ORAL_TABLET | Freq: Two times a day (BID) | ORAL | Status: DC
  Administered 2018-11-23 – 2018-11-24 (×2): 200 mg via ORAL
  Filled 2018-11-23 (×3): qty 1

## 2018-11-23 MED ORDER — PERFLUTREN LIPID MICROSPHERE
1.0000 mL | INTRAVENOUS | Status: AC | PRN
  Administered 2018-11-23: 10 mL via INTRAVENOUS
  Filled 2018-11-23: qty 10

## 2018-11-23 NOTE — Progress Notes (Addendum)
[address]       [place] [address]             [phone]      4 Days Post-Op Procedure(s) (LRB): AORTIC VALVE REPLACEMENT (AVR) using a 21mm Edwards Inspiris Aortic Valve (N/A) CORONARY ARTERY BYPASS GRAFTING (CABG) x1 using the left internal mammary artery (N/A) TRANSESOPHAGEAL ECHOCARDIOGRAM (TEE) (N/A) Subjective: Sore from surgery, but pain is chronic neck/back  Objective: Vital signs in last 24 hours: Temp:  [97.6 F (36.4 C)-98.2 F (36.8 C)] 98.2 F (36.8 C) (11/25 0428) Pulse Rate:  [68-89] 68 (11/25 0428) Cardiac Rhythm: Supraventricular tachycardia (11/25 0329) Resp:  [16-30] 19 (11/25 0555) BP: (111-142)/(56-87) 111/61 (11/25 0428) SpO2:  [94 %-100 %] 94 % (11/25 0555) Weight:  [67.2 kg] 67.2 kg (11/25 0428)  Hemodynamic parameters for last 24 hours:    Intake/Output from previous day: 11/24 0701 - 11/25 0700 In: -  Out: 1000 [Urine:1000] Intake/Output this shift: No intake/output data recorded.  General appearance: alert, cooperative and no distress Heart: irregularly irregular rhythm Lungs: clear to auscultation bilaterally Abdomen: benign Extremities: no edema Wound: incis healing well  Lab Results: Recent Labs    11/21/18 0412 11/22/18 0247  WBC 10.2 8.6  HGB 9.8* 9.2*  HCT 32.2* 29.1*  PLT 123* 97*   BMET:  Recent Labs    11/21/18 0412 11/22/18 0247  NA 134* 135  K 4.4 3.8  CL 101 98  CO2 26 27  GLUCOSE 125* 103*  BUN 11 12  CREATININE 1.28* 0.97  CALCIUM 8.4* 8.4*    PT/INR: No results for input(s): LABPROT, INR in the last 72 hours. ABG    Component Value Date/Time   PHART 7.344 (L) 11/19/2018 1754   HCO3 21.7 11/19/2018 1754   TCO2 27 11/20/2018 1525   ACIDBASEDEF 4.0 (H) 11/19/2018 1754   O2SAT 93.0 11/19/2018 1754   CBG (last 3)  Recent Labs    11/20/18 0732 11/20/18 1120 11/20/18 1508  GLUCAP 115* 117* 122*    Meds Scheduled Meds: . acetaminophen  1,000 mg Oral Q6H  . aspirin  EC  81 mg Oral Daily  . atorvastatin  80 mg Oral q1800  . bisacodyl  10 mg Oral Daily   Or  . bisacodyl  10 mg Rectal Daily  . Chlorhexidine Gluconate Cloth  6 each Topical Daily  . docusate sodium  200 mg Oral Daily  . fentaNYL   Intravenous Q4H  . ferrous fumarate-b12-vitamic C-folic acid  1 capsule Oral TID PC  . furosemide  40 mg Oral Daily  . mouth rinse  15 mL Mouth Rinse BID  . metoprolol tartrate  25 mg Oral BID  . mupirocin ointment  1 application Nasal BID  . pantoprazole  40 mg Oral Daily  . potassium chloride  20 mEq Oral BID  . sodium chloride flush  3 mL Intravenous Q12H  . tamsulosin  0.4 mg Oral Daily   Continuous Infusions: . sodium chloride    . sodium chloride    . lactated ringers Stopped (11/21/18 1115)   PRN Meds:.sodium chloride, diphenhydrAMINE **OR** diphenhydrAMINE, metoprolol tartrate, naloxone **AND** sodium chloride flush, ondansetron (ZOFRAN) IV, oxyCODONE, sodium chloride flush, traMADol  Xrays Dg Chest 2 View  Result Date: 11/22/2018 CLINICAL DATA:  Post open heart surgery, post removal of 3 tubes today EXAM: CHEST - 2 VIEW COMPARISON:  11/21/2018 FINDINGS: Epicardial pacing wires are present. Enlargement of cardiac silhouette post median sternotomy and AVR. Atherosclerotic  calcification aorta. Pulmonary vascularity normal. Bibasilar atelectasis and tiny bibasilar pleural effusions. Linear subsegmental atelectasis RIGHT upper lobe again seen. Persistent tiny LEFT apical pneumothorax unchanged. Remaining lungs clear. Bones demineralized. IMPRESSION: Bibasilar atelectasis and tiny pleural effusions. Persistent tiny LEFT apex pneumothorax and RIGHT upper lobe subsegmental atelectasis. Electronically Signed   By: Ulyses SouthwardMark  Boles M.D.   On: 11/22/2018 08:26    Assessment/Plan: S/P Procedure(s) (LRB): AORTIC VALVE REPLACEMENT (AVR) using a 21mm Edwards Inspiris Aortic Valve (N/A) CORONARY ARTERY BYPASS GRAFTING (CABG) x1 using the left internal mammary artery  (N/A) TRANSESOPHAGEAL ECHOCARDIOGRAM (TEE) (N/A)  1 doing well overall POD # 4 AVR CABG 2 Sats good on RA 3 tele with freq PAC's, short episodes of (?) afib/SVT, BP excellent control- increase beta blocker dose, may need additional antidysrhythmic if conts 4 No new labs/CXR- will recheck bmet/Mg++ 5 CBG controlled 6 echo ordered 7 weight below preop- will d/c lasix as no sig signs of volume overload 8 d/c fentanyl- Oxy at discharge 9 needs cardiology F/U arranged  LOS: 4 days    Rowe ClackWayne E Gold [facility] 11/23/2018 Pager [phone]  I have seen and examined the patient and agree with the assessment and plan as outlined.  Tentatively plan d/c home tomorrow.  Increase beta blocker and consider adding amiodarone.  Would likely be best to avoid systemic anticoagulation due to high risk of GI bleeding.  Check routine f/u ECHO.  Will ask Cardiology to see as patient hopes to establish long term care in [place].  Purcell Nailslarence H Owen, MD 11/23/2018

## 2018-11-23 NOTE — Progress Notes (Signed)
PCA d/c per order. Pt educated on new pain regimen. 10mg  oxycodone given for pain scale 7/10. 3mg  of Fentanyl wasted in stericycle with Claris GowerKristin RN.  Versie StarksHanna  Temara Lanum, RN

## 2018-11-23 NOTE — Consult Note (Addendum)
Cardiology Consultation:   Patient ID: Jonathan Holland MRN: [id number]; DOB: March 03, 1946  Admit date: 11/19/2018 Date of Consult: 11/23/2018  Primary Care Provider: Farris Has, MD Primary Cardiologist: Kristeen Miss, MD NEW Primary Electrophysiologist:  None    Patient Profile:   Jonathan Holland is a 72 y.o. male with a hx of AS, HTN, HLD, +tobacco use, and COPD, chronic anemia and hx of GI bleed with AVMs, admitted with severe AS and significant diffuse CAD and underwent AVR with 21mm Edwards inspiris aortic valve and CABG X 1 LIMA to LAD who is being seen today for the evaluation of his CAD at the request of Dr. Cornelius Moras..  History of Present Illness:   Jonathan Holland with a hx of AS, HTN, HLD, +tobacco use, and COPD, chronic anemia and hx of GI bleed with AVMs, admitted with severe AS and significant diffuse CAD and underwent AVR with 21mm Edwards inspiris aortic valve and CABG X 1 LIMA to LAD on 11/19/18.    Pt is progressing well post op.  Did have HTN on the 23rd.  Pt requested to be seen by Cardiology here in [place].    Today is POD #4, does have PACs and short episode of possible SVT/A fib.   His lasix was stopped due to euvolemia.   Na 137, K+ 3.5, Cr 1.08 Mg+ 2.1  Hgb 9.2   2 V CXR : IMPRESSION: Bibasilar atelectasis and tiny pleural effusions. Persistent tiny LEFT apex pneumothorax and RIGHT upper lobe subsegmental atelectasis. EKG SR to Sinus arrhythmia  I personally reviewed  Tele- I personally reviewed SR with + SVT some appears to be PAF.   Currently sitting up in chair without complaints. He is hard of hearing, arrhyhtmia as above.  He is not aware of the tachycardia.  He has prn order for IV BB. Otherwise 50 mg BID.   Ambulating with cardiac rehab without issues.    Past Medical History:  Diagnosis Date  . Anemia   . Arthritis   . Atherosclerosis of aorta (HCC) 03/26/2016  . Chronic pain   . COPD (chronic obstructive pulmonary disease) (HCC)   . Dyspnea     NOW HE HAS SOB DURING EXCERCISE  . GERD (gastroesophageal reflux disease)   . Heart murmur   . Heart murmur 03/26/2016  . Hyperlipidemia   . Hyperlipidemia with target LDL less than 70 03/26/2016  . Hypertension   . PTSD (post-traumatic stress disorder)   . S/P aortic valve replacement with bioprosthetic valve 11/19/2018   21 mm Edwards Inspiris Resilia stented bovine pericardial tissue valve  . 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 SOB (shortness of breath)   . Tobacco abuse 03/26/2016    Past Surgical History:  Procedure Laterality Date  .  tonsilectomy as a child    . ADENOIDECTOMY    . AORTIC VALVE REPLACEMENT N/A 11/19/2018   Procedure: AORTIC VALVE REPLACEMENT (AVR) using a 21mm Edwards Inspiris Aortic Valve;  Surgeon: Purcell Nails, MD;  Location: [facility] OR;  Service: Open Heart Surgery;  Laterality: N/A;  . CARDIAC CATHETERIZATION  10/29/2018   done at [facility]  . CORONARY ARTERY BYPASS GRAFT N/A 11/19/2018   Procedure: CORONARY ARTERY BYPASS GRAFTING (CABG) x1 using the left internal mammary artery;  Surgeon: Purcell Nails, MD;  Location: [facility] OR;  Service: Open Heart Surgery;  Laterality: N/A;  . HERNIA REPAIR    . SHOULDER ARTHROSCOPY    . TEE WITHOUT CARDIOVERSION N/A  11/19/2018   Procedure: TRANSESOPHAGEAL ECHOCARDIOGRAM (TEE);  Surgeon: Purcell Nailswen, Clarence H, MD;  Location: [facility] OR;  Service: Open Heart Surgery;  Laterality: N/A;  . TONSILLECTOMY       Home Medications:  Prior to Admission medications   Medication Sig Start Date End Date Taking? Authorizing Provider  albuterol (PROVENTIL HFA;VENTOLIN HFA) 108 (90 Base) MCG/ACT inhaler Inhale 2 puffs into the lungs every 4 (four) hours as needed for wheezing or shortness of breath.   Yes [provider]  aspirin EC 81 MG tablet Take 81 mg by mouth daily.  10/30/18  Yes [provider]  atorvastatin (LIPITOR) 20 MG tablet Take 20 mg by mouth at bedtime.    Yes [provider]  ferrous  sulfate 325 (65 FE) MG tablet Take 325 mg by mouth See admin instructions. Twice daily with meals on Monday, Wednesday, Friday 10/30/18  Yes [provider]  Multiple Vitamin (MULTIVITAMIN) tablet Take 1 tablet by mouth daily.   Yes [provider]  oxyCODONE (ROXICODONE) 15 MG immediate release tablet Take 15 mg by mouth every 8 (eight) hours as needed for pain.   Yes [provider]  pantoprazole (PROTONIX) 40 MG tablet Take 40 mg by mouth daily as needed (heartburn).  10/31/18  Yes [provider]  tamsulosin (FLOMAX) 0.4 MG CAPS capsule Take 0.4 mg by mouth daily.    Yes [provider]  vitamin B-12 (CYANOCOBALAMIN) 1000 MCG tablet Take 1,000 mcg by mouth daily.  10/31/18  Yes [provider]  vitamin C (ASCORBIC ACID) 500 MG tablet Take 500 mg by mouth daily.   Yes [provider]    Inpatient Medications: Scheduled Meds: . acetaminophen  1,000 mg Oral Q6H  . aspirin EC  81 mg Oral Daily  . atorvastatin  80 mg Oral q1800  . bisacodyl  10 mg Oral Daily   Or  . bisacodyl  10 mg Rectal Daily  . Chlorhexidine Gluconate Cloth  6 each Topical Daily  . docusate sodium  200 mg Oral Daily  . ferrous fumarate-b12-vitamic C-folic acid  1 capsule Oral TID PC  . mouth rinse  15 mL Mouth Rinse BID  . metoprolol tartrate  50 mg Oral BID  . mupirocin ointment  1 application Nasal BID  . pantoprazole  40 mg Oral Daily  . sodium chloride flush  3 mL Intravenous Q12H  . tamsulosin  0.4 mg Oral Daily   Continuous Infusions: . sodium chloride    . sodium chloride    . lactated ringers Stopped (11/21/18 1115)   PRN Meds: sodium chloride, metoprolol tartrate, ondansetron (ZOFRAN) IV, oxyCODONE, sodium chloride flush, traMADol  Allergies:    Allergies  Allergen Reactions  . Penicillins Anaphylaxis    Has patient had a PCN reaction causing immediate rash, facial/tongue/throat swelling, SOB or lightheadedness with hypotension: Yes Has  patient had a PCN reaction causing severe rash involving mucus membranes or skin necrosis: Yes Has patient had a PCN reaction that required hospitalization: Yes Has patient had a PCN reaction occurring within the last 10 years: No If all of the above answers are "NO", then may proceed with Cephalosporin use.   . Other Other (See Comments)    Medication used prior to colonoscopy- Pt states it feels like "gasoline was poured on him", throat swelling  . Golytely [Peg 3350-Electrolytes] Rash    Social History:   Social History   Socioeconomic History  . Marital status: Widowed    Spouse name: Not on  file  . Number of children: Not on file  . Years of education: Not on file  . Highest education level: Not on file  Occupational History  . Not on file  Social Needs  . Financial resource strain: Not on file  . Food insecurity:    Worry: Not on file    Inability: Not on file  . Transportation needs:    Medical: Not on file    Non-medical: Not on file  Tobacco Use  . Smoking status: Former Smoker    Packs/day: 0.50    Years: 30.00    Pack years: 15.00    Types: Cigarettes    Start date: 12/31/1963    Last attempt to quit: 10/16/2018    Years since quitting: 0.1  . Smokeless tobacco: Never Used  . Tobacco comment: 2 cigarettes per day  Substance and Sexual Activity  . Alcohol use: No    Alcohol/week: 0.0 standard drinks  . Drug use: No  . Sexual activity: Not Currently  Lifestyle  . Physical activity:    Days per week: Not on file    Minutes per session: Not on file  . Stress: Not on file  Relationships  . Social connections:    Talks on phone: Not on file    Gets together: Not on file    Attends religious service: Not on file    Active member of club or organization: Not on file    Attends meetings of clubs or organizations: Not on file    Relationship status: Not on file  . Intimate partner violence:    Fear of current or ex partner: Not on file    Emotionally abused:  Not on file    Physically abused: Not on file    Forced sexual activity: Not on file  Other Topics Concern  . Not on file  Social History Narrative   Disabled/retired, widow   Lives alone     Family History:    Family History  Problem Relation Age of Onset  . Congestive Heart Failure Father   . Hyperlipidemia Father   . Hypertension Father   . Heart disease Father      ROS:  Please see the history of present illness.  General:no colds or fevers, no weight changes Skin:no rashes or ulcers HEENT:no blurred vision, no congestion CV:see HPI PUL:see HPI GI:no diarrhea constipation or melena, no indigestion GU:no hematuria, no dysuria MS:no joint pain, no claudication Neuro:no syncope, no lightheadedness Endo:no diabetes, no thyroid disease  All other ROS reviewed and negative.     Physical Exam/Data:   Vitals:   11/23/18 0428 11/23/18 0555 11/23/18 0754 11/23/18 1243  BP: 111/61  110/66 123/69  Pulse: 68  75 74  Resp: 20 19 20 16   Temp: 98.2 F (36.8 C)  98.6 F (37 C) 98.6 F (37 C)  TempSrc: Oral  Oral Oral  SpO2: 99% 94% 94% 95%  Weight: 67.2 kg     Height:       No intake or output data in the 24 hours ending 11/23/18 1533 Filed Weights   11/21/18 0500 11/22/18 0538 11/23/18 0428  Weight: 73.5 kg 69.5 kg 67.2 kg   Body mass index is 21.26 kg/m.  General:  Well nourished, thin, male in no acute distress HEENT: normal Lymph: no adenopathy Neck: no JVD Endocrine:  No thryomegaly Vascular: No carotid bruits; pedal pulses 2+ bilaterally   Cardiac:  normal S1, S2; RRR; and freq Premature beats no murmur, gallup  rub or click, chest incision healing  Lungs:  clear to auscultation bilaterally, no wheezing, rhonchi or rales  Abd: soft, nontender, no hepatomegaly  Ext: no edema Musculoskeletal:  No deformities, BUE and BLE strength normal and equal Skin: warm and dry  Neuro:  Alert and oriented X 3 MAE follows commands, no focal abnormalities noted Psych:   Normal affect     Relevant CV Studies: Cardiac cath see note 11/04/18  Echo 11/16/18 Study Conclusions  - Left ventricle: The cavity size was normal. Systolic function was   normal. The estimated ejection fraction was in the range of 50%   to 55%. Wall motion was normal; there were no regional wall   motion abnormalities. Features are consistent with a pseudonormal   left ventricular filling pattern, with concomitant abnormal   relaxation and increased filling pressure (grade 2 diastolic   dysfunction). Doppler parameters are consistent with elevated   ventricular end-diastolic filling pressure. - Aortic valve: Trileaflet; severely thickened, severely calcified   leaflets. Cusp separation was reduced. There was mild   regurgitation. Mean gradient (S): 52 mm Hg. Peak gradient (S): 79   mm Hg. Valve area (VTI): 0.63 cm^2. Valve area (Vmax): 0.62 cm^2.   Valve area (Vmean): 0.57 cm^2. - Mitral valve: There was moderate regurgitation. - Right ventricle: Systolic function was normal. - Right atrium: The atrium was normal in size. - Tricuspid valve: There was mild regurgitation. - Pulmonary arteries: Systolic pressure was within the normal   range. - Pericardium, extracardiac: There was no pericardial effusion.  Impressions:  - There is hypokinesis of the basal anteroseptal, mid anteroseptal,   anterior and apical septal walls and of the true apex, overall   LVEF 45-50% as the residual walls are hyperdynamic. No apical   thrombus. Aortic stenosis is severe with mild regurgitation.   Mitral regurgitation is at least moderate, if surgery is planned   for AVR, a TEE should be considered for further evaluation of   mitral regurgitation.  Brief OP note 11/19/18 PROCEDURE:  Procedure(s): AORTIC VALVE REPLACEMENT (AVR) using a 21mm Edwards Inspiris Aortic Valve (N/A) CORONARY ARTERY BYPASS GRAFTING (CABG) x1 using the left internal mammary artery (N/A) TRANSESOPHAGEAL ECHOCARDIOGRAM  (TEE) (N/A)   Laboratory Data:  Chemistry Recent Labs  Lab 11/21/18 0412 11/22/18 0247 11/23/18 0739  NA 134* 135 137  K 4.4 3.8 3.5  CL 101 98 100  CO2 26 27 31   GLUCOSE 125* 103* 115*  BUN 11 12 14   CREATININE 1.28* 0.97 1.08  CALCIUM 8.4* 8.4* 8.5*  GFRNONAA 55* >60 >60  GFRAA >60 >60 >60  ANIONGAP 7 10 6     No results for input(s): PROT, ALBUMIN, AST, ALT, ALKPHOS, BILITOT in the last 168 hours. Hematology Recent Labs  Lab 11/20/18 1523 11/20/18 1525 11/21/18 0412 11/22/18 0247  WBC 9.9  --  10.2 8.6  RBC 3.48*  --  3.43* 3.19*  HGB 10.1* 10.2* 9.8* 9.2*  HCT 32.6* 30.0* 32.2* 29.1*  MCV 93.7  --  93.9 91.2  MCH 29.0  --  28.6 28.8  MCHC 31.0  --  30.4 31.6  RDW 14.8  --  14.5 13.7  PLT 127*  --  123* 97*   Cardiac EnzymesNo results for input(s): TROPONINI in the last 168 hours. No results for input(s): TROPIPOC in the last 168 hours.  BNPNo results for input(s): BNP, PROBNP in the last 168 hours.  DDimer No results for input(s): DDIMER in the last 168 hours.  Radiology/Studies:  Dg  Chest 2 View  Result Date: 11/22/2018 CLINICAL DATA:  Post open heart surgery, post removal of 3 tubes today EXAM: CHEST - 2 VIEW COMPARISON:  11/21/2018 FINDINGS: Epicardial pacing wires are present. Enlargement of cardiac silhouette post median sternotomy and AVR. Atherosclerotic calcification aorta. Pulmonary vascularity normal. Bibasilar atelectasis and tiny bibasilar pleural effusions. Linear subsegmental atelectasis RIGHT upper lobe again seen. Persistent tiny LEFT apical pneumothorax unchanged. Remaining lungs clear. Bones demineralized. IMPRESSION: Bibasilar atelectasis and tiny pleural effusions. Persistent tiny LEFT apex pneumothorax and RIGHT upper lobe subsegmental atelectasis. Electronically Signed   By: Ulyses Southward M.D.   On: 11/22/2018 08:26   Dg Chest Port 1 View  Result Date: 11/21/2018 CLINICAL DATA:  Postoperative evaluation EXAM: PORTABLE CHEST 1 VIEW  COMPARISON:  Chest radiograph 11/20/2018 FINDINGS: Right IJ approach vascular sheath in place. Interval retraction PA catheter. Mediastinal drains in place. Left chest tube in place. Stable cardiac and mediastinal contours status post median sternotomy. Minimal heterogeneous opacities lung bases bilaterally. Persistent tiny left apical pneumothorax. IMPRESSION: Persistent tiny left apical pneumothorax.  Left chest tube in place. Similar-appearing basilar heterogeneous opacities which may represent atelectasis. Electronically Signed   By: Annia Belt M.D.   On: 11/21/2018 08:17   Dg Chest Port 1 View  Result Date: 11/20/2018 CLINICAL DATA:  Status post CABG and aortic valve replacement. Chest tube in place. EXAM: PORTABLE CHEST 1 VIEW COMPARISON:  11/19/2018. FINDINGS: Decreased inspiration with interval mild enlargement of the cardiac silhouette. Mild increase in prominence of the pulmonary vasculature and interstitial markings. Small right pleural effusion and minimal left pleural effusion. Mild increase in patchy density at the left lung base and interval minimal patchy density at the right lung base. Stable median sternotomy wires, prosthetic aortic valve, right jugular Swan-Ganz catheter with its tip overlying the main pulmonary artery or pulmonary outflow tract and mediastinal and left chest tubes. Approximately 5% left apical pneumothorax with little change. Unremarkable bones. IMPRESSION: 1. Stable 5% left apical pneumothorax. 2. Interval mild changes of congestive heart failure. 3. Mild increase in mild patchy atelectasis or pneumonia at the left lung base. 4. Interval minimal patchy atelectasis or pneumonia at the right lung base. Electronically Signed   By: Beckie Salts M.D.   On: 11/20/2018 08:49    Assessment and Plan:   1. AS- severe with s/p AVR with bioprosthesis. Dr. Elease Hashimoto to see.  2. CAD s/p LIMA to LAD.  Residual disease of 40-50% in LCX and RCA.  Treating medically  3. HLD on lipitor  80 mg continue 4. SVT and possible short bursts of PAF. Will add amiodarone 200 mg BID for 2 weeks then 200 mg daily for total of 3 months.   5. HTN controlled 6. Hard of hearing.    7. Hx of anemia and post op anemia      For questions or updates, please contact [facility] Please consult [email or web address] for contact info under     Signed, Nada Boozer, NP  11/23/2018 3:33 PM   Attending Note:   The patient was seen and examined.  Agree with assessment and plan as noted above.  Changes made to the above note as needed.  Patient seen and independently examined with  Nada Boozer, NP .   We discussed all aspects of the encounter. I agree with the assessment and plan as stated above.  1.    CAD :  S/p coronary artery bypass grafting.  He seems to be doing well.  2.  Aortic stenosis: He  is status post aortic valve replacement.  He seems to be doing well.  3.  Paroxysmal atrial fibrillation: Patient is having intermittent episodes of paroxysmal atrial fibrillation.  He is asymptomatic.  The episodes are quite brief and I do not necessarily think that he needs anti-coagulation.  We will try to suppress this with amiodarone.  We will start start with 200 mg twice a day for 2 weeks and then decrease it down to 200 mg a day.  Anticipate stopping the amiodarone after about 3 months.  He will see 1 of our APP is in the office in 3 to 4 weeks.  I will see him in several months.    I have spent a total of 40 minutes with patient reviewing hospital  notes , telemetry, EKGs, labs and examining patient as well as establishing an assessment and plan that was discussed with the patient. > 50% of time was spent in direct patient care.    Vesta Mixer, Montez Hageman., MD, [facility] 11/23/2018, 4:53 PM [address],  [address] Office [phone] Pager [phone]

## 2018-11-23 NOTE — Progress Notes (Signed)
CARDIAC REHAB PHASE I   PRE:  Rate/Rhythm: 98 SR with short burst Atach to 123    BP: sitting 110/72    SaO2: 96 RA  MODE:  Ambulation: 840 ft   POST:  Rate/Rhythm: 110 ST with bursts to 120s    BP: sitting 127/77     SaO2: 97 RA  Pt moving independently, slow steady pace in hall. Feels well. Does not feel his HR increasing. No c/o. To recliner. Ed completed with good reception. Quit smoking 1 month ago and sts he is not going back. Gave resources but he seemed to not want to discuss much. [facility].   [id number]  Harriet MassonRandi Kristan Cervando Durnin CES, ACSM 11/23/2018 9:27 AM

## 2018-11-23 NOTE — Progress Notes (Signed)
  Echocardiogram 2D Echocardiogram has been performed.  Jonathan Holland 11/23/2018, 5:32 PM

## 2018-11-24 MED ORDER — ATORVASTATIN CALCIUM 80 MG PO TABS: 80.0000 mg | ORAL_TABLET | Freq: Every day | ORAL | 1 refills | Status: DC

## 2018-11-24 MED ORDER — POTASSIUM CHLORIDE CRYS ER 20 MEQ PO TBCR
40.0000 meq | EXTENDED_RELEASE_TABLET | Freq: Once | ORAL | Status: AC
  Administered 2018-11-24: 40 meq via ORAL
  Filled 2018-11-24: qty 2

## 2018-11-24 MED ORDER — OXYCODONE HCL 5 MG PO TABS: 5.0000 mg | ORAL_TABLET | Freq: Four times a day (QID) | ORAL | 0 refills | Status: AC | PRN

## 2018-11-24 MED ORDER — AMIODARONE HCL 200 MG PO TABS: 200.0000 mg | ORAL_TABLET | Freq: Two times a day (BID) | ORAL | 1 refills | Status: DC

## 2018-11-24 MED ORDER — METOPROLOL TARTRATE 50 MG PO TABS: 50.0000 mg | ORAL_TABLET | Freq: Two times a day (BID) | ORAL | 1 refills | Status: DC

## 2018-11-24 MED FILL — Perflutren Lipid Microsphere IV Susp 1.1 MG/ML: INTRAVENOUS | Qty: 10 | Status: AC

## 2018-11-24 NOTE — Plan of Care (Signed)
  Problem: Education: Goal: Knowledge of General Education information will improve Description Including pain rating scale, medication(s)/side effects and non-pharmacologic comfort measures Outcome: Adequate for Discharge   Problem: Health Behavior/Discharge Planning: Goal: Ability to manage health-related needs will improve Outcome: Adequate for Discharge   Problem: Clinical Measurements: Goal: Ability to maintain clinical measurements within normal limits will improve Outcome: Adequate for Discharge Goal: Will remain free from infection Outcome: Adequate for Discharge Goal: Diagnostic test results will improve Outcome: Adequate for Discharge Goal: Respiratory complications will improve Outcome: Adequate for Discharge Goal: Cardiovascular complication will be avoided Outcome: Adequate for Discharge   Problem: Coping: Goal: Level of anxiety will decrease Outcome: Adequate for Discharge   Problem: Safety: Goal: Ability to remain free from injury will improve Outcome: Adequate for Discharge   Problem: Education: Goal: Will demonstrate proper wound care and an understanding of methods to prevent future damage Outcome: Adequate for Discharge Goal: Knowledge of disease or condition will improve Outcome: Adequate for Discharge Goal: Knowledge of the prescribed therapeutic regimen will improve Outcome: Adequate for Discharge Goal: Individualized Educational Video(s) Outcome: Adequate for Discharge   Problem: Activity: Goal: Risk for activity intolerance will decrease Outcome: Adequate for Discharge   Problem: Cardiac: Goal: Will achieve and/or maintain hemodynamic stability Outcome: Adequate for Discharge   Problem: Clinical Measurements: Goal: Postoperative complications will be avoided or minimized Outcome: Adequate for Discharge   Problem: Skin Integrity: Goal: Wound healing without signs and symptoms of infection Outcome: Adequate for Discharge Goal: Risk for  impaired skin integrity will decrease Outcome: Adequate for Discharge

## 2018-11-24 NOTE — Progress Notes (Addendum)
[address]       [place] [id number]             [phone]      5 Days Post-Op Procedure(s) (LRB): AORTIC VALVE REPLACEMENT (AVR) using a 21mm Edwards Inspiris Aortic Valve (N/A) CORONARY ARTERY BYPASS GRAFTING (CABG) x1 using the left internal mammary artery (N/A) TRANSESOPHAGEAL ECHOCARDIOGRAM (TEE) (N/A) Subjective: Sore but overall conts to progress well  Objective: Vital signs in last 24 hours: Temp:  [97.5 F (36.4 C)-98.6 F (37 C)] 97.5 F (36.4 C) (11/26 0508) Pulse Rate:  [74-84] 77 (11/26 0508) Cardiac Rhythm: Sinus tachycardia;Supraventricular tachycardia (11/25 1906) Resp:  [16-22] 18 (11/26 0508) BP: (110-123)/(62-69) 116/65 (11/26 0508) SpO2:  [94 %-98 %] 97 % (11/26 0508) Weight:  [67 kg] 67 kg (11/26 0508)  Hemodynamic parameters for last 24 hours:    Intake/Output from previous day: 11/25 0701 - 11/26 0700 In: 385 [P.O.:360; I.V.:25] Out: -  Intake/Output this shift: No intake/output data recorded.  General appearance: alert, cooperative and no distress Heart: regular rate and rhythm and soft systolic murmur Lungs: clear to auscultation bilaterally Abdomen: benign Extremities: no edema Wound: incis healing well  Lab Results: Recent Labs    11/22/18 0247  WBC 8.6  HGB 9.2*  HCT 29.1*  PLT 97*   BMET:  Recent Labs    11/22/18 0247 11/23/18 0739  NA 135 137  K 3.8 3.5  CL 98 100  CO2 27 31  GLUCOSE 103* 115*  BUN 12 14  CREATININE 0.97 1.08  CALCIUM 8.4* 8.5*    PT/INR: No results for input(s): LABPROT, INR in the last 72 hours. ABG    Component Value Date/Time   PHART 7.344 (L) 11/19/2018 1754   HCO3 21.7 11/19/2018 1754   TCO2 27 11/20/2018 1525   ACIDBASEDEF 4.0 (H) 11/19/2018 1754   O2SAT 93.0 11/19/2018 1754   CBG (last 3)  No results for input(s): GLUCAP in the last 72 hours.  Meds Scheduled Meds: . acetaminophen  1,000 mg Oral Q6H  . amiodarone  200 mg Oral BID  . aspirin EC  81 mg  Oral Daily  . atorvastatin  80 mg Oral q1800  . bisacodyl  10 mg Oral Daily   Or  . bisacodyl  10 mg Rectal Daily  . Chlorhexidine Gluconate Cloth  6 each Topical Daily  . docusate sodium  200 mg Oral Daily  . ferrous fumarate-b12-vitamic C-folic acid  1 capsule Oral TID PC  . mouth rinse  15 mL Mouth Rinse BID  . metoprolol tartrate  50 mg Oral BID  . mupirocin ointment  1 application Nasal BID  . pantoprazole  40 mg Oral Daily  . sodium chloride flush  3 mL Intravenous Q12H  . tamsulosin  0.4 mg Oral Daily   Continuous Infusions: . sodium chloride    . sodium chloride    . lactated ringers Stopped (11/21/18 1115)   PRN Meds:.sodium chloride, metoprolol tartrate, ondansetron (ZOFRAN) IV, oxyCODONE, sodium chloride flush, traMADol  Xrays No results found.  Assessment/Plan: S/P Procedure(s) (LRB): AORTIC VALVE REPLACEMENT (AVR) using a 21mm Edwards Inspiris Aortic Valve (N/A) CORONARY ARTERY BYPASS GRAFTING (CABG) x1 using the left internal mammary artery (N/A) TRANSESOPHAGEAL ECHOCARDIOGRAM (TEE) (N/A)   1 conts to do well POD# 5 AVR/CABG 2 some intermit afib, will have to decide on AC RX, amio started PO yest 200 BID replace K+ 3 echo done yesterday 4 hemodyn stable, sats good on RA  5 cardiol f/u arranged for Eva 6 will discuss disposition with MD  LOS: 5 days    Rowe Clack [facility] 11/24/2018 Pager [phone]  I have seen and examined the patient and agree with the assessment and plan as outlined.  Maintaining NSR w/ frequent PAC's and occasional very brief bursts of Afib.  Rate adequately controlled and patient asymptomatic.  Agree w/ addition of amiodarone per Dr Elease Hashimoto.  I do not think systemic anticoagulation should be started due to known h/o recurrent GI bleeding.  Okay for D/C home today.  Purcell Nails, MD 11/24/2018 10:21 AM

## 2018-11-24 NOTE — Progress Notes (Signed)
Chest tube sutures x 3 removed and steri-strips applied per order with benzoin. Mr Jonathan Holland tolerated well without pain and agrees to leave them in place. Reviewed infection prevention and s/s.

## 2018-11-24 NOTE — Progress Notes (Signed)
Discharge instructions reviewed with patient and prescriptions provided. He agrees to all followup appointments and instructions. He is discharged in stable condition with all belongings via wheelchair to family vehicle and currently denies pain.

## 2018-11-24 NOTE — Care Management Note (Signed)
Case Management Note Original Note Created Midge Minium RN, BSN, NCM-BC, ACM-RN [phone] 11/20/2018, 3:51 PM    Patient Details  Name: Jonathan Holland MRN: [id number] Date of Birth: [birth year]  Subjective/Objective:  72yo male with aortic stenosis, s/p AVR, CABG x 1 11/19/18.              Action/Plan: CM met with patient to discuss transitional needs. Patient lived at home alone, independent with ADLs PTA. PCP verified as: London Pepper; [facility] Desma Maxim: [place]-(fax #) [phone] for progress notes and DC summary. CM left a VM with patients CSW at [facility], [name], requesting a return call to discuss POC. Patient verbalized his daughter, who lives in [place], will be available to assist post transition and provide transportation home. CM team will continue to follow.   Expected Discharge Date:  11/24/18               Expected Discharge Plan:  Home/Self Care  In-House Referral:  NA  Discharge planning Services  CM Consult  Post Acute Care Choice:  NA Choice offered to:  NA  DME Arranged:  N/A DME Agency:  NA  HH Arranged:  NA HH Agency:  NA  Status of Service:  Completed, signed off  If discussed at Ward of Stay Meetings, dates discussed:    Discharge Disposition: home/self care   Additional Comments:  11/24/18- 1400- Taleia Sadowski RN, CM- pt for transition home today, no CM needs noted for transition home.   Dahlia Client [place], RN 11/24/2018, 1:57 PM [phone] 4E Transitions of Care RN Case Manager

## 2018-12-03 ENCOUNTER — Telehealth: Payer: Self-pay | Admitting: Cardiovascular Disease

## 2018-12-03 NOTE — Telephone Encounter (Signed)
  Pt c/o swelling: STAT is pt has developed SOB within 24 hours  1) How much weight have you gained and in what time span? no  2) If swelling, where is the swelling located? Ankles and feet  3) Are you currently taking a fluid pill? no  4) Are you currently SOB? no  5) Do you have a log of your daily weights (if so, list)? no  Have you gained 3 pounds in a day or 5 pounds in a week? No  6) Have you traveled recently? No  Patient is starting to have some swelling in feet and ankles. He also stated that he is having some blood draining from his surgical site

## 2018-12-03 NOTE — Telephone Encounter (Signed)
Called patient back. Patient had surgery with Dr. Cornelius Moraswen on 11/19/18. Advised patient to call Dr. Orvan Julywen's office about blood draining from his surgical site and swelling in his ankles. Patient verbalized understanding and will call Dr. Orvan Julywen's office.

## 2018-12-08 NOTE — Progress Notes (Signed)
Cardiology Office Note   Date:  12/09/2018   ID:  Deonte, Otting June 01, 1946, MRN [id number]  PCP:  Farris Has, MD  Cardiologist: Dr. Elease Hashimoto, MD    Chief Complaint  Patient presents with  . Post -Op Open Heart Surgery  . Atrial Fibrillation  . Hypertension  . Aortic Stenosis    History of Present Illness: CALLOWAY ANDRUS is a 72 y.o. male who presents for post hospital follow up for AVR replacement and CABG x1 performed 11/19/18, seen for Dr. Elease Hashimoto.   Mr. Prettyman has a hx of symptomatic AS, HTN, HLD, tobacco use, COPD, chronic anemia and history of GI bleed with AVMs who was admitted for elective AVR and CABG secondary to severe AS and significant diffuse CAD on 11/19/18 with 21 mm Edwards inspiris aortic valve and CABG x1 LIMA to LAD on 11/19/2018.   He was initially  being seen in follow-up by his primary care physician at the [facility] and underwent an echocardiogram that revealed severe aortic stenosis. He was referred to [facility] where he underwent echocardiogram and diagnostic cardiac catheterization on 10/30/2018. A diagnostic cardiac catheterization was performed and demonstrated multivessel coronary artery disease with high-grade 90% stenosis of the mid left anterior descending coronary artery. There is 50% stenosis in the mid right coronary artery and 30 to 50% stenosis in the left circumflex coronary artery. Further work-up for possible surgical aortic valve replacement with coronary artery bypass grafting versus transcatheter aortic valve replacement and PCI was recommended. The patient was unhappy with the care he received in [place] and elected to defer work-up at that time and he was seen by [facility] service for a second opinion on 11/12/2018. He underwent CT angiography, echocardiogram, and routine preoperative blood work with anemia panel. He reported no new problems or complaints and was looking forward to  proceeding with surgery as planned.  He last saw Dr. Elease Hashimoto in consultation 11/23/2017 for post CABG follow-up, requested by Dr. Cornelius Moras.  He was progressing well postoperatively with intermittently uncontrolled hypertension and occasional episodes of SVT/atrial fibrillation. Given this, he was started on metoprolol 50mg  twice daily and PO Amiodarone. He continued to ambulate with cardiac rehab without complication.   He presents today for follow up and to establish cardiac care with [facility] [place]. He is doing very well. He denies chest pain, SOB, palpitations, diaphoresis, dizziness, orthopnea, or syncope. He has had very mild LE swelling that is relieved with leg elevation. He has gained a few pounds since discharge however states that this is mainly because his appetite and diet are much better. He states that he has been walking approximately 0.5-1 miles per day with the exception of inclement  weather days. He states he some bleeding at the surgical site, but thinks that the is related to coughing and "stretching" the incision areas. There is no oozing. He reports hearing a "poping" sound with inspiration, however on exam this appears to be the prosthetic valve that he is not used to hearing quite yet. We discussed this and that with time, this will seem to dissipate.   Past Medical History:  Diagnosis Date  . Anemia   . Arthritis   . Atherosclerosis of aorta (HCC) 03/26/2016  . Chronic pain   . COPD (chronic obstructive pulmonary disease) (HCC)   . Dyspnea    NOW HE HAS SOB DURING EXCERCISE  . GERD (gastroesophageal reflux disease)   . Heart murmur   .  Heart murmur 03/26/2016  . Hyperlipidemia   . Hyperlipidemia with target LDL less than 70 03/26/2016  . Hypertension   . PTSD (post-traumatic stress disorder)   . S/P aortic valve replacement with bioprosthetic valve 11/19/2018   21 mm Edwards Inspiris Resilia stented bovine pericardial tissue valve  . S/P CABG x 1 11/19/2018   LIMA to LAD   . SOB (shortness of breath)   . Tobacco abuse 03/26/2016    Past Surgical History:  Procedure Laterality Date  .  tonsilectomy as a child    . ADENOIDECTOMY    . AORTIC VALVE REPLACEMENT N/A 11/19/2018   Procedure: AORTIC VALVE REPLACEMENT (AVR) using a 21mm Edwards Inspiris Aortic Valve;  Surgeon: Purcell Nails, MD;  Location: [facility] OR;  Service: Open Heart Surgery;  Laterality: N/A;  . CARDIAC CATHETERIZATION  10/29/2018   done at [facility]  . CORONARY ARTERY BYPASS GRAFT N/A 11/19/2018   Procedure: CORONARY ARTERY BYPASS GRAFTING (CABG) x1 using the left internal mammary artery;  Surgeon: Purcell Nails, MD;  Location: [facility] OR;  Service: Open Heart Surgery;  Laterality: N/A;  . HERNIA REPAIR    . SHOULDER ARTHROSCOPY    . TEE WITHOUT CARDIOVERSION N/A 11/19/2018   Procedure: TRANSESOPHAGEAL ECHOCARDIOGRAM (TEE);  Surgeon: Purcell Nails, MD;  Location: [facility] OR;  Service: Open Heart Surgery;  Laterality: N/A;  . TONSILLECTOMY       Current Outpatient Medications  Medication Sig Dispense Refill  . albuterol (PROVENTIL HFA;VENTOLIN HFA) 108 (90 Base) MCG/ACT inhaler Inhale 2 puffs into the lungs every 4 (four) hours as needed for wheezing or shortness of breath.    Marland Kitchen aspirin EC 81 MG tablet Take 81 mg by mouth daily.     Marland Kitchen atorvastatin (LIPITOR) 80 MG tablet Take 1 tablet (80 mg total) by mouth daily at 6 PM. 30 tablet 1  . ferrous sulfate 325 (65 FE) MG tablet Take 325 mg by mouth See admin instructions. Twice daily with meals on Monday, Wednesday, Friday    . metoprolol tartrate (LOPRESSOR) 50 MG tablet Take 1 tablet (50 mg total) by mouth 2 (two) times daily. 60 tablet 1  . Multiple Vitamin (MULTIVITAMIN) tablet Take 1 tablet by mouth daily.    . pantoprazole (PROTONIX) 40 MG tablet Take 40 mg by mouth daily as needed (heartburn).     . tamsulosin (FLOMAX) 0.4 MG CAPS capsule Take 0.4 mg by mouth daily.     . vitamin B-12 (CYANOCOBALAMIN) 1000 MCG tablet Take 1,000 mcg by  mouth daily.     . vitamin C (ASCORBIC ACID) 500 MG tablet Take 500 mg by mouth daily.    Marland Kitchen amiodarone (PACERONE) 200 MG tablet Take 1 tablet (200 mg total) by mouth daily. 90 tablet 3   No current facility-administered medications for this visit.     Allergies:   Penicillins; Golytely [peg 3350-electrolytes]; and Other    Social History:  The patient  reports that he quit smoking about 7 weeks ago. His smoking use included cigarettes. He started smoking about 54 years ago. He has a 15.00 pack-year smoking history. He has never used smokeless tobacco. He reports that he does not drink alcohol or use drugs.   Family History:  The patient's family history includes Congestive Heart Failure in his father; Heart disease in his father; Hyperlipidemia in his father; Hypertension in his father.    ROS:  Please see the history of present illness. Otherwise, review of systems are positive for none. All  other systems are reviewed and negative.    PHYSICAL EXAM: VS:  BP 140/64   Pulse (!) 58   Ht 5\' 10"  (1.778 m)   Wt 163 lb 12.8 oz (74.3 kg)   SpO2 95%   BMI 23.50 kg/m  , BMI Body mass index is 23.5 kg/m.  General: Well developed, well nourished, NAD Skin: Warm, dry, intact  Head: Normocephalic, atraumatic, clear, moist mucus membranes. Neck: Negative for carotid bruits. No JVD Lungs:Clear to ausculation bilaterally. No wheezes, rales, or rhonchi. Breathing is unlabored. Cardiovascular: RRR with S1 S2. No murmurs, rubs, gallops, or LV heave appreciated. MSK: Strength and tone appear normal for age. 5/5 in all extremities Extremities: Trace BLE edema. No clubbing or cyanosis. DP/PT pulses 2+ bilaterally Neuro: Alert and oriented. No focal deficits. No facial asymmetry. MAE spontaneously. Psych: Responds to questions appropriately with normal affect.     EKG:  EKG is ordered today. The ekg ordered today demonstrates Sinus bradycardia with HR 55   Recent Labs: 11/16/2018: ALT  12 11/22/2018: Hemoglobin 9.2; Platelets 97 11/23/2018: BUN 14; Creatinine, Ser 1.08; Magnesium 2.1; Potassium 3.5; Sodium 137    Lipid Panel No results found for: CHOL, TRIG, HDL, CHOLHDL, VLDL, LDLCALC, LDLDIRECT    Wt Readings from Last 3 Encounters:  12/09/18 163 lb 12.8 oz (74.3 kg)  11/24/18 147 lb 11.3 oz (67 kg)  11/18/18 156 lb (70.8 kg)     Other studies Reviewed: Additional studies/ records that were reviewed today include:   Echo 11/16/18 Study Conclusions  - Left ventricle: The cavity size was normal. Systolic function was normal. The estimated ejection fraction was in the range of 50% to 55%. Wall motion was normal; there were no regional wall motion abnormalities. Features are consistent with a pseudonormal left ventricular filling pattern, with concomitant abnormal relaxation and increased filling pressure (grade 2 diastolic dysfunction). Doppler parameters are consistent with elevated ventricular end-diastolic filling pressure. - Aortic valve: Trileaflet; severely thickened, severely calcified leaflets. Cusp separation was reduced. There was mild regurgitation. Mean gradient (S): 52 mm Hg. Peak gradient (S): 79 mm Hg. Valve area (VTI): 0.63 cm^2. Valve area (Vmax): 0.62 cm^2. Valve area (Vmean): 0.57 cm^2. - Mitral valve: There was moderate regurgitation. - Right ventricle: Systolic function was normal. - Right atrium: The atrium was normal in size. - Tricuspid valve: There was mild regurgitation. - Pulmonary arteries: Systolic pressure was within the normal range. - Pericardium, extracardiac: There was no pericardial effusion.  Impressions:  - There is hypokinesis of the basal anteroseptal, mid anteroseptal, anterior and apical septal walls and of the true apex, overall LVEF 45-50% as the residual walls are hyperdynamic. No apical thrombus. Aortic stenosis is severe with mild regurgitation. Mitral regurgitation is at  least moderate, if surgery is planned for AVR, a TEE should be considered for further evaluation of mitral regurgitation.  Brief OP note 11/19/18 PROCEDURE:Procedure(s): AORTIC VALVE REPLACEMENT (AVR) using a 21mm Edwards Inspiris Aortic Valve (N/A) CORONARY ARTERY BYPASS GRAFTING (CABG) x1 using the left internal mammary artery (N/A) TRANSESOPHAGEAL ECHOCARDIOGRAM (TEE) (N/A)  Cath at [facility] 10/29/18:  Left Anterior Descending  Mid LAD lesion is 90% stenosed. The lesion is calcified.  Left Circumflex  Prox Cx lesion is 40% stenosed.  Mid Cx lesion is 50% stenosed.  Right Coronary Artery  Mid RCA lesion is 50% stenosed. The lesion is calcified.  Right Posterior Descending Artery  RPDA lesion is 50% stenosed.   RHC: RA 12, RV 36/18, PA 40/24, Wedge 26.   ASSESSMENT  AND PLAN:  1.  Aortic stenosis s/p AVR with bioprosthesis: -Pt with AVR on 11/19/18 per Dr. Cornelius Moraswen, discharged on 11/24/18 -Asymptomatic, patient doing well -Surgical site unremarkable, no redness or oozing today  -Weight noted to be 163lb, however pt states that this weight was with winter jacket and other clothing on. Discharge weight was 147.8lb -Appears euvolemic on exam, CTA -Will obtain BMET and CBC today  -If he continues to have c/o mild swelling not resolved with elevation, consider adding low dose Lasix 20mg  daily or changing to losartan/hctz combo -Discussed sternal precautions, pt understands  -To see Dr. Cornelius Moraswen for post-operative follow up 12/23  2.  CAD s/p LIMA to LAD with residual disease of 40 to 50% in LCX and RCA with recommendations for medical treatment: -No anginal symptoms -Currently no formally participating in cardiac rehab, although he is walking 0.5-1.0 mile per day with plans to increase this further  -Sternal precautions reviewed  -Follow-up with [facility] 12/21/18 -Continue ASA 81, atorvastatin 80, metoprolol 50  3.  Paroxysmal atrial fibrillation: -Pt had intermittent episodes of  asymptomatic paroxysmal atrial fibrillation with PAC's and short bursts of SVT during his hospitalization. Episodes were quite brief with no recommendations for anticoagulation at that time in the setting of AF duration and known history of GI bleeding due to AV malformations. -Hemoglobin from 11/22/2018 was 9.2>>repeat today  -He was started on amiodarone to try to suppress episodes, starting at 200 mg twice daily for 2 weeks then decrease down to 200 mg/day approximately 3 months in duration -Pt has been taking 200mg  twice daily>>>will have him decrease today to 200mg  daily until seen by Dr. Elease HashimotoNahser in 01/2019 -If EKG with SB with HR 55>>>HR should increase slightly with the decrease in Amio    4.  HLD: -Stable, continue atorvastatin 80 mg daily -Last LDL on file from 2017, 144 -Will have him obtain a fasting lipid panel at [facility] as this is patient preference   5.  HTN:  -Stable, 140/64 today  -Continue metoprolol 50 -If he continues to have SB at next follow up visit that has not resolved with the decrease in Amio, consider medication adjustment at that time    Current medicines are reviewed at length with the patient today.  The patient does not have concerns regarding medicines.  The following changes have been made:  Decrease Amiodarone to 200mg  daily until next follow up in 01/2019  Labs/ tests ordered today include: CBC, BMET today. Lipid and LFT's before next visit in 01/2019  Orders Placed This Encounter  Procedures  . CBC  . Basic metabolic panel  . Hepatic function panel  . Lipid panel  . EKG 12-Lead   Disposition:   FU with Dr. Elease HashimotoNahser in 2 months  Signed, Georgie ChardJill , NP  12/09/2018 12:47 PM    [facility] [address], [place], [place]  [address] Phone: [phone]; Fax: [phone]

## 2018-12-09 ENCOUNTER — Ambulatory Visit: Payer: Medicare HMO | Admitting: Cardiology

## 2018-12-09 ENCOUNTER — Encounter: Payer: Self-pay | Admitting: Cardiology

## 2018-12-09 VITALS — BP 140/64 | HR 58 | Ht 70.0 in | Wt 163.8 lb

## 2018-12-09 DIAGNOSIS — I10 Essential (primary) hypertension: Secondary | ICD-10-CM

## 2018-12-09 DIAGNOSIS — Z953 Presence of xenogenic heart valve: Secondary | ICD-10-CM

## 2018-12-09 DIAGNOSIS — Z951 Presence of aortocoronary bypass graft: Secondary | ICD-10-CM | POA: Diagnosis not present

## 2018-12-09 DIAGNOSIS — I480 Paroxysmal atrial fibrillation: Secondary | ICD-10-CM | POA: Diagnosis not present

## 2018-12-09 DIAGNOSIS — E785 Hyperlipidemia, unspecified: Secondary | ICD-10-CM | POA: Diagnosis not present

## 2018-12-09 DIAGNOSIS — I25118 Atherosclerotic heart disease of native coronary artery with other forms of angina pectoris: Secondary | ICD-10-CM

## 2018-12-09 DIAGNOSIS — I350 Nonrheumatic aortic (valve) stenosis: Secondary | ICD-10-CM | POA: Diagnosis not present

## 2018-12-09 DIAGNOSIS — J449 Chronic obstructive pulmonary disease, unspecified: Secondary | ICD-10-CM | POA: Diagnosis not present

## 2018-12-09 DIAGNOSIS — I700 Atherosclerosis of aorta: Secondary | ICD-10-CM

## 2018-12-09 MED ORDER — AMIODARONE HCL 200 MG PO TABS: 200.0000 mg | ORAL_TABLET | Freq: Every day | ORAL | 3 refills | Status: DC

## 2018-12-09 NOTE — Patient Instructions (Addendum)
Medication Instructions:  Your physician has recommended you make the following change in your medication:  1. DECREASE AMIODARONE TO 200 MG ONCE DAILY   If you need a refill on your cardiac medications before your next appointment, please call your pharmacy.   Lab work: TODAY: BMET, CBC  LABS TO BE DONE IN 2 MONTHS: LFTS AND LIPIDS  If you have labs (blood work) drawn today and your tests are completely normal, you will receive your results only by: Marland Kitchen. MyChart Message (if you have MyChart) OR . A paper copy in the mail If you have any lab test that is abnormal or we need to change your treatment, we will call you to review the results.  Testing/Procedures: None ordered  Follow-Up: At [facility], you and your health needs are our priority.  As part of our continuing mission to provide you with exceptional heart care, we have created designated Provider Care Teams.  These Care Teams include your primary Cardiologist (physician) and Advanced Practice Providers (APPs -  Physician Assistants and Nurse Practitioners) who all work together to provide you with the care you need, when you need it. . You will need a follow up appointment in 2 MONTHS.  You may see DR Elease HashimotoNAHSER or one of the following Advanced Practice Providers on your designated Care Team.     Any Other Special Instructions Will Be Listed Below (If Applicable).

## 2018-12-10 LAB — CBC
HEMATOCRIT: 33 % — AB (ref 37.5–51.0)
Hemoglobin: 10.6 g/dL — ABNORMAL LOW (ref 13.0–17.7)
MCH: 28 pg (ref 26.6–33.0)
MCHC: 32.1 g/dL (ref 31.5–35.7)
MCV: 87 fL (ref 79–97)
Platelets: 262 10*3/uL (ref 150–450)
RBC: 3.78 x10E6/uL — AB (ref 4.14–5.80)
RDW: 12.9 % (ref 12.3–15.4)
WBC: 4 10*3/uL (ref 3.4–10.8)

## 2018-12-10 LAB — BASIC METABOLIC PANEL
BUN / CREAT RATIO: 4 — AB (ref 10–24)
BUN: 5 mg/dL — AB (ref 8–27)
CALCIUM: 9.2 mg/dL (ref 8.6–10.2)
CHLORIDE: 99 mmol/L (ref 96–106)
CO2: 27 mmol/L (ref 20–29)
Creatinine, Ser: 1.13 mg/dL (ref 0.76–1.27)
GFR, EST AFRICAN AMERICAN: 76 mL/min/{1.73_m2} (ref >59)
GFR, EST NON AFRICAN AMERICAN: 65 mL/min/{1.73_m2} (ref >59)
GLUCOSE: 92 mg/dL (ref 65–99)
Potassium: 4.5 mmol/L (ref 3.5–5.2)
SODIUM: 140 mmol/L (ref 134–144)

## 2018-12-18 ENCOUNTER — Other Ambulatory Visit: Payer: Self-pay | Admitting: Thoracic Surgery (Cardiothoracic Vascular Surgery)

## 2018-12-18 DIAGNOSIS — Z951 Presence of aortocoronary bypass graft: Secondary | ICD-10-CM

## 2018-12-21 ENCOUNTER — Encounter: Payer: Self-pay | Admitting: Physician Assistant

## 2018-12-21 ENCOUNTER — Ambulatory Visit
Admission: RE | Admit: 2018-12-21 | Discharge: 2018-12-21 | Disposition: A | Discharge status: Home / Self Care | Payer: Medicare HMO | Source: Ambulatory Visit | Attending: Thoracic Surgery (Cardiothoracic Vascular Surgery) | Admitting: Thoracic Surgery (Cardiothoracic Vascular Surgery)

## 2018-12-21 ENCOUNTER — Ambulatory Visit (INDEPENDENT_AMBULATORY_CARE_PROVIDER_SITE_OTHER): Payer: Self-pay | Admitting: Physician Assistant

## 2018-12-21 ENCOUNTER — Other Ambulatory Visit: Payer: Self-pay

## 2018-12-21 VITALS — BP 154/65 | HR 58 | Resp 18 | Ht 70.0 in | Wt 151.6 lb

## 2018-12-21 DIAGNOSIS — Z951 Presence of aortocoronary bypass graft: Secondary | ICD-10-CM

## 2018-12-21 DIAGNOSIS — Z952 Presence of prosthetic heart valve: Secondary | ICD-10-CM

## 2018-12-21 DIAGNOSIS — I712 Thoracic aortic aneurysm, without rupture, unspecified: Secondary | ICD-10-CM

## 2018-12-21 DIAGNOSIS — I25119 Atherosclerotic heart disease of native coronary artery with unspecified angina pectoris: Secondary | ICD-10-CM

## 2018-12-21 DIAGNOSIS — I350 Nonrheumatic aortic (valve) stenosis: Secondary | ICD-10-CM

## 2018-12-21 DIAGNOSIS — J90 Pleural effusion, not elsewhere classified: Secondary | ICD-10-CM | POA: Diagnosis not present

## 2018-12-21 MED ORDER — FUROSEMIDE 40 MG PO TABS: 40.0000 mg | ORAL_TABLET | Freq: Every day | ORAL | 0 refills | Status: DC

## 2018-12-21 MED ORDER — LISINOPRIL 10 MG PO TABS: 10.0000 mg | ORAL_TABLET | Freq: Every day | ORAL | 1 refills | Status: DC

## 2018-12-21 MED ORDER — METOPROLOL TARTRATE 25 MG PO TABS: 25.0000 mg | ORAL_TABLET | Freq: Two times a day (BID) | ORAL | 1 refills | Status: DC

## 2018-12-21 NOTE — Progress Notes (Signed)
HPI:  Patient returns for routine postoperative follow-up having undergone an aortic valve replacement and CABG x 1  By Dr. Cornelius Holland on 11/19/2018. The patient's early postoperative recovery while in the hospital was notable for PACs and possible short runs of a fib and or NSVT. He was put on Amiodarone and his Lopressor was increased. Anticoagulation was to be avoided secondary to his high risk of GI bleeding  Since hospital discharge the patient reports, he walks about 1 mile 2-3 times per day. He is not smoking. He denies chest pain or shortness of breath.   Current Outpatient Medications  Medication Sig Dispense Refill  . albuterol (PROVENTIL HFA;VENTOLIN HFA) 108 (90 Base) MCG/ACT inhaler Inhale 2 puffs into the lungs every 4 (four) hours as needed for wheezing or shortness of breath.    Marland Kitchen. amiodarone (PACERONE) 200 MG tablet Take 1 tablet (200 mg total) by mouth daily. 90 tablet 3  . aspirin EC 81 MG tablet Take 81 mg by mouth daily.     Marland Kitchen. atorvastatin (LIPITOR) 80 MG tablet Take 1 tablet (80 mg total) by mouth daily at 6 PM. 30 tablet 1  . ferrous sulfate 325 (65 FE) MG tablet Take 325 mg by mouth See admin instructions. Twice daily with meals on Monday, Wednesday, Friday    . metoprolol tartrate (LOPRESSOR) 50 MG tablet Take 1 tablet (50 mg total) by mouth 2 (two) times daily. 60 tablet 1  . Multiple Vitamin (MULTIVITAMIN) tablet Take 1 tablet by mouth daily.    . pantoprazole (PROTONIX) 40 MG tablet Take 40 mg by mouth daily as needed (heartburn).     . tamsulosin (FLOMAX) 0.4 MG CAPS capsule Take 0.4 mg by mouth daily.     . vitamin B-12 (CYANOCOBALAMIN) 1000 MCG tablet Take 1,000 mcg by mouth daily.     . vitamin C (ASCORBIC ACID) 500 MG tablet Take 500 mg by mouth daily.    Vital Signs: BP 154/65, HR 58, RR 18, Oxygenation 97% on room air   Physical Exam: CV-RRR Pulmonary-Clear on the right and slightly diminished left base Abdomen-soft, non tender, bowel sounds  present Extremities-No LE edema Wound-Clean and dry  Diagnostic Tests: CLINICAL DATA:  72 year old male status post cardiac surgery in November.  EXAM: CHEST - 2 VIEW  COMPARISON:  11/22/2018 and earlier.  FINDINGS: A small right pleural effusion has resolved since November but a small layering left pleural effusion has progressed.  No superimposed pneumothorax, pulmonary edema or other confluent pulmonary opacity. Mild left apical calcified scarring redemonstrated.   Sequelae of valve replacement and sternotomy. Mediastinal contours are within normal limits. Visualized tracheal air column is within normal limits.  No acute osseous abnormality identified. Negative visible bowel gas pattern.  IMPRESSION: 1. Small left pleural effusion has progressed since November. 2. Small right pleural effusion has resolved, and there is no other acute cardiopulmonary abnormality.   Electronically Signed   By: Jonathan Holland M.D.   On: 12/21/2018 13:42   Impression and Plan: Overall, Jonathan Holland is recovering well from CABG x 1 nd aortic valve replacement. He has already seen the NP from cardiology on 12/11. EKG done at that time showed sinus bradycardia. He is bradycardic on today's visit. I have decreased Lopressor to 25 mg bid. Per cardiology, continue Amiodarone 200 mg daily for now. Because I decreased his Lopressor and his systolic BP is in the 150's, I started him on low dose Lisinopril as well. On his chest x ray today, the previously seen  right pleural effusion has resolved but now he has an increase in the left pleural effusion. I have given him a prescription for Lasix 40 mg daily for 5 days. He is not taking Oxy anymore for pain and his already driving. He does not wish to participate in cardiac rehab at this time. He was instructed to continue with sternal precautions (no lifting more than 10 pounds)  for the next 2-3 weeks. He will return in 1-2 weeks with a chest xray. He was  instructed to contact us sooner if he has any problems.    Jonathan Ballsonielle M Cintya Daughety, PA-C [facility] [phone]

## 2018-12-21 NOTE — Patient Instructions (Signed)
You may return to driving an automobile as long as you are no longer requiring oral narcotic pain relievers during the daytime.  It would be wise to start driving only short distances during the daylight and gradually increase from there as you feel comfortable.  You are encouraged to enroll and participate in the outpatient cardiac rehab program beginning as soon as practical.  You may continue to gradually increase your physical activity as tolerated.  Refrain from any heavy lifting or strenuous use of your arms and shoulders until at least 8 weeks from the time of your surgery, and avoid activities that cause increased pain in your chest on the side of your surgical incision.  Otherwise you may continue to increase activities without any particular limitations.  Increase the intensity and duration of physical activity gradually. 

## 2018-12-31 ENCOUNTER — Telehealth: Payer: Self-pay | Admitting: *Deleted

## 2018-12-31 NOTE — Telephone Encounter (Signed)
Jonathan Holland has called twice regarding a reaction he had after starting the Lisinopril that D. Joycelyn Man, PA-C had prescribed for him on 12/21/18 at his last office visit.  He said that it made him so lethargic with difficulty walking.  We had advised him to stop it late last week. Since then he has had no other problems.  He called to see if we wanted to make any other changes.  He has since bought a BP machine and his readings are averaging in the 120/50 range with the pulse in the 50's.  In discussing the change in dosage she had made in the Metoprolol, he has been taking 50 in the am and 25 in the pm.. I corrected this and told him to take 25 mg am and pm.  He agreed.  We will see him back as scheduled to f/u his pleural effusions.

## 2019-01-04 ENCOUNTER — Other Ambulatory Visit: Payer: Self-pay | Admitting: Thoracic Surgery (Cardiothoracic Vascular Surgery)

## 2019-01-04 DIAGNOSIS — Z951 Presence of aortocoronary bypass graft: Secondary | ICD-10-CM

## 2019-01-05 ENCOUNTER — Ambulatory Visit
Admission: RE | Admit: 2019-01-05 | Discharge: 2019-01-05 | Disposition: A | Discharge status: Home / Self Care | Payer: Medicare HMO | Source: Ambulatory Visit | Attending: Thoracic Surgery (Cardiothoracic Vascular Surgery) | Admitting: Thoracic Surgery (Cardiothoracic Vascular Surgery)

## 2019-01-05 ENCOUNTER — Other Ambulatory Visit: Payer: Self-pay

## 2019-01-05 ENCOUNTER — Ambulatory Visit (INDEPENDENT_AMBULATORY_CARE_PROVIDER_SITE_OTHER): Payer: Self-pay | Admitting: Physician Assistant

## 2019-01-05 VITALS — BP 136/61 | HR 63 | Resp 15 | Ht 70.0 in | Wt 147.0 lb

## 2019-01-05 DIAGNOSIS — I350 Nonrheumatic aortic (valve) stenosis: Secondary | ICD-10-CM

## 2019-01-05 DIAGNOSIS — I2510 Atherosclerotic heart disease of native coronary artery without angina pectoris: Secondary | ICD-10-CM

## 2019-01-05 DIAGNOSIS — J449 Chronic obstructive pulmonary disease, unspecified: Secondary | ICD-10-CM | POA: Diagnosis not present

## 2019-01-05 DIAGNOSIS — J90 Pleural effusion, not elsewhere classified: Secondary | ICD-10-CM | POA: Diagnosis not present

## 2019-01-05 DIAGNOSIS — Z952 Presence of prosthetic heart valve: Secondary | ICD-10-CM

## 2019-01-05 DIAGNOSIS — Z951 Presence of aortocoronary bypass graft: Secondary | ICD-10-CM

## 2019-01-05 DIAGNOSIS — Z8679 Personal history of other diseases of the circulatory system: Secondary | ICD-10-CM

## 2019-01-05 NOTE — Progress Notes (Signed)
HPI: Patient returns for routine postoperative follow-up having undergone aortic valve replacement and single-vessel coronary bypass grafting by Dr. Cornelius Moras on 11/19/18. The patient's early postoperative recovery while in the hospital was notable for frequent PACs and possible paroxysmal atrial fibrillation.  He was eventually started on amiodarone resulting in stabilization to normal sinus rhythm.  At his last postop visit on 12/21/2018, he was noted to have bradycardia with heart rate in the mid 50s and systolic blood pressure in the mid 150s.  For this reason, his metoprolol was decreased to 25 mg p.o. twice daily and low-dose lisinopril was added for improved blood pressure control.  The patient encountered severe fatigue making ambulation difficult after starting the lisinopril and stopped it on his own after taking it for approximately 6 days.  Since then, he has gradually gained strength and endurance.  He denies any chest pain except for some mild soreness at the right upper sternal border that only occurs when he turns his head into certain positions and is very brief.  He denies shortness of breath.  He denies palpitations.  Overall, he is very pleased with his progress and improved exercise tolerance since having the surgery.    Current Outpatient Medications  Medication Sig Dispense Refill  . albuterol (PROVENTIL HFA;VENTOLIN HFA) 108 (90 Base) MCG/ACT inhaler Inhale 2 puffs into the lungs every 4 (four) hours as needed for wheezing or shortness of breath.    Marland Kitchen amiodarone (PACERONE) 200 MG tablet Take 1 tablet (200 mg total) by mouth daily. 90 tablet 3  . aspirin EC 81 MG tablet Take 81 mg by mouth daily.     Marland Kitchen atorvastatin (LIPITOR) 80 MG tablet Take 1 tablet (80 mg total) by mouth daily at 6 PM. 30 tablet 1  . ferrous sulfate 325 (65 FE) MG tablet Take 325 mg by mouth See admin instructions. Twice daily with meals on Monday, Wednesday, Friday    . furosemide (LASIX) 40 MG tablet Take 1  tablet (40 mg total) by mouth daily. 5 tablet 0  . lisinopril (PRINIVIL,ZESTRIL) 10 MG tablet Take 1 tablet (10 mg total) by mouth daily. 30 tablet 1  . metoprolol tartrate (LOPRESSOR) 25 MG tablet Take 1 tablet (25 mg total) by mouth 2 (two) times daily. 60 tablet 1  . Multiple Vitamin (MULTIVITAMIN) tablet Take 1 tablet by mouth daily.    Marland Kitchen oxyCODONE (ROXICODONE) 15 MG immediate release tablet Take 15 mg by mouth every 8 (eight) hours as needed for pain.    . pantoprazole (PROTONIX) 40 MG tablet Take 40 mg by mouth daily as needed (heartburn).     . tamsulosin (FLOMAX) 0.4 MG CAPS capsule Take 0.4 mg by mouth daily.     . vitamin B-12 (CYANOCOBALAMIN) 1000 MCG tablet Take 1,000 mcg by mouth daily.     . vitamin C (ASCORBIC ACID) 500 MG tablet Take 500 mg by mouth daily.     No current facility-administered medications for this visit.    Vital signs:  BP 136/61   Pulse 63   Respirations 15   O2 saturation 97% on room air           Physical Exam:  Heart: Regular rate and rhythm.  Soft systolic murmur expected with his pericardial tissue prosthetic valve.  Chest: Breath sounds are clear to auscultation.  Sternotomy incision is well-healed and stable.  Extremities: no edema   Diagnostic Tests:  EXAM: CHEST - 2 VIEW  COMPARISON:  12/21/2018  FINDINGS: Small pleural effusions left  greater than right, improved since previous. Lungs clear.  Heart size normal. Previous median sternotomy and AVR. Aortic Atherosclerosis (ICD10-170.0).  No pneumothorax.  IMPRESSION: Small pleural effusions, improved since previous exam.   Electronically Signed   By: Corlis Leak M.D.   On: 01/05/2019 13:35  Impression / Plan: Mr. Hrabak is approximately 7 weeks post aortic valve placement and single-vessel coronary bypass grafting for severe aortic stenosis and 90% stenosis of the mid LAD.  He is making excellent progress postoperatively with his recovery and he is very pleased with his  improved exercise endurance.  Lisinopril initiated at the last visit for mild hypertension has been discontinued by the patient due to intolerance but he continues on metoprolol at 25 mg p.o. twice daily and his blood pressure is satisfactorily controlled.  He continues on amiodarone 200 mg p.o. twice daily for postoperative PACs and PAF.  This can be discontinued at the discretion of cardiology since he has had no evidence of recurrence since his discharge home..  All other medications are reviewed and no changes are necessary from CT surgery standpoint.  He has not smoked since prior to surgery and is strongly encouraged to continue to avoid tobacco products and he is in strong agreement.   No further follow-up is scheduled but he is encouraged to contact us if we may be of any further assistance with his care.        Leary Roca, PA-C [facility] [phone]

## 2019-01-05 NOTE — Patient Instructions (Signed)
Avoid lifting greater than 15lbs for another 4 weeks.

## 2019-01-20 ENCOUNTER — Encounter: Payer: Self-pay | Admitting: Physician Assistant

## 2019-01-20 ENCOUNTER — Ambulatory Visit (INDEPENDENT_AMBULATORY_CARE_PROVIDER_SITE_OTHER): Payer: No Typology Code available for payment source | Admitting: Physician Assistant

## 2019-01-20 VITALS — BP 126/66 | HR 59 | Ht 70.0 in | Wt 151.1 lb

## 2019-01-20 DIAGNOSIS — I10 Essential (primary) hypertension: Secondary | ICD-10-CM

## 2019-01-20 DIAGNOSIS — I739 Peripheral vascular disease, unspecified: Secondary | ICD-10-CM

## 2019-01-20 DIAGNOSIS — J449 Chronic obstructive pulmonary disease, unspecified: Secondary | ICD-10-CM | POA: Diagnosis not present

## 2019-01-20 DIAGNOSIS — Z952 Presence of prosthetic heart valve: Secondary | ICD-10-CM | POA: Diagnosis not present

## 2019-01-20 DIAGNOSIS — I9789 Other postprocedural complications and disorders of the circulatory system, not elsewhere classified: Secondary | ICD-10-CM | POA: Diagnosis not present

## 2019-01-20 DIAGNOSIS — E785 Hyperlipidemia, unspecified: Secondary | ICD-10-CM

## 2019-01-20 DIAGNOSIS — I2510 Atherosclerotic heart disease of native coronary artery without angina pectoris: Secondary | ICD-10-CM | POA: Diagnosis not present

## 2019-01-20 DIAGNOSIS — I4891 Unspecified atrial fibrillation: Secondary | ICD-10-CM

## 2019-01-20 DIAGNOSIS — I779 Disorder of arteries and arterioles, unspecified: Secondary | ICD-10-CM

## 2019-01-20 MED ORDER — METOPROLOL TARTRATE 25 MG PO TABS: 25.0000 mg | ORAL_TABLET | Freq: Two times a day (BID) | ORAL | 3 refills | Status: DC

## 2019-01-20 NOTE — Patient Instructions (Signed)
Medication Instructions:  No changes.  See your medication list.  If you need a refill on your cardiac medications before your next appointment, please call your pharmacy.   Lab work: None today.  Testing/Procedures: None today.  Follow-Up: Follow up with your cardiologist at the [facility] in February 2020 as planned. Call for follow up with Dr. Elease Hashimoto at [facility] as needed.   Any Other Special Instructions Will Be Listed Below (If Applicable). Discuss the following with your cardiologist at the [facility]: - Consider stopping Amiodarone at follow up in February if you remain in normal sinus rhythm. - Arrange follow up carotid ultrasound in 10/2019 to recheck carotid stenosis. - Consider repeat echocardiogram in 1-2 years. - Get fasting Lipids in February 2020 (goal LDL < 70). - You need antibiotics whenever you have dental work done to prevent infection on your new valve.

## 2019-01-20 NOTE — Progress Notes (Signed)
Cardiology Office Note:    Date:  01/20/2019   ID:  Jonathan Holland, Jonathan Holland 1946-06-22, MRN [id number]  PCP:  Farris Has, MD  Cardiologist:  Kristeen Miss, MD   Electrophysiologist:  None   Referring MD: Farris Has, MD   Chief Complaint  Patient presents with  . Follow-up    CAD, hx of CABG/AVR     History of Present Illness:    Jonathan Holland is a 73 y.o. 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  He was admitted in November 2019 and underwent bioprosthetic aortic valve replacement and single-vessel CABG with LIMA-LAD.  Postoperative course was complicated by atrial fibrillation.  He was not placed on anticoagulation.  He was placed on amiodarone for rhythm control.  Plan was to continue this for 3 months postop.  He was last seen in clinic in in 11/2018 by Jonathan Chard, NP.  His beta-blocker was adjusted for bradycardia at his post op visit with [facility].  He was placed on Lisinopril for blood pressure but had to stop this due to side effects.     Jonathan Holland returns for follow-up.  Since last seen, he has done well.  He is walking 3 to 4 miles a day.  He has not had significant shortness of breath.  He denies significant chest soreness.  He has not had syncope, paroxysmal nocturnal dyspnea or lower extremity swelling.  Prior CV studies:   The following studies were reviewed today:  Echo 11/23/2018 EF 60-65, normal wall motion, grade 2 diastolic dysfunction, normally functioning AVR, mean gradient 9, trivial MR, mild LAE, normal RV SF, trivial TR, trivial pericardial effusion  Carotid US 11/16/2018 Bilateral ICA 40-59  Cardiac catheterization 10/29/2018 [facility]) LAD mid 90 LCx proximal 40, mid 50 RCA mid 69; RPDA 63  Past Medical History:  Diagnosis Date  . Anemia   . Arthritis   . Atherosclerosis of aorta (HCC) 03/26/2016  .  Chronic pain   . COPD (chronic obstructive pulmonary disease) (HCC)   . Dyspnea    NOW HE HAS SOB DURING EXCERCISE  . GERD (gastroesophageal reflux disease)   . Heart murmur   . Heart murmur 03/26/2016  . Hyperlipidemia   . Hyperlipidemia with target LDL less than 70 03/26/2016  . Hypertension   . PTSD (post-traumatic stress disorder)   . S/P aortic valve replacement with bioprosthetic valve 11/19/2018   21 mm Edwards Inspiris Resilia stented bovine pericardial tissue valve  . 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 SOB (shortness of breath)   . Tobacco abuse 03/26/2016   Surgical Hx: The patient  has a past surgical history that includes  tonsilectomy as a child; Hernia repair; Adenoidectomy; Shoulder arthroscopy; Cardiac catheterization (10/29/2018); Tonsillectomy; Aortic valve replacement (N/A, 11/19/2018); Coronary artery bypass graft (N/A, 11/19/2018); and TEE without cardioversion (N/A, 11/19/2018).   Current Medications: Current Meds  Medication Sig  . albuterol (PROVENTIL HFA;VENTOLIN HFA) 108 (90 Base) MCG/ACT inhaler Inhale 2 puffs into the lungs every 4 (four) hours as needed for wheezing or shortness of breath.  Marland Kitchen amiodarone (PACERONE) 200 MG tablet Take 1 tablet (200 mg total) by mouth daily.  Marland Kitchen aspirin EC 81 MG tablet Take 81 mg by mouth daily.   Marland Kitchen atorvastatin (LIPITOR) 80 MG tablet Take 1 tablet (80 mg total) by mouth daily at 6 PM.  . ferrous sulfate 325 (65 FE)  MG tablet Take 325 mg by mouth See admin instructions. Twice daily with meals on Monday, Wednesday, Friday  . furosemide (LASIX) 40 MG tablet Take 1 tablet (40 mg total) by mouth daily.  . metoprolol tartrate (LOPRESSOR) 25 MG tablet Take 1 tablet (25 mg total) by mouth 2 (two) times daily.  . Multiple Vitamin (MULTIVITAMIN) tablet Take 1 tablet by mouth daily.  Marland Kitchen. oxyCODONE (ROXICODONE) 15 MG immediate release tablet Take 15 mg by mouth every 8 (eight) hours as needed for pain.  . pantoprazole (PROTONIX) 40 MG  tablet Take 40 mg by mouth daily as needed (heartburn).   . tamsulosin (FLOMAX) 0.4 MG CAPS capsule Take 0.4 mg by mouth daily.   . vitamin B-12 (CYANOCOBALAMIN) 1000 MCG tablet Take 1,000 mcg by mouth daily.   . vitamin C (ASCORBIC ACID) 500 MG tablet Take 500 mg by mouth daily.  . [DISCONTINUED] lisinopril (PRINIVIL,ZESTRIL) 10 MG tablet Take 1 tablet (10 mg total) by mouth daily.  . [DISCONTINUED] metoprolol tartrate (LOPRESSOR) 25 MG tablet Take 1 tablet (25 mg total) by mouth 2 (two) times daily.  . [DISCONTINUED] metoprolol tartrate (LOPRESSOR) 25 MG tablet Take 1 tablet (25 mg total) by mouth 2 (two) times daily.     Allergies:   Penicillins; Golytely [peg 3350-electrolytes]; and Other   Social History   Tobacco Use  . Smoking status: Former Smoker    Packs/day: 0.50    Years: 30.00    Pack years: 15.00    Types: Cigarettes    Start date: 12/31/1963    Last attempt to quit: 10/16/2018    Years since quitting: 0.2  . Smokeless tobacco: Never Used  . Tobacco comment: 2 cigarettes per day  Substance Use Topics  . Alcohol use: No    Alcohol/week: 0.0 standard drinks  . Drug use: No     Family Hx: The patient's family history includes Congestive Heart Failure in his father; Heart disease in his father; Hyperlipidemia in his father; Hypertension in his father.  ROS:   Please see the history of present illness.    ROS All other systems reviewed and are negative.   EKGs/Labs/Other Test Reviewed:    EKG:  EKG is  ordered today.  The ekg ordered today demonstrates sinus bradycardia, heart rate 59, normal axis, QTC 441  Recent Labs: 11/16/2018: ALT 12 11/23/2018: Magnesium 2.1 12/09/2018: BUN 5; Creatinine, Ser 1.13; Hemoglobin 10.6; Platelets 262; Potassium 4.5; Sodium 140   Recent Lipid Panel No results found for: CHOL, TRIG, HDL, CHOLHDL, LDLCALC, LDLDIRECT  Physical Exam:    VS:  BP 126/66   Pulse (!) 59   Ht 5\' 10"  (1.778 m)   Wt 151 lb 1.9 oz (68.5 kg)   SpO2  97%   BMI 21.68 kg/m     Wt Readings from Last 3 Encounters:  01/20/19 151 lb 1.9 oz (68.5 kg)  01/05/19 147 lb (66.7 kg)  12/21/18 151 lb 9.6 oz (68.8 kg)     Physical Exam  Constitutional: He is oriented to person, place, and time. He appears well-developed and well-nourished. No distress.  HENT:  Head: Normocephalic and atraumatic.  Eyes: No scleral icterus.  Neck: Neck supple. No JVD present. No thyromegaly present.  Cardiovascular: Normal rate, regular rhythm, S1 normal and S2 normal.  No murmur heard. Pulmonary/Chest: Breath sounds normal. He has no rales.  rhonchorus breath sounds throughout  Abdominal: Soft. There is no hepatomegaly.  Musculoskeletal:        General: No edema.  Lymphadenopathy:    He has no cervical adenopathy.  Neurological: He is alert and oriented to person, place, and time.  Skin: Skin is warm and dry.  Psychiatric: He has a normal mood and affect.    ASSESSMENT & PLAN:    Coronary artery disease involving native coronary artery of native heart without angina pectoris  Status post single-vessel CABG in November 2019.  He did have moderate nonobstructive disease in the LCx and RCA.  He denies anginal symptoms.  Continue aspirin, statin, beta-blocker.  He plans to follow-up with cardiology at the [facility] in [place].  He can follow-up here with Dr. Elease Hashimoto as needed.  S/P AVR (aortic valve replacement) He is recovering nicely.  Follow-up echocardiogram postoperatively demonstrated normally functioning aortic valve replacement.  We discussed the importance of antibiotics prior to dental work.  Postoperative atrial fibrillation (HCC) Maintaining normal sinus rhythm.  When he sees his cardiologist at the [place] in [place] in February, his amiodarone can be stopped.    Essential hypertension The patient's blood pressure is controlled on his current regimen.  Continue current therapy.   Hyperlipidemia with target LDL less than 70 - He will need  follow-up lipids with his cardiologist at the [facility] in February.  Continue high-dose statin therapy.  Carotid artery disease He will need follow-up carotid ultrasound arranged with cardiology at the [facility] in November 2020.   Dispo:  Return follow up as needed with Dr. Elease Hashimoto.   Medication Adjustments/Labs and Tests Ordered: Current medicines are reviewed at length with the patient today.  Concerns regarding medicines are outlined above.  Tests Ordered: Orders Placed This Encounter  Procedures  . EKG 12-Lead   Medication Changes: Meds ordered this encounter  Medications  . DISCONTD: metoprolol tartrate (LOPRESSOR) 25 MG tablet    Sig: Take 1 tablet (25 mg total) by mouth 2 (two) times daily.    Dispense:  180 tablet    Refill:  3  . metoprolol tartrate (LOPRESSOR) 25 MG tablet    Sig: Take 1 tablet (25 mg total) by mouth 2 (two) times daily.    Dispense:  180 tablet    Refill:  3    Signed, Tereso Newcomer, PA-C  01/20/2019 4:54 PM    [facility] [address] [place], [place], [place]  [address] Phone: [phone]; Fax: [phone]

## 2019-01-28 ENCOUNTER — Encounter: Payer: Self-pay | Admitting: Cardiovascular Disease

## 2019-01-29 ENCOUNTER — Telehealth: Payer: Self-pay | Admitting: Physician Assistant

## 2019-01-29 NOTE — Telephone Encounter (Signed)
New message     Pt stated that he needs PA scott Alben Spittle to fax all documents to his heart doc DR. Lorelle Gibbs at [facility].PT gave address [address] [facility] [place], [place] [address].fax # [phone]. Phone # [phone] ext [phone].

## 2019-02-02 NOTE — Telephone Encounter (Signed)
I have faxed note to: Lorelle Gibbs, PA-C at [facility] at fax # [phone].  Please call to make sure it was received (Phone # [phone] ext [phone]). Tereso Newcomer, PA-C    02/02/2019 11:25 AM

## 2019-02-02 NOTE — Telephone Encounter (Signed)
Attempted to reach [facility] but there was no answer. Will try again later.

## 2019-02-22 ENCOUNTER — Encounter: Payer: Self-pay | Admitting: Cardiovascular Disease

## 2019-02-22 ENCOUNTER — Ambulatory Visit (INDEPENDENT_AMBULATORY_CARE_PROVIDER_SITE_OTHER): Payer: No Typology Code available for payment source | Admitting: Cardiovascular Disease

## 2019-02-22 ENCOUNTER — Encounter (INDEPENDENT_AMBULATORY_CARE_PROVIDER_SITE_OTHER): Payer: Self-pay

## 2019-02-22 VITALS — BP 146/68 | HR 58 | Ht 71.0 in | Wt 150.0 lb

## 2019-02-22 DIAGNOSIS — I2510 Atherosclerotic heart disease of native coronary artery without angina pectoris: Secondary | ICD-10-CM | POA: Diagnosis not present

## 2019-02-22 DIAGNOSIS — Z952 Presence of prosthetic heart valve: Secondary | ICD-10-CM

## 2019-02-22 DIAGNOSIS — I10 Essential (primary) hypertension: Secondary | ICD-10-CM | POA: Diagnosis not present

## 2019-02-22 MED ORDER — LOSARTAN POTASSIUM 50 MG PO TABS: 50.0000 mg | ORAL_TABLET | Freq: Every day | ORAL | 3 refills | Status: DC

## 2019-02-22 NOTE — Patient Instructions (Signed)
Medication Instructions:  Your physician has recommended you make the following change in your medication:  START Losartan (Cozaar) 50 mg once daily  If you need a refill on your cardiac medications before your next appointment, please call your pharmacy.   Lab work: Your physician recommends that you return for lab work in: 3 weeks for basic metabolic panel   Testing/Procedures: None Ordered    Follow-Up: At [facility], you and your health needs are our priority.  As part of our continuing mission to provide you with exceptional heart care, we have created designated Provider Care Teams.  These Care Teams include your primary Cardiologist (physician) and Advanced Practice Providers (APPs -  Physician Assistants and Nurse Practitioners) who all work together to provide you with the care you need, when you need it. You will need a follow up appointment in:  3 months. You may see Kristeen Miss, MD or one of the following Advanced Practice Providers on your designated Care Team: Tereso Newcomer, PA-C [place], [place] . Berton Bon, NP

## 2019-02-22 NOTE — Progress Notes (Signed)
Cardiology Office Note:    Date:  02/22/2019   ID:  Jonathan Holland 10/07/1946, MRN [id number]  PCP:  Farris Has, MD  Cardiologist:  Kristeen Miss, MD  Electrophysiologist:  None   Referring MD: Farris Has, MD   Chief Complaint  Patient presents with  . Coronary Artery Disease  . Aortic Stenosis     Feb. 24, 2020   Jonathan Holland is a 73 y.o. male with a hx of coronary artery disease and aortic stenosis.  He status post aortic valve replacement and coronary artery bypass grafting in Nov. 2019  Seen back today for follow up  Has lots of chest wall tenderness when he reaches or shuts the car door .   Past Medical History:  Diagnosis Date  . Anemia   . Arthritis   . Atherosclerosis of aorta (HCC) 03/26/2016  . Chronic pain   . COPD (chronic obstructive pulmonary disease) (HCC)   . Dyspnea    NOW HE HAS SOB DURING EXCERCISE  . GERD (gastroesophageal reflux disease)   . Heart murmur   . Heart murmur 03/26/2016  . Hyperlipidemia   . Hyperlipidemia with target LDL less than 70 03/26/2016  . Hypertension   . PTSD (post-traumatic stress disorder)   . S/P aortic valve replacement with bioprosthetic valve 11/19/2018   21 mm Edwards Inspiris Resilia stented bovine pericardial tissue valve  . 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 SOB (shortness of breath)   . Tobacco abuse 03/26/2016    Past Surgical History:  Procedure Laterality Date  .  tonsilectomy as a child    . ADENOIDECTOMY    . AORTIC VALVE REPLACEMENT N/A 11/19/2018   Procedure: AORTIC VALVE REPLACEMENT (AVR) using a 90mm Edwards Inspiris Aortic Valve;  Surgeon: Purcell Nails, MD;  Location: [facility] OR;  Service: Open Heart Surgery;  Laterality: N/A;  . CARDIAC CATHETERIZATION  10/29/2018   done at [facility]  . CORONARY ARTERY BYPASS GRAFT N/A 11/19/2018   Procedure: CORONARY ARTERY BYPASS GRAFTING (CABG) x1 using the left internal mammary artery;  Surgeon: Purcell Nails, MD;  Location: [facility]  OR;  Service: Open Heart Surgery;  Laterality: N/A;  . HERNIA REPAIR    . SHOULDER ARTHROSCOPY    . TEE WITHOUT CARDIOVERSION N/A 11/19/2018   Procedure: TRANSESOPHAGEAL ECHOCARDIOGRAM (TEE);  Surgeon: Purcell Nails, MD;  Location: [facility] OR;  Service: Open Heart Surgery;  Laterality: N/A;  . TONSILLECTOMY      Current Medications: Current Meds  Medication Sig  . albuterol (PROVENTIL HFA;VENTOLIN HFA) 108 (90 Base) MCG/ACT inhaler Inhale 2 puffs into the lungs every 4 (four) hours as needed for wheezing or shortness of breath.  Marland Kitchen amiodarone (PACERONE) 200 MG tablet Take 1 tablet (200 mg total) by mouth daily.  Marland Kitchen aspirin EC 81 MG tablet Take 81 mg by mouth daily.   Marland Kitchen atorvastatin (LIPITOR) 80 MG tablet Take 1 tablet (80 mg total) by mouth daily at 6 PM.  . ferrous sulfate 325 (65 FE) MG tablet Take 325 mg by mouth See admin instructions. Twice daily with meals on Monday, Wednesday, Friday  . furosemide (LASIX) 40 MG tablet Take 1 tablet (40 mg total) by mouth daily.  . metoprolol tartrate (LOPRESSOR) 25 MG tablet Take 1 tablet (25 mg total) by mouth 2 (two) times daily.  . Multiple Vitamin (MULTIVITAMIN) tablet Take 1 tablet by mouth daily.  Marland Kitchen oxyCODONE (ROXICODONE) 15 MG immediate release tablet Take 15 mg  by mouth every 8 (eight) hours as needed for pain.  . pantoprazole (PROTONIX) 40 MG tablet Take 40 mg by mouth daily as needed (heartburn).   . tamsulosin (FLOMAX) 0.4 MG CAPS capsule Take 0.4 mg by mouth daily.   . vitamin B-12 (CYANOCOBALAMIN) 1000 MCG tablet Take 1,000 mcg by mouth daily.   . vitamin C (ASCORBIC ACID) 500 MG tablet Take 500 mg by mouth daily.     Allergies:   Penicillins; Golytely [peg 3350-electrolytes]; and Other   Social History   Socioeconomic History  . Marital status: Widowed    Spouse name: Not on file  . Number of children: Not on file  . Years of education: Not on file  . Highest education level: Not on file  Occupational History  . Not on file    Social Needs  . Financial resource strain: Not on file  . Food insecurity:    Worry: Not on file    Inability: Not on file  . Transportation needs:    Medical: Not on file    Non-medical: Not on file  Tobacco Use  . Smoking status: Former Smoker    Packs/day: 0.50    Years: 30.00    Pack years: 15.00    Types: Cigarettes    Start date: 12/31/1963    Last attempt to quit: 10/16/2018    Years since quitting: 0.3  . Smokeless tobacco: Never Used  . Tobacco comment: 2 cigarettes per day  Substance and Sexual Activity  . Alcohol use: No    Alcohol/week: 0.0 standard drinks  . Drug use: No  . Sexual activity: Not Currently  Lifestyle  . Physical activity:    Days per week: Not on file    Minutes per session: Not on file  . Stress: Not on file  Relationships  . Social connections:    Talks on phone: Not on file    Gets together: Not on file    Attends religious service: Not on file    Active member of club or organization: Not on file    Attends meetings of clubs or organizations: Not on file    Relationship status: Not on file  Other Topics Concern  . Not on file  Social History Narrative   Disabled/retired, widow   Lives alone      Family History: The patient's family history includes Congestive Heart Failure in his father; Heart disease in his father; Hyperlipidemia in his father; Hypertension in his father.  ROS:   Please see the history of present illness.     All other systems reviewed and are negative.  EKGs/Labs/Other Studies Reviewed:    The following studies were reviewed today:   EKG:    Recent Labs: 11/16/2018: ALT 12 11/23/2018: Magnesium 2.1 12/09/2018: BUN 5; Creatinine, Ser 1.13; Hemoglobin 10.6; Platelets 262; Potassium 4.5; Sodium 140  Recent Lipid Panel No results found for: CHOL, TRIG, HDL, CHOLHDL, VLDL, LDLCALC, LDLDIRECT  Physical Exam:    VS:  BP (!) 146/68   Pulse (!) 58   Ht 5\' 11"  (1.803 m)   Wt 150 lb (68 kg)   SpO2 98%    BMI 20.92 kg/m     Wt Readings from Last 3 Encounters:  02/22/19 150 lb (68 kg)  01/20/19 151 lb 1.9 oz (68.5 kg)  01/05/19 147 lb (66.7 kg)     GEN: elderly male.   NAD  HEENT: Normal NECK: No JVD; No carotid bruits LYMPHATICS: No lymphadenopathy CARDIAC: RRR, no murmurs,  rubs, gallops RESPIRATORY:  Clear to auscultation without rales, wheezing or rhonchi  ABDOMEN: Soft, non-tender, non-distended MUSCULOSKELETAL:  No edema; No deformity  SKIN: Warm and dry NEUROLOGIC:  Alert and oriented x 3 PSYCHIATRIC:  Normal affect   ASSESSMENT:    1. Essential hypertension   2. Coronary artery disease involving native coronary artery of native heart without angina pectoris   3. S/P AVR (aortic valve replacement)    PLAN:    In order of problems listed above:  1. CAD :    No angina  Seems to be doing very well.  Is not had having any angina.   2. AVR-   S/p AVR  - bioprosthetic AVR   3.  HTN: We will start losartan 50 mg a day.  We will print the prescription so that he can take it to the [place].  He would like to be followed at the [place].  We will see if he can get plugged into the cardiology clinic.  If he does then we can see him on an as-needed basis.   Medication Adjustments/Labs and Tests Ordered: Current medicines are reviewed at length with the patient today.  Concerns regarding medicines are outlined above.  Orders Placed This Encounter  Procedures  . Basic Metabolic Panel (BMET)   Meds ordered this encounter  Medications  . losartan (COZAAR) 50 MG tablet    Sig: Take 1 tablet (50 mg total) by mouth daily.    Dispense:  90 tablet    Refill:  3    Patient Instructions  Medication Instructions:  Your physician has recommended you make the following change in your medication:  START Losartan (Cozaar) 50 mg once daily  If you need a refill on your cardiac medications before your next appointment, please call your pharmacy.   Lab work: Your physician recommends that  you return for lab work in: 3 weeks for basic metabolic panel   Testing/Procedures: None Ordered    Follow-Up: At [facility], you and your health needs are our priority.  As part of our continuing mission to provide you with exceptional heart care, we have created designated Provider Care Teams.  These Care Teams include your primary Cardiologist (physician) and Advanced Practice Providers (APPs -  Physician Assistants and Nurse Practitioners) who all work together to provide you with the care you need, when you need it. You will need a follow up appointment in:  3 months. You may see Kristeen Miss, MD or one of the following Advanced Practice Providers on your designated Care Team: Tereso Newcomer, PA-C Vin Benton, [place] . Berton Bon, NP      Signed, Kristeen Miss, MD  02/22/2019 6:16 PM    [facility]

## 2019-03-09 NOTE — Telephone Encounter (Signed)
Still unable to reach [facility]. Will try again later

## 2019-03-15 ENCOUNTER — Other Ambulatory Visit: Payer: Self-pay

## 2019-03-15 ENCOUNTER — Other Ambulatory Visit: Payer: Medicare HMO | Admitting: *Deleted

## 2019-03-15 DIAGNOSIS — I2510 Atherosclerotic heart disease of native coronary artery without angina pectoris: Secondary | ICD-10-CM | POA: Diagnosis not present

## 2019-03-15 DIAGNOSIS — Z952 Presence of prosthetic heart valve: Secondary | ICD-10-CM | POA: Diagnosis not present

## 2019-03-15 DIAGNOSIS — I10 Essential (primary) hypertension: Secondary | ICD-10-CM

## 2019-03-16 LAB — BASIC METABOLIC PANEL
BUN/Creatinine Ratio: 6 — ABNORMAL LOW (ref 10–24)
BUN: 10 mg/dL (ref 8–27)
CHLORIDE: 104 mmol/L (ref 96–106)
CO2: 24 mmol/L (ref 20–29)
Calcium: 9.1 mg/dL (ref 8.6–10.2)
Creatinine, Ser: 1.56 mg/dL — ABNORMAL HIGH (ref 0.76–1.27)
GFR calc Af Amer: 51 mL/min/{1.73_m2} — ABNORMAL LOW (ref >59)
GFR calc non Af Amer: 44 mL/min/{1.73_m2} — ABNORMAL LOW (ref >59)
GLUCOSE: 103 mg/dL — AB (ref 65–99)
Potassium: 4.5 mmol/L (ref 3.5–5.2)
SODIUM: 141 mmol/L (ref 134–144)

## 2019-03-17 ENCOUNTER — Telehealth: Payer: Self-pay | Admitting: Nurse Practitioner

## 2019-03-17 DIAGNOSIS — I10 Essential (primary) hypertension: Secondary | ICD-10-CM

## 2019-03-17 DIAGNOSIS — I2510 Atherosclerotic heart disease of native coronary artery without angina pectoris: Secondary | ICD-10-CM

## 2019-03-17 DIAGNOSIS — Z952 Presence of prosthetic heart valve: Secondary | ICD-10-CM

## 2019-03-17 MED ORDER — AMLODIPINE BESYLATE 5 MG PO TABS: 5.0000 mg | ORAL_TABLET | Freq: Every day | ORAL | 3 refills | Status: DC

## 2019-03-17 NOTE — Telephone Encounter (Signed)
Spoke with patient and reviewed lab results. Patient states he has been taking losartan 25 mg daily. He admits that he drinks almost no water daily, only soda. I advised him to increase water consumption with a goal of 64 oz daily. He states he does not like the taste of water but will work on drinking more. He asks if he can start with watered-down iced tea and I confirmed that this is better for him than soda. I advised patient to stop losartan and he agrees to start amlodipine 5 mg daily. He is aware that I am faxing Rx to [facility]. He reports BP right now at home is 127/65 mmHg after taking losartan. He is scheduled for repeat bmet on 4/16. I advised him to call back with questions or concerns and he thanked me for the call.

## 2019-03-17 NOTE — Telephone Encounter (Signed)
-----   Message from Vesta Mixer, MD sent at 03/17/2019 10:07 AM EDT ----- Creatinine is up since we started the Losartan DC Losartan Start Amlodipine 5 mg a day . We should recheck BMP in about a month

## 2019-04-15 ENCOUNTER — Other Ambulatory Visit: Payer: Medicare HMO

## 2019-04-18 IMAGING — DX DG CHEST 2V
2 series · 2 of 2 positions shown · non-contrast
Comparison: 11/22/2018 and earlier.

CLINICAL DATA: 72-year-old male status post cardiac surgery in
[REDACTED].

EXAM:
CHEST - 2 VIEW

[dg chest 2 view (1 of 2)]
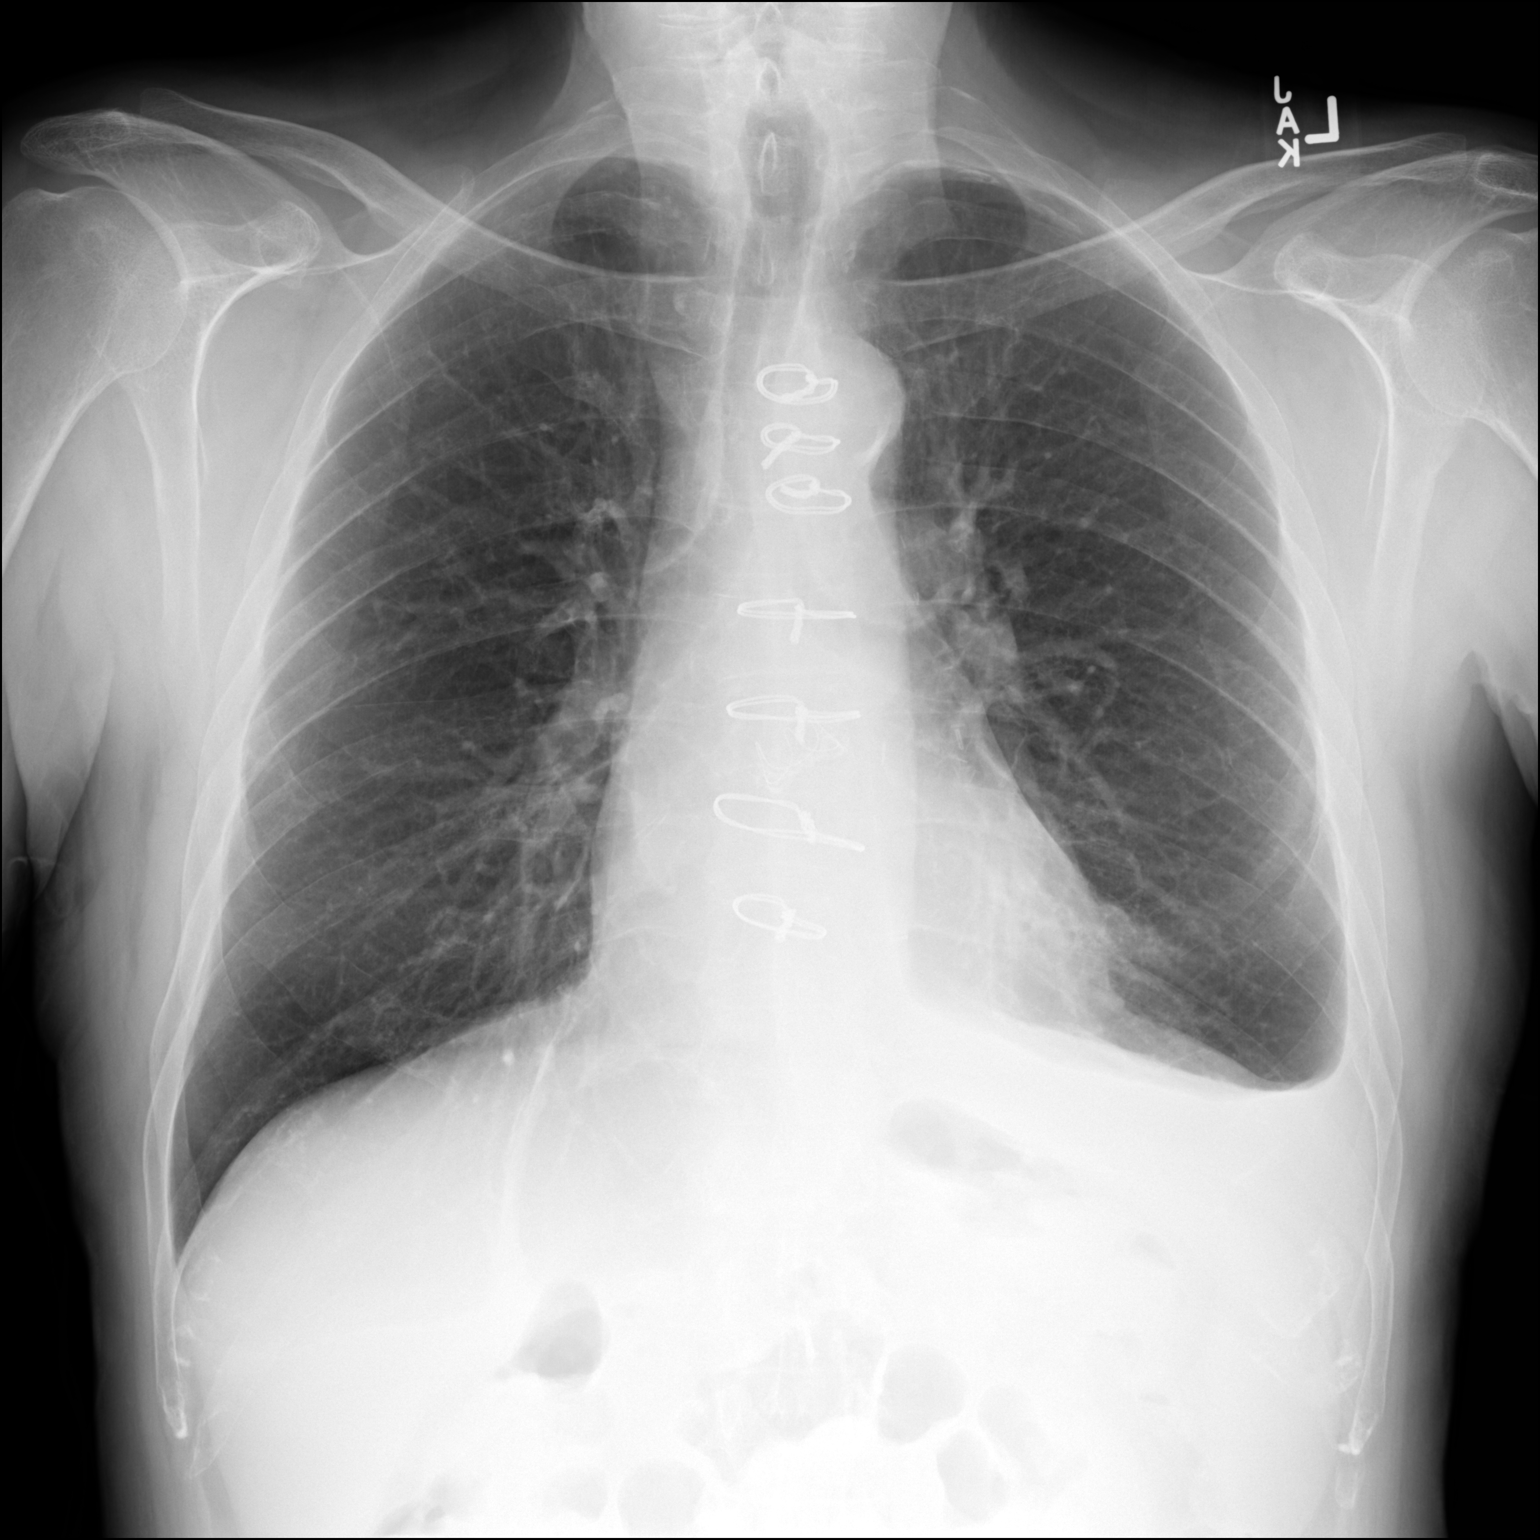

[dg chest 2 view (2 of 2)]
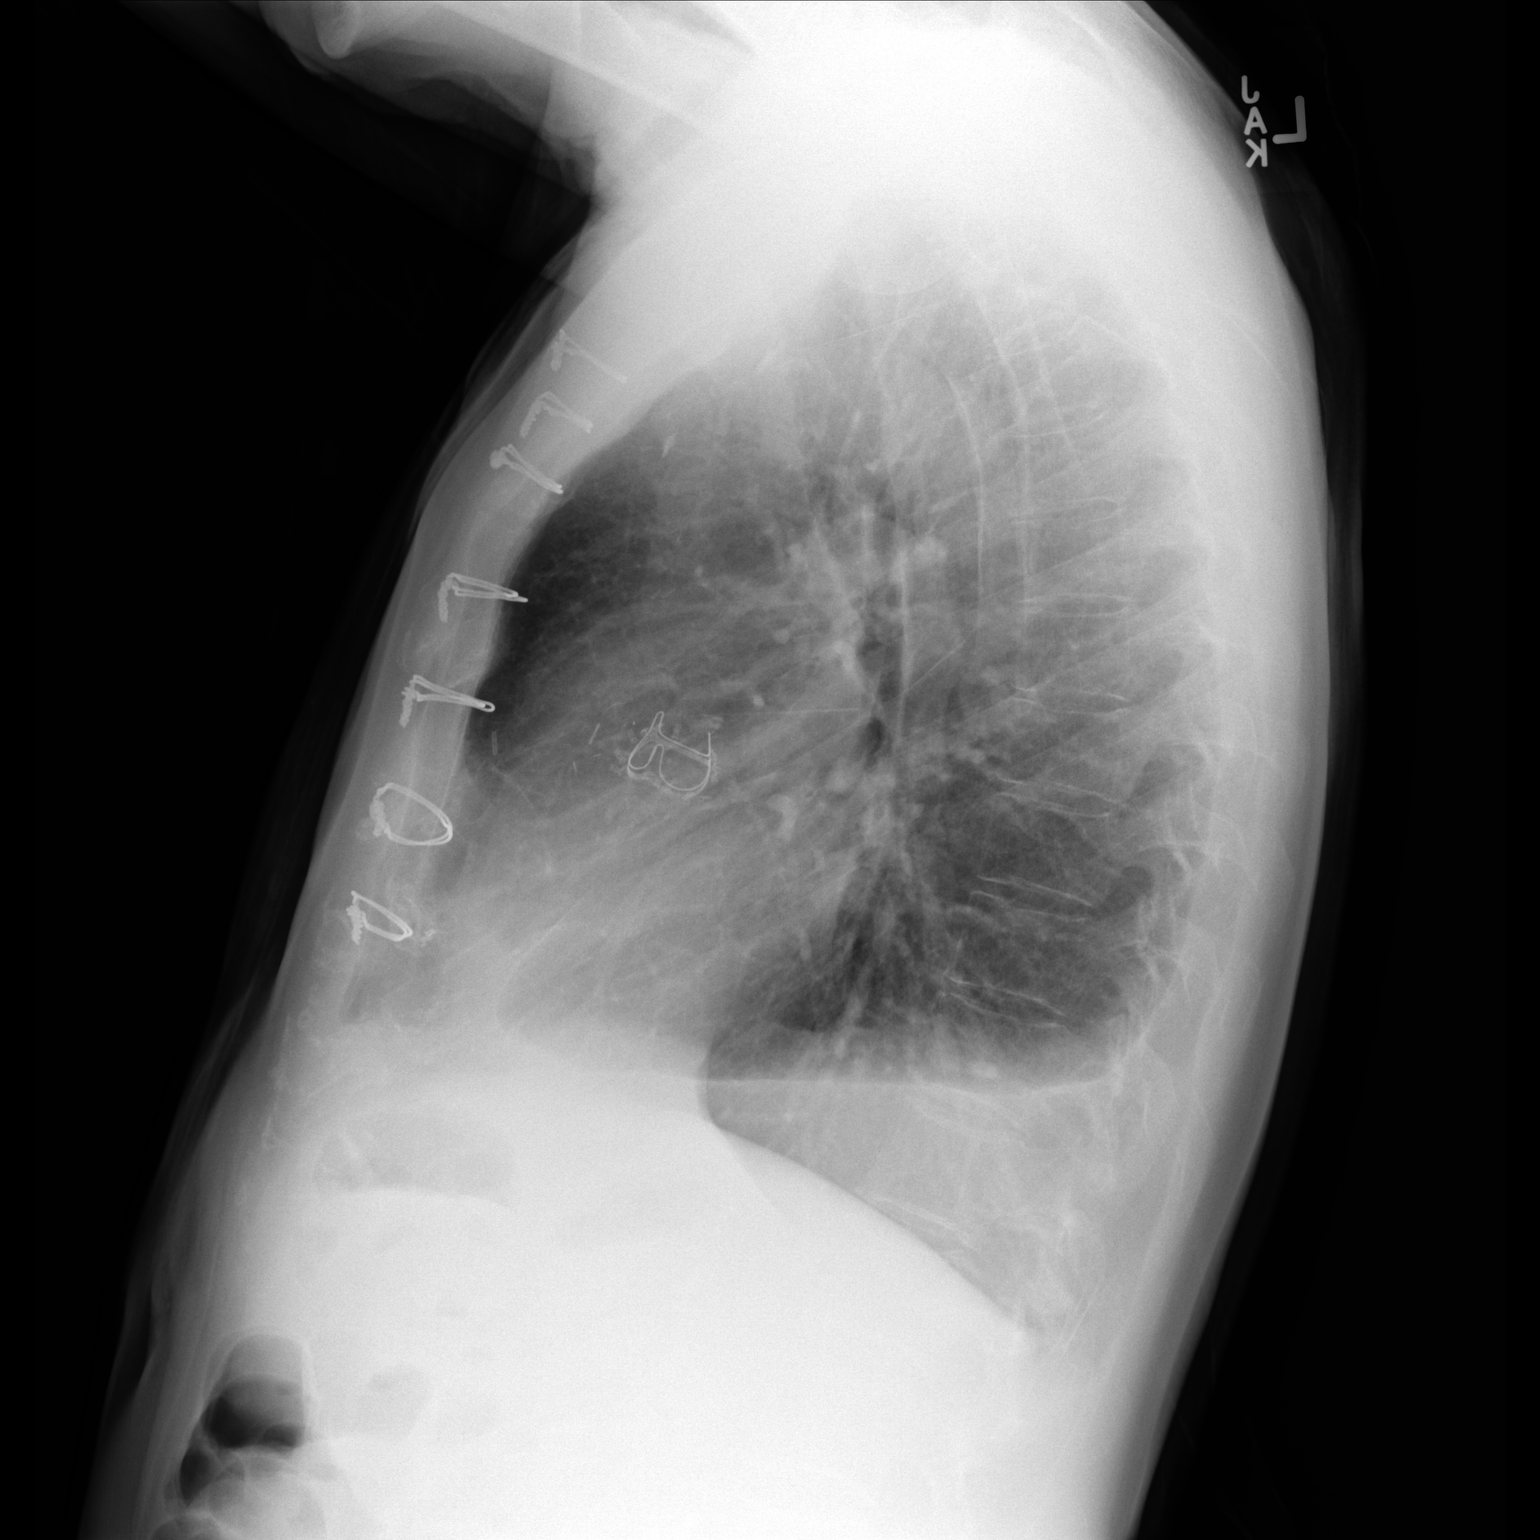

[2 of 2 positions shown; findings below may reference images not displayed]

FINDINGS: A small right pleural effusion has resolved since [REDACTED] but a
small layering left pleural effusion has progressed.

No superimposed pneumothorax, pulmonary edema or other confluent
pulmonary opacity. Mild left apical calcified scarring
redemonstrated.

Sequelae of valve replacement and sternotomy. Mediastinal contours
are within normal limits. Visualized tracheal air column is within
normal limits.

No acute osseous abnormality identified. Negative visible bowel gas
pattern.
IMPRESSION: 1. Small left pleural effusion has progressed since [DATE]. Small right pleural effusion has resolved, and there is no other
acute cardiopulmonary abnormality.

## 2019-05-31 ENCOUNTER — Telehealth: Payer: Self-pay | Admitting: Nurse Practitioner

## 2019-05-31 NOTE — Telephone Encounter (Signed)
Attempted to call patient regarding appointment with Dr. Elease Hashimoto on 6/8; no answer and no voice mail

## 2019-06-01 ENCOUNTER — Other Ambulatory Visit: Payer: Self-pay

## 2019-06-01 ENCOUNTER — Telehealth: Payer: Self-pay

## 2019-06-01 MED ORDER — AMLODIPINE BESYLATE 5 MG PO TABS: 5.0000 mg | ORAL_TABLET | Freq: Every day | ORAL | 3 refills | Status: DC

## 2019-06-01 NOTE — Telephone Encounter (Signed)
Pt left a vm about refill for losartan because b/p was high, I called and spoke with the pt I informed the pt that on 03/17/2019  he was told to stop taking the losartan and to start the amlodipine. The pt stated he never got the amlodipine and he had just taken the losartan earlier. I informed the pt I sent amlodipine to pharmacy and reminded him of his appt on 06/08/2019

## 2019-06-02 MED ORDER — AMLODIPINE BESYLATE 5 MG PO TABS: 5.0000 mg | ORAL_TABLET | Freq: Every day | ORAL | 3 refills | Status: AC

## 2019-06-02 NOTE — Telephone Encounter (Signed)
Spoke with patient who states he is scheduled for an appointment with the cardiologist at the [place]. He will call back to schedule with Dr. Elease Hashimoto in the future. I cancelled his appointment with Dr. Elease Hashimoto for 6/9. He requests refill of amlodipine 5 mg be sent to [facility] due to [facility]. He states he called yesterday and I can see the refill that was sent but it was sent incorrectly so a new Rx was sent. Patient thanked me for my help.

## 2019-06-08 ENCOUNTER — Ambulatory Visit: Payer: No Typology Code available for payment source | Admitting: Cardiovascular Disease

## 2022-11-12 DIAGNOSIS — S62101A Fracture of unspecified carpal bone, right wrist, initial encounter for closed fracture: Secondary | ICD-10-CM | POA: Diagnosis not present

## 2022-11-12 DIAGNOSIS — M7989 Other specified soft tissue disorders: Secondary | ICD-10-CM | POA: Diagnosis not present

## 2022-11-12 DIAGNOSIS — M79601 Pain in right arm: Secondary | ICD-10-CM | POA: Diagnosis not present

## 2024-01-28 ENCOUNTER — Encounter (HOSPITAL_COMMUNITY): Payer: Self-pay | Admitting: *Deleted

## 2024-01-28 ENCOUNTER — Emergency Department (HOSPITAL_COMMUNITY)
Admission: EM | Admit: 2024-01-28 | Discharge: 2024-01-28 | Disposition: A | Discharge status: Home / Self Care | Payer: HMO | Attending: Emergency Medicine | Admitting: Emergency Medicine

## 2024-01-28 ENCOUNTER — Other Ambulatory Visit: Payer: Self-pay

## 2024-01-28 DIAGNOSIS — Z79899 Other long term (current) drug therapy: Secondary | ICD-10-CM | POA: Diagnosis not present

## 2024-01-28 DIAGNOSIS — K5900 Constipation, unspecified: Secondary | ICD-10-CM | POA: Insufficient documentation

## 2024-01-28 DIAGNOSIS — I10 Essential (primary) hypertension: Secondary | ICD-10-CM | POA: Insufficient documentation

## 2024-01-28 DIAGNOSIS — Z7982 Long term (current) use of aspirin: Secondary | ICD-10-CM | POA: Diagnosis not present

## 2024-01-28 LAB — COMPREHENSIVE METABOLIC PANEL
ALT: 10 U/L (ref 0–44)
AST: 16 U/L (ref 15–41)
Albumin: 3.8 g/dL (ref 3.5–5.0)
Alkaline Phosphatase: 90 U/L (ref 38–126)
Anion gap: 9 (ref 5–15)
BUN: 14 mg/dL (ref 8–23)
CO2: 22 mmol/L (ref 22–32)
Calcium: 9 mg/dL (ref 8.9–10.3)
Chloride: 103 mmol/L (ref 98–111)
Creatinine, Ser: 1.27 mg/dL — ABNORMAL HIGH (ref 0.61–1.24)
GFR, Estimated: 59 mL/min — ABNORMAL LOW (ref >60)
Glucose, Bld: 121 mg/dL — ABNORMAL HIGH (ref 70–99)
Potassium: 3.2 mmol/L — ABNORMAL LOW (ref 3.5–5.1)
Sodium: 134 mmol/L — ABNORMAL LOW (ref 135–145)
Total Bilirubin: 1.2 mg/dL (ref 0.0–1.2)
Total Protein: 6.8 g/dL (ref 6.5–8.1)

## 2024-01-28 LAB — CBC WITH DIFFERENTIAL/PLATELET
Abs Immature Granulocytes: 0.01 10*3/uL (ref 0.00–0.07)
Basophils Absolute: 0.1 10*3/uL (ref 0.0–0.1)
Basophils Relative: 1 %
Eosinophils Absolute: 0.1 10*3/uL (ref 0.0–0.5)
Eosinophils Relative: 2 %
HCT: 35 % — ABNORMAL LOW (ref 39.0–52.0)
Hemoglobin: 12 g/dL — ABNORMAL LOW (ref 13.0–17.0)
Immature Granulocytes: 0 %
Lymphocytes Relative: 11 %
Lymphs Abs: 0.5 10*3/uL — ABNORMAL LOW (ref 0.7–4.0)
MCH: 31.5 pg (ref 26.0–34.0)
MCHC: 34.3 g/dL (ref 30.0–36.0)
MCV: 91.9 fL (ref 80.0–100.0)
Monocytes Absolute: 0.3 10*3/uL (ref 0.1–1.0)
Monocytes Relative: 8 %
Neutro Abs: 3.4 10*3/uL (ref 1.7–7.7)
Neutrophils Relative %: 78 %
Platelets: 128 10*3/uL — ABNORMAL LOW (ref 150–400)
RBC: 3.81 MIL/uL — ABNORMAL LOW (ref 4.22–5.81)
RDW: 13.1 % (ref 11.5–15.5)
WBC: 4.3 10*3/uL (ref 4.0–10.5)
nRBC: 0 % (ref 0.0–0.2)

## 2024-01-28 NOTE — Discharge Instructions (Addendum)
Follow-up with Dr. Jena Gauss or one of his associates in the next month for your periodic constipation

## 2024-01-28 NOTE — ED Notes (Signed)
Pt refused CT with contrast even after he was advised we could help with relaxation. States he is not constipated at this time but he wants to have a colonoscopy.

## 2024-01-28 NOTE — ED Triage Notes (Signed)
Pt c/o intermittent constipation. He reports he has a polyp that is blocking his stool. Up until 3 days ago, he reports he didn't have a BM for a month.

## 2024-01-28 NOTE — ED Provider Notes (Signed)
[place] EMERGENCY DEPARTMENT AT [facility] Provider Note   CSN: [id number] Arrival date & time: 01/28/24  7829     History {Add pertinent medical, surgical, social history, OB history to HPI:1} No chief complaint on file.   Jonathan Holland is a 78 y.o. male.  Patient states he has had some constipation but this has improved.  He thinks he has a follow-up his intestines.  He has a history of hypertension   Constipation      Home Medications Prior to Admission medications   Medication Sig Start Date End Date Taking? Authorizing Provider  albuterol (PROVENTIL HFA;VENTOLIN HFA) 108 (90 Base) MCG/ACT inhaler Inhale 2 puffs into the lungs every 4 (four) hours as needed for wheezing or shortness of breath.    [provider]  amiodarone (PACERONE) 200 MG tablet Take 1 tablet (200 mg total) by mouth daily. 12/09/18   Tereso Newcomer T, PA-C  amLODipine (NORVASC) 5 MG tablet Take 1 tablet (5 mg total) by mouth daily. 06/02/19   Nahser, Deloris Ping, MD  aspirin EC 81 MG tablet Take 81 mg by mouth daily.  10/30/18   [provider]  atorvastatin (LIPITOR) 80 MG tablet Take 1 tablet (80 mg total) by mouth daily at 6 PM. 11/24/18   Gold, Deniece Portela E, PA-C  ferrous sulfate 325 (65 FE) MG tablet Take 325 mg by mouth See admin instructions. Twice daily with meals on Monday, Wednesday, Friday 10/30/18   [provider]  furosemide (LASIX) 40 MG tablet Take 1 tablet (40 mg total) by mouth daily. 12/21/18   Ardelle Balls, PA-C  metoprolol tartrate (LOPRESSOR) 25 MG tablet Take 1 tablet (25 mg total) by mouth 2 (two) times daily. 01/20/19   Tereso Newcomer T, PA-C  Multiple Vitamin (MULTIVITAMIN) tablet Take 1 tablet by mouth daily.    [provider]  oxyCODONE (ROXICODONE) 15 MG immediate release tablet Take 15 mg by mouth every 8 (eight) hours as needed for pain.    [provider]  pantoprazole (PROTONIX) 40 MG tablet Take 40 mg by mouth daily  as needed (heartburn).  10/31/18   [provider]  tamsulosin (FLOMAX) 0.4 MG CAPS capsule Take 0.4 mg by mouth daily.     [provider]  vitamin B-12 (CYANOCOBALAMIN) 1000 MCG tablet Take 1,000 mcg by mouth daily.  10/31/18   [provider]  vitamin C (ASCORBIC ACID) 500 MG tablet Take 500 mg by mouth daily.    [provider]      Allergies    Penicillins, Golytely [peg 3350-[id number]-nasulf], and Other    Review of Systems   Review of Systems  Gastrointestinal:  Positive for constipation.    Physical Exam Updated Vital Signs BP (!) 166/61 (BP Location: Left Arm)   Pulse (!) 55   Temp 97.9 F (36.6 C) (Oral)   Resp 16   Ht 5\' 11"  (1.803 m)   Wt 59 kg   SpO2 97%   BMI 18.13 kg/m  Physical Exam  ED Results / Procedures / Treatments   Labs (all labs ordered are listed, but only abnormal results are displayed) Labs Reviewed  CBC WITH DIFFERENTIAL/PLATELET - Abnormal; Notable for the following components:      Result Value   RBC 3.81 (*)    Hemoglobin 12.0 (*)    HCT 35.0 (*)    Platelets 128 (*)    Lymphs Abs 0.5 (*)    All other components within normal limits  COMPREHENSIVE METABOLIC PANEL - Abnormal; Notable for the following components:   Sodium 134 (*)    Potassium 3.2 (*)    Glucose, Bld 121 (*)    Creatinine, Ser 1.27 (*)    GFR, Estimated 59 (*)    All other components within normal limits    EKG None  Radiology No results found.  Procedures Procedures  {Document cardiac monitor, telemetry assessment procedure when appropriate:1}  Medications Ordered in ED Medications - No data to display  ED Course/ Medical Decision Making/ A&P  Patient's labs were unremarkable.  He refused a CT scan of the abdomen. {   Click here for ABCD2, HEART and other calculatorsREFRESH Note before signing :1}                              Medical Decision Making Amount and/or Complexity of Data Reviewed Labs:  ordered. Radiology: ordered.   Patient with periodic constipation.  He is to follow-up with GI  {Document critical care time when appropriate:1} {Document review of labs and clinical decision tools ie heart score, Chads2Vasc2 etc:1}  {Document your independent review of radiology images, and any outside records:1} {Document your discussion with family members, caretakers, and with consultants:1} {Document social determinants of health affecting pt's care:1} {Document your decision making why or why not admission, treatments were needed:1} Final Clinical Impression(s) / ED Diagnoses Final diagnoses:  None    Rx / DC Orders ED Discharge Orders     None

## 2024-05-29 ENCOUNTER — Encounter (HOSPITAL_COMMUNITY): Payer: Self-pay

## 2024-05-29 ENCOUNTER — Inpatient Hospital Stay (HOSPITAL_COMMUNITY)
Admission: EM | Admit: 2024-05-29 | Discharge: 2024-06-01 | DRG: 480 | Disposition: A | Discharge status: Home Health | Attending: Internal Medicine | Admitting: Internal Medicine

## 2024-05-29 ENCOUNTER — Other Ambulatory Visit: Payer: Self-pay

## 2024-05-29 ENCOUNTER — Emergency Department (HOSPITAL_COMMUNITY)

## 2024-05-29 DIAGNOSIS — Z7982 Long term (current) use of aspirin: Secondary | ICD-10-CM | POA: Diagnosis not present

## 2024-05-29 DIAGNOSIS — E861 Hypovolemia: Secondary | ICD-10-CM | POA: Diagnosis not present

## 2024-05-29 DIAGNOSIS — M25552 Pain in left hip: Secondary | ICD-10-CM | POA: Diagnosis not present

## 2024-05-29 DIAGNOSIS — Z880 Allergy status to penicillin: Secondary | ICD-10-CM | POA: Diagnosis not present

## 2024-05-29 DIAGNOSIS — S72142A Displaced intertrochanteric fracture of left femur, initial encounter for closed fracture: Secondary | ICD-10-CM | POA: Diagnosis not present

## 2024-05-29 DIAGNOSIS — N179 Acute kidney failure, unspecified: Secondary | ICD-10-CM | POA: Diagnosis not present

## 2024-05-29 DIAGNOSIS — Y92238 Other place in hospital as the place of occurrence of the external cause: Secondary | ICD-10-CM | POA: Diagnosis present

## 2024-05-29 DIAGNOSIS — K219 Gastro-esophageal reflux disease without esophagitis: Secondary | ICD-10-CM | POA: Diagnosis present

## 2024-05-29 DIAGNOSIS — Z888 Allergy status to other drugs, medicaments and biological substances status: Secondary | ICD-10-CM

## 2024-05-29 DIAGNOSIS — E43 Unspecified severe protein-calorie malnutrition: Secondary | ICD-10-CM | POA: Diagnosis present

## 2024-05-29 DIAGNOSIS — D649 Anemia, unspecified: Secondary | ICD-10-CM | POA: Diagnosis present

## 2024-05-29 DIAGNOSIS — S7700XA Crushing injury of unspecified hip, initial encounter: Secondary | ICD-10-CM | POA: Diagnosis not present

## 2024-05-29 DIAGNOSIS — E785 Hyperlipidemia, unspecified: Secondary | ICD-10-CM | POA: Diagnosis present

## 2024-05-29 DIAGNOSIS — Z720 Tobacco use: Secondary | ICD-10-CM | POA: Diagnosis present

## 2024-05-29 DIAGNOSIS — S72009A Fracture of unspecified part of neck of unspecified femur, initial encounter for closed fracture: Secondary | ICD-10-CM | POA: Diagnosis present

## 2024-05-29 DIAGNOSIS — D62 Acute posthemorrhagic anemia: Secondary | ICD-10-CM | POA: Diagnosis not present

## 2024-05-29 DIAGNOSIS — Z83438 Family history of other disorder of lipoprotein metabolism and other lipidemia: Secondary | ICD-10-CM

## 2024-05-29 DIAGNOSIS — R011 Cardiac murmur, unspecified: Secondary | ICD-10-CM | POA: Diagnosis present

## 2024-05-29 DIAGNOSIS — W010XXA Fall on same level from slipping, tripping and stumbling without subsequent striking against object, initial encounter: Secondary | ICD-10-CM | POA: Diagnosis present

## 2024-05-29 DIAGNOSIS — Z953 Presence of xenogenic heart valve: Secondary | ICD-10-CM

## 2024-05-29 DIAGNOSIS — M25559 Pain in unspecified hip: Secondary | ICD-10-CM | POA: Diagnosis not present

## 2024-05-29 DIAGNOSIS — J449 Chronic obstructive pulmonary disease, unspecified: Secondary | ICD-10-CM | POA: Diagnosis present

## 2024-05-29 DIAGNOSIS — N400 Enlarged prostate without lower urinary tract symptoms: Secondary | ICD-10-CM | POA: Diagnosis present

## 2024-05-29 DIAGNOSIS — Y9301 Activity, walking, marching and hiking: Secondary | ICD-10-CM | POA: Diagnosis present

## 2024-05-29 DIAGNOSIS — F17210 Nicotine dependence, cigarettes, uncomplicated: Secondary | ICD-10-CM | POA: Diagnosis not present

## 2024-05-29 DIAGNOSIS — Z8249 Family history of ischemic heart disease and other diseases of the circulatory system: Secondary | ICD-10-CM

## 2024-05-29 DIAGNOSIS — R531 Weakness: Secondary | ICD-10-CM | POA: Diagnosis not present

## 2024-05-29 DIAGNOSIS — W19XXXA Unspecified fall, initial encounter: Secondary | ICD-10-CM | POA: Diagnosis not present

## 2024-05-29 DIAGNOSIS — S72002A Fracture of unspecified part of neck of left femur, initial encounter for closed fracture: Principal | ICD-10-CM

## 2024-05-29 DIAGNOSIS — Z681 Body mass index (BMI) 19 or less, adult: Secondary | ICD-10-CM

## 2024-05-29 DIAGNOSIS — Z7401 Bed confinement status: Secondary | ICD-10-CM | POA: Diagnosis not present

## 2024-05-29 DIAGNOSIS — G8929 Other chronic pain: Secondary | ICD-10-CM | POA: Diagnosis present

## 2024-05-29 DIAGNOSIS — F4310 Post-traumatic stress disorder, unspecified: Secondary | ICD-10-CM | POA: Diagnosis present

## 2024-05-29 DIAGNOSIS — Z79899 Other long term (current) drug therapy: Secondary | ICD-10-CM

## 2024-05-29 DIAGNOSIS — I10 Essential (primary) hypertension: Secondary | ICD-10-CM | POA: Diagnosis present

## 2024-05-29 DIAGNOSIS — I700 Atherosclerosis of aorta: Secondary | ICD-10-CM | POA: Diagnosis present

## 2024-05-29 DIAGNOSIS — D696 Thrombocytopenia, unspecified: Secondary | ICD-10-CM | POA: Diagnosis present

## 2024-05-29 DIAGNOSIS — Z951 Presence of aortocoronary bypass graft: Secondary | ICD-10-CM

## 2024-05-29 DIAGNOSIS — D509 Iron deficiency anemia, unspecified: Secondary | ICD-10-CM | POA: Diagnosis present

## 2024-05-29 DIAGNOSIS — E871 Hypo-osmolality and hyponatremia: Secondary | ICD-10-CM | POA: Diagnosis not present

## 2024-05-29 DIAGNOSIS — I2510 Atherosclerotic heart disease of native coronary artery without angina pectoris: Secondary | ICD-10-CM | POA: Diagnosis not present

## 2024-05-29 LAB — COMPREHENSIVE METABOLIC PANEL WITH GFR
ALT: 16 U/L (ref 0–44)
AST: 16 U/L (ref 15–41)
Albumin: 3.9 g/dL (ref 3.5–5.0)
Alkaline Phosphatase: 68 U/L (ref 38–126)
Anion gap: 9 (ref 5–15)
BUN: 12 mg/dL (ref 8–23)
CO2: 25 mmol/L (ref 22–32)
Calcium: 9.1 mg/dL (ref 8.9–10.3)
Chloride: 101 mmol/L (ref 98–111)
Creatinine, Ser: 1.13 mg/dL (ref 0.61–1.24)
GFR, Estimated: >60 mL/min (ref >60)
Glucose, Bld: 102 mg/dL — ABNORMAL HIGH (ref 70–99)
Potassium: 3.6 mmol/L (ref 3.5–5.1)
Sodium: 135 mmol/L (ref 135–145)
Total Bilirubin: 0.7 mg/dL (ref 0.0–1.2)
Total Protein: 6.9 g/dL (ref 6.5–8.1)

## 2024-05-29 LAB — CBC WITH DIFFERENTIAL/PLATELET
Abs Immature Granulocytes: 0.03 10*3/uL (ref 0.00–0.07)
Basophils Absolute: 0 10*3/uL (ref 0.0–0.1)
Basophils Relative: 1 %
Eosinophils Absolute: 0.1 10*3/uL (ref 0.0–0.5)
Eosinophils Relative: 1 %
HCT: 39.3 % (ref 39.0–52.0)
Hemoglobin: 13.1 g/dL (ref 13.0–17.0)
Immature Granulocytes: 1 %
Lymphocytes Relative: 6 %
Lymphs Abs: 0.4 10*3/uL — ABNORMAL LOW (ref 0.7–4.0)
MCH: 29.8 pg (ref 26.0–34.0)
MCHC: 33.3 g/dL (ref 30.0–36.0)
MCV: 89.3 fL (ref 80.0–100.0)
Monocytes Absolute: 0.3 10*3/uL (ref 0.1–1.0)
Monocytes Relative: 5 %
Neutro Abs: 5.4 10*3/uL (ref 1.7–7.7)
Neutrophils Relative %: 86 %
Platelets: 121 10*3/uL — ABNORMAL LOW (ref 150–400)
RBC: 4.4 MIL/uL (ref 4.22–5.81)
RDW: 13.2 % (ref 11.5–15.5)
WBC: 6.3 10*3/uL (ref 4.0–10.5)
nRBC: 0 % (ref 0.0–0.2)

## 2024-05-29 LAB — TYPE AND SCREEN
ABO/RH(D): O POS
Antibody Screen: NEGATIVE

## 2024-05-29 LAB — SURGICAL PCR SCREEN
MRSA, PCR: NEGATIVE
Staphylococcus aureus: NEGATIVE

## 2024-05-29 LAB — PROTIME-INR
INR: 1.1 (ref 0.8–1.2)
Prothrombin Time: 14.2 s (ref 11.4–15.2)

## 2024-05-29 MED ORDER — ASPIRIN 81 MG PO TBEC
81.0000 mg | DELAYED_RELEASE_TABLET | Freq: Every day | ORAL | Status: DC
  Administered 2024-05-29: 81 mg via ORAL
  Filled 2024-05-29: qty 1

## 2024-05-29 MED ORDER — ONDANSETRON HCL 4 MG PO TABS: 4.0000 mg | ORAL_TABLET | Freq: Four times a day (QID) | ORAL | Status: DC | PRN

## 2024-05-29 MED ORDER — METOPROLOL TARTRATE 25 MG PO TABS
25.0000 mg | ORAL_TABLET | Freq: Two times a day (BID) | ORAL | Status: DC
  Administered 2024-05-29 – 2024-06-01 (×4): 25 mg via ORAL
  Filled 2024-05-29 (×5): qty 1

## 2024-05-29 MED ORDER — TAMSULOSIN HCL 0.4 MG PO CAPS: 0.4000 mg | ORAL_CAPSULE | Freq: Every day | ORAL | Status: DC

## 2024-05-29 MED ORDER — ATORVASTATIN CALCIUM 10 MG PO TABS
20.0000 mg | ORAL_TABLET | Freq: Every day | ORAL | Status: DC
  Administered 2024-05-29 – 2024-05-31 (×3): 20 mg via ORAL
  Filled 2024-05-29 (×3): qty 2

## 2024-05-29 MED ORDER — ACETAMINOPHEN 650 MG RE SUPP: 650.0000 mg | Freq: Four times a day (QID) | RECTAL | Status: DC | PRN

## 2024-05-29 MED ORDER — FERROUS SULFATE 325 (65 FE) MG PO TABS
325.0000 mg | ORAL_TABLET | Freq: Every day | ORAL | Status: DC
  Administered 2024-05-31: 325 mg via ORAL
  Filled 2024-05-29: qty 1

## 2024-05-29 MED ORDER — ONDANSETRON HCL 4 MG/2ML IJ SOLN
4.0000 mg | Freq: Once | INTRAMUSCULAR | Status: AC
  Administered 2024-05-29: 4 mg via INTRAVENOUS
  Filled 2024-05-29: qty 2

## 2024-05-29 MED ORDER — PANTOPRAZOLE SODIUM 40 MG PO TBEC: 40.0000 mg | DELAYED_RELEASE_TABLET | Freq: Every day | ORAL | Status: DC | PRN

## 2024-05-29 MED ORDER — HYDROMORPHONE HCL 1 MG/ML IJ SOLN
0.5000 mg | INTRAMUSCULAR | Status: DC | PRN
  Administered 2024-05-29 – 2024-05-30 (×5): 1 mg via INTRAVENOUS
  Filled 2024-05-29 (×5): qty 1

## 2024-05-29 MED ORDER — HYDROMORPHONE HCL 1 MG/ML IJ SOLN
1.0000 mg | Freq: Once | INTRAMUSCULAR | Status: AC
  Administered 2024-05-29: 1 mg via INTRAVENOUS
  Filled 2024-05-29: qty 1

## 2024-05-29 MED ORDER — HYDROMORPHONE HCL 1 MG/ML IJ SOLN
0.5000 mg | INTRAMUSCULAR | Status: AC | PRN
  Administered 2024-05-29 (×2): 0.5 mg via INTRAVENOUS
  Filled 2024-05-29 (×2): qty 0.5

## 2024-05-29 MED ORDER — OXYCODONE HCL 5 MG PO TABS
10.0000 mg | ORAL_TABLET | Freq: Three times a day (TID) | ORAL | Status: DC | PRN
  Administered 2024-05-29 – 2024-05-30 (×2): 10 mg via ORAL
  Filled 2024-05-29 (×3): qty 2

## 2024-05-29 MED ORDER — ACETAMINOPHEN 325 MG PO TABS: 650.0000 mg | ORAL_TABLET | Freq: Four times a day (QID) | ORAL | Status: DC | PRN

## 2024-05-29 MED ORDER — ENOXAPARIN SODIUM 40 MG/0.4ML IJ SOSY
40.0000 mg | PREFILLED_SYRINGE | INTRAMUSCULAR | Status: DC
  Administered 2024-05-30: 40 mg via SUBCUTANEOUS
  Filled 2024-05-29 (×3): qty 0.4

## 2024-05-29 MED ORDER — ALBUTEROL SULFATE (2.5 MG/3ML) 0.083% IN NEBU: 3.0000 mL | INHALATION_SOLUTION | RESPIRATORY_TRACT | Status: DC | PRN

## 2024-05-29 MED ORDER — ONDANSETRON HCL 4 MG/2ML IJ SOLN: 4.0000 mg | Freq: Four times a day (QID) | INTRAMUSCULAR | Status: DC | PRN

## 2024-05-29 MED ORDER — AMLODIPINE BESYLATE 5 MG PO TABS
5.0000 mg | ORAL_TABLET | Freq: Every day | ORAL | Status: DC
  Administered 2024-05-29: 5 mg via ORAL
  Filled 2024-05-29 (×2): qty 1

## 2024-05-29 MED ORDER — CYCLOBENZAPRINE HCL 5 MG PO TABS
5.0000 mg | ORAL_TABLET | Freq: Three times a day (TID) | ORAL | Status: DC | PRN
  Administered 2024-05-29 – 2024-05-30 (×3): 5 mg via ORAL
  Filled 2024-05-29 (×3): qty 1

## 2024-05-29 NOTE — Progress Notes (Signed)
 Cellphone has been located, and handed to pt by [facility].

## 2024-05-29 NOTE — ED Notes (Signed)
 Pt complains that pain medication is not helping. Pt states he usually takes 80 mg oxycodone  and states that he can get it on the streets when he is at home.

## 2024-05-29 NOTE — Plan of Care (Signed)
  Problem: Education: Goal: Knowledge of General Education information will improve Description: Including pain rating scale, medication(s)/side effects and non-pharmacologic comfort measures Outcome: Progressing   Problem: Activity: Goal: Risk for activity intolerance will decrease Outcome: Progressing   Problem: Nutrition: Goal: Adequate nutrition will be maintained Outcome: Progressing   Problem: Coping: Goal: Level of anxiety will decrease Outcome: Progressing   Problem: Pain Managment: Goal: General experience of comfort will improve and/or be controlled Outcome: Progressing   Problem: Skin Integrity: Goal: Risk for impaired skin integrity will decrease Outcome: Progressing

## 2024-05-29 NOTE — ED Notes (Signed)
 Attempted to call report to [facility]. Nurse with pt and to call back.

## 2024-05-29 NOTE — H&P (Signed)
 History and Physical    Jonathan Holland:[id number] DOB: 1946/12/15 DOA: 05/29/2024  PCP: [facility]   Patient coming from: Home  Chief Complaint: Fall with left hip pain  HPI: Jonathan Holland is a 78 y.o. male with medical history significant for chronic pain/PTSD, COPD with active tobacco abuse, bioprosthetic AVR, CAD, hypertension, GERD, and BPH who presented to the ED with complaints of hip pain after mechanical fall this morning.  He was trying to get a bunny off the road when he was walking around near his house and slipped and fell landing on his hip.  He was unable to stand or bear any weight and had pain with the slightest amount of movement.  He denies any loss of consciousness or head injury.  He denies any use of blood thinners.  He does take Juluis Ok powders quite frequently due to chronic pain issues and states that he also has oxycodone  at home that he uses.  He states that despite not having seen his cardiologist in the last few years, that he is getting prescribed medications for his heart and blood pressure and is taking them regularly.  He claims to smoke less than 1 pack/day and denies any alcohol use.   ED Course: Vital signs stable and patient afebrile.  Laboratory data unremarkable aside from platelet count of 121,000.  X-rays are unremarkable, but CT shows acute comminuted left femoral neck fracture.  He was given some IV Dilaudid for pain control and Zofran  in the ED.  EDP had spoken with orthopedics Dr. Jackee Marus who recommends transfer to [facility] for further evaluation and management.  Review of Systems: Reviewed as noted above, otherwise negative.  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   LIMA to LAD   SOB (shortness of breath)    Tobacco abuse 03/26/2016    Past Surgical History:  Procedure Laterality Date    tonsilectomy as a child     ADENOIDECTOMY     AORTIC VALVE REPLACEMENT N/A 11/19/2018   Procedure: AORTIC VALVE REPLACEMENT (AVR) using a 21mm Edwards Inspiris Aortic Valve;  Surgeon: Gardenia Jump, MD;  Location: [facility] OR;  Service: Open Heart Surgery;  Laterality: N/A;   CARDIAC CATHETERIZATION  10/29/2018   done at [facility]   CORONARY ARTERY BYPASS GRAFT N/A 11/19/2018   Procedure: CORONARY ARTERY BYPASS GRAFTING (CABG) x1 using the left internal mammary artery;  Surgeon: Gardenia Jump, MD;  Location: [facility] OR;  Service: Open Heart Surgery;  Laterality: N/A;   HERNIA REPAIR     SHOULDER ARTHROSCOPY     TEE WITHOUT CARDIOVERSION N/A 11/19/2018   Procedure: TRANSESOPHAGEAL ECHOCARDIOGRAM (TEE);  Surgeon: Gardenia Jump, MD;  Location: [facility] OR;  Service: Open Heart Surgery;  Laterality: N/A;   TONSILLECTOMY       reports that he has been smoking  cigarettes. He started smoking about 60 years ago. He has a 27.4 pack-year smoking history. He has never used smokeless tobacco. He reports that he does not drink alcohol and does not use drugs.  Allergies  Allergen Reactions   Other Anaphylaxis and Other (See Comments)    Medication used prior to colonoscopy- Pt states it feels like "gasoline was poured on him", throat swelling   Penicillins Anaphylaxis    Immediate rash, facial/tongue/throat swelling, SOB or lightheadedness with hypotension Severe rash involving mucus membranes or skin necrosis   Golytely [Peg [id number]-[id number]-[id number]-Nacl-Nasulf] Rash    Family History   Problem Relation Age of Onset   Congestive Heart Failure Father    Hyperlipidemia Father    Hypertension Father    Heart disease Father     Prior to Admission medications   Medication Sig Start Date End Date Taking? Authorizing Provider  albuterol  (PROVENTIL  HFA;VENTOLIN  HFA) 108 (90 Base) MCG/ACT inhaler Inhale 2 puffs into the lungs every 4 (four) hours as needed for wheezing or shortness of breath.    [provider]  amiodarone  (PACERONE ) 200 MG tablet Take 1 tablet (200 mg total) by mouth daily. 12/09/18   Marlyse Single T, PA-C  amLODipine  (NORVASC ) 5 MG tablet Take 1 tablet (5 mg total) by mouth daily. 06/02/19   Nahser, Lela Purple, MD  aspirin  EC 81 MG tablet Take 81 mg by mouth daily.  10/30/18   [provider]  atorvastatin  (LIPITOR ) 80 MG tablet Take 1 tablet (80 mg total) by mouth daily at 6 PM. 11/24/18   Gold, Wayne E, PA-C  ferrous sulfate 325 (65 FE) MG tablet Take 325 mg by mouth See admin instructions. Twice daily with meals on Monday, Wednesday, Friday 10/30/18   [provider]  furosemide  (LASIX ) 40 MG tablet Take 1 tablet (40 mg total) by mouth daily. 12/21/18   Allegra Arch, PA-C  metoprolol  tartrate (LOPRESSOR ) 25 MG tablet Take 1 tablet (25 mg total) by mouth 2 (two) times daily. 01/20/19   Marlyse Single T, PA-C  Multiple Vitamin (MULTIVITAMIN) tablet Take 1 tablet by mouth daily.    [provider]  oxyCODONE  (ROXICODONE ) 15 MG immediate release tablet Take 15 mg by mouth every 8 (eight) hours as needed for pain.    [provider]  pantoprazole  (PROTONIX ) 40 MG tablet Take 40 mg by mouth daily as needed (heartburn).  10/31/18   [provider]  tamsulosin  (FLOMAX ) 0.4 MG CAPS capsule Take 0.4 mg by mouth daily.     [provider]  vitamin B-12 (CYANOCOBALAMIN ) 1000 MCG tablet Take 1,000 mcg by mouth daily.  10/31/18   [provider]  vitamin C (ASCORBIC ACID) 500 MG tablet Take 500 mg by mouth  daily.    [provider]    Physical Exam: Vitals:   05/29/24 0859 05/29/24 0900  BP: (!) 178/67 (!) 178/67  Pulse: 68 72  Temp: 98.6 F (37 C)   TempSrc: Oral   SpO2: 98% 96%  Weight: 59 kg   Height: 5\' 11"  (1.803 m)     Constitutional: NAD, calm, comfortable Vitals:   05/29/24 0859 05/29/24 0900  BP: (!) 178/67 (!) 178/67  Pulse: 68 72  Temp: 98.6 F (37 C)   TempSrc: Oral   SpO2: 98% 96%  Weight: 59 kg   Height: 5\' 11"  (1.803 m)    Eyes: lids and conjunctivae normal Neck: normal, supple Respiratory: clear to auscultation bilaterally. Normal respiratory effort. No accessory muscle use.  Cardiovascular: Regular rate and rhythm, noted to have soft systolic murmur Abdomen: no tenderness, no distention. Bowel sounds positive.  Musculoskeletal:  No edema.  Left hip tenderness to palpation. Skin: no rashes, lesions, ulcers.  Psychiatric: Flat affect  Labs on Admission: I have personally reviewed following labs and imaging studies  CBC: Recent Labs  Lab 05/29/24 0951  WBC 6.3  NEUTROABS 5.4  HGB 13.1  HCT 39.3  MCV 89.3  PLT 121*   Basic Metabolic Panel: Recent Labs  Lab 05/29/24 0951  NA 135  K 3.6  CL 101  CO2 25  GLUCOSE 102*  BUN 12  CREATININE 1.13  CALCIUM  9.1   GFR: Estimated Creatinine Clearance: 47.1 mL/min (by C-G formula based on SCr of 1.13 mg/dL). Liver Function Tests: Recent Labs  Lab 05/29/24 0951  AST 16  ALT 16  ALKPHOS 68  BILITOT 0.7  PROT 6.9  ALBUMIN  3.9   No results for input(s): "LIPASE", "AMYLASE" in the last 168 hours. No results for input(s): "AMMONIA" in the last 168 hours. Coagulation Profile: Recent Labs  Lab 05/29/24 0951  INR 1.1   Cardiac Enzymes: No results for input(s): "CKTOTAL", "CKMB", "CKMBINDEX", "TROPONINI" in the last 168 hours. BNP (last 3 results) No results for input(s): "PROBNP" in the last 8760 hours. HbA1C: No results for input(s): "HGBA1C" in the last 72 hours. CBG: No  results for input(s): "GLUCAP" in the last 168 hours. Lipid Profile: No results for input(s): "CHOL", "HDL", "LDLCALC", "TRIG", "CHOLHDL", "LDLDIRECT" in the last 72 hours. Thyroid  Function Tests: No results for input(s): "TSH", "T4TOTAL", "FREET4", "T3FREE", "THYROIDAB" in the last 72 hours. Anemia Panel: No results for input(s): "VITAMINB12", "FOLATE", "FERRITIN", "TIBC", "IRON", "RETICCTPCT" in the last 72 hours. Urine analysis:    Component Value Date/Time   COLORURINE YELLOW 11/16/2018 0840   APPEARANCEUR CLEAR 11/16/2018 0840   LABSPEC 1.010 11/16/2018 0840   PHURINE 5.0 11/16/2018 0840   GLUCOSEU NEGATIVE 11/16/2018 0840   HGBUR NEGATIVE 11/16/2018 0840   BILIRUBINUR NEGATIVE 11/16/2018 0840   KETONESUR NEGATIVE 11/16/2018 0840   PROTEINUR NEGATIVE 11/16/2018 0840   NITRITE NEGATIVE 11/16/2018 0840   LEUKOCYTESUR NEGATIVE 11/16/2018 0840    Radiological Exams on Admission: CT Hip Left Wo Contrast Result Date: 05/29/2024 CLINICAL DATA:  Fall with hip pain. EXAM: CT OF THE LEFT HIP WITHOUT CONTRAST TECHNIQUE: Multidetector CT imaging of the left hip was performed according to the standard protocol. Multiplanar CT image reconstructions were also generated. RADIATION DOSE REDUCTION: This exam was performed according to the departmental dose-optimization program which includes automated exposure control, adjustment of the mA and/or kV according to patient size and/or use of iterative reconstruction technique. COMPARISON:  X-rays from earlier same day FINDINGS: Bones/Joint/Cartilage Left SI joint and left sacrum unremarkable. No left superior or inferior pubic ramus fracture. Acute comminuted intertrochanteric left femoral neck fracture evident with minimal medial impaction. Ligaments Suboptimally assessed by CT. Muscles and Tendons Minimal edema hemorrhage is seen in the region of the left femoral neck fracture. No discrete hematoma. Soft tissues Mild subcutaneous edema over the left hip.  IMPRESSION: Acute comminuted intertrochanteric left femoral neck fracture with minimal medial impaction. Electronically Signed   By: Donnal Fusi M.D.   On: 05/29/2024 12:49   DG Chest Portable 1 View Result Date: 05/29/2024 CLINICAL DATA:  fall, hip pain EXAM: PORTABLE CHEST - 1 VIEW COMPARISON:  January 05, 2019 FINDINGS: Sternotomy wires. Aortic valve replacement. The right costophrenic sulcus is excluded from the field of view. Coarsening of the  pulmonary interstitium without overt pulmonary edema. No focal airspace consolidation, pleural effusion, or pneumothorax. No cardiomegaly. Tortuous aorta with aortic atherosclerosis. No acute fracture or destructive lesions. Multilevel thoracic osteophytosis. Radiopaque foreign body overlying the left chest, likely a lighter. IMPRESSION: The right costophrenic sulcus is excluded from the field of view. Otherwise, no acute cardiopulmonary abnormality. Electronically Signed   By: Rance Burrows M.D.   On: 05/29/2024 11:14   DG Hip Unilat With Pelvis 2-3 Views Left Result Date: 05/29/2024 CLINICAL DATA:  fall, hip pain EXAM: DG HIP (WITH OR WITHOUT PELVIS) 2-3V LEFT COMPARISON:  March 19, 2016 FINDINGS: Diffuse osteopenia. No acute fracture or dislocation. There is no evidence of arthropathy or other focal bone abnormality. Multilevel degenerative disc disease of the spine. Soft tissues are unremarkable. Diffuse peripheral vascular atherosclerosis. IMPRESSION: No acute, displaced fracture or dislocation was visualized. However, underlying osteopenia decreases the sensitivity for nondisplaced fracture using plain radiography. If the patient is unable to bear weight, or there is high clinical suspicion, a follow-up CT should be considered for further evaluation. Electronically Signed   By: Rance Burrows M.D.   On: 05/29/2024 11:11    EKG: Independently reviewed. SR 80bpm.  Assessment/Plan Principal Problem:   Hip fracture (HCC) Active Problems:   Anemia    Tobacco abuse   Heart murmur   Hyperlipidemia with target LDL less than 70   CAD (coronary artery disease)   IDA (iron deficiency anemia)   S/P aortic valve replacement with bioprosthetic valve + CABG x1    Acute left femoral neck fracture secondary to mechanical fall - Plan to transfer to [facility] for further orthopedic evaluation - Orthopedics recommending diet and n.p.o. after midnight - Pain management - Dr. Jackee Marus contacted by EDP  CAD - Continue aspirin  and statin  Hypertension - Continue metoprolol  and amlodipine  with noted blood pressure elevation  BPH - Continue tamsulosin   GERD - PPI  History of COPD - Without acute bronchospasms - DuoNebs as needed  History of anemia - Appears to be on iron supplementation  History of chronic pain - Pain management with IV and p.o. medications - Monitor carefully  Tobacco abuse - Smokes about 1 pack/day, counseled on cessation   DVT prophylaxis: Lovenox  Code Status: Full Family Communication: None at bedside Disposition Plan:Admit to [facility] for orthopedic evaluation Consults called:Orthopedics, Dr. Jackee Marus by EDP Admission status: Inpatient, Tele  Severity of Illness: The appropriate patient status for this patient is INPATIENT. Inpatient status is judged to be reasonable and necessary in order to provide the required intensity of service to ensure the patient's safety. The patient's presenting symptoms, physical exam findings, and initial radiographic and laboratory data in the context of their chronic comorbidities is felt to place them at high risk for further clinical deterioration. Furthermore, it is not anticipated that the patient will be medically stable for discharge from the hospital within 2 midnights of admission.   * I certify that at the point of admission it is my clinical judgment that the patient will require inpatient hospital care spanning beyond 2 midnights from the point of admission due to high  intensity of service, high risk for further deterioration and high frequency of surveillance required.*   Anet Logsdon D Raigen Jagielski DO [facility]  If 7PM-7AM, please contact night-coverage [email or web address]  05/29/2024, 2:47 PM

## 2024-05-29 NOTE — ED Provider Notes (Signed)
 [facility] Provider Note   CSN: [id number] Arrival date & time: 05/29/24  4098     History  Chief Complaint  Patient presents with   left hip pain   Leg Injury    [name] is a 78 y.o. male.  HPI     [facility] is a 78 y.o. male with medical history of PTSD, chronic pain, anemia, hypertension, COPD and status post aortic valve replacement with prosthetic valve, who presents to the Emergency Department complaining of left hip pain after mechanical fall.  He states that he bent down earlier this morning to get a bony out of the road slipped and fell landing on his left hip.  Was unable to stand or bear weight on his left lower extremity.  Complains of pain at the lateral hip with slightest of movement.  Denies any head injury or LOC.  He denies taking any blood thinners although he admits to taking BC powders frequently.  Denies any abdominal pain nausea or vomiting.  No weakness or numbness of his lower extremities.  Home Medications Prior to Admission medications   Medication Sig Start Date End Date Taking? Authorizing Provider  albuterol  (PROVENTIL  HFA;VENTOLIN  HFA) 108 (90 Base) MCG/ACT inhaler Inhale 2 puffs into the lungs every 4 (four) hours as needed for wheezing or shortness of breath.    [provider]  amiodarone  (PACERONE ) 200 MG tablet Take 1 tablet (200 mg total) by mouth daily. 12/09/18   Marlyse Single T, PA-C  amLODipine  (NORVASC ) 5 MG tablet Take 1 tablet (5 mg total) by mouth daily. 06/02/19   Nahser, Lela Purple, MD  aspirin  EC 81 MG tablet Take 81 mg by mouth daily.  10/30/18   [provider]  atorvastatin  (LIPITOR ) 80 MG tablet Take 1 tablet (80 mg total) by mouth daily at 6 PM. 11/24/18   Gold, Wayne E, PA-C  ferrous sulfate 325 (65 FE) MG tablet Take 325 mg by mouth See admin instructions. Twice daily with meals on Monday, Wednesday, Friday 10/30/18   [provider]  furosemide   (LASIX ) 40 MG tablet Take 1 tablet (40 mg total) by mouth daily. 12/21/18   Allegra Arch, PA-C  metoprolol  tartrate (LOPRESSOR ) 25 MG tablet Take 1 tablet (25 mg total) by mouth 2 (two) times daily. 01/20/19   Marlyse Single T, PA-C  Multiple Vitamin (MULTIVITAMIN) tablet Take 1 tablet by mouth daily.    [provider]  oxyCODONE  (ROXICODONE ) 15 MG immediate release tablet Take 15 mg by mouth every 8 (eight) hours as needed for pain.    [provider]  pantoprazole  (PROTONIX ) 40 MG tablet Take 40 mg by mouth daily as needed (heartburn).  10/31/18   [provider]  tamsulosin  (FLOMAX ) 0.4 MG CAPS capsule Take 0.4 mg by mouth daily.     [provider]  vitamin B-12 (CYANOCOBALAMIN ) 1000 MCG tablet Take 1,000 mcg by mouth daily.  10/31/18   [provider]  vitamin C (ASCORBIC ACID) 500 MG tablet Take 500 mg by mouth daily.    [provider]      Allergies    Penicillins, Golytely [peg [id number]], and Other    Review of Systems   Review of Systems  Constitutional:  Negative for chills and fever.  Respiratory:  Negative for shortness of breath.   Cardiovascular:  Negative for chest pain.  Gastrointestinal:  Negative for abdominal pain, nausea and vomiting.  Genitourinary:  Negative for  dysuria and flank pain.  Musculoskeletal:  Positive for arthralgias (Left hip pain).  Skin:  Negative for wound.  Neurological:  Negative for dizziness, weakness, numbness and headaches.    Physical Exam Updated Vital Signs BP (!) 178/67   Pulse 72   Temp 98.6 F (37 C) (Oral)   Ht 5\' 11"  (1.803 m)   Wt 59 kg   SpO2 96%   BMI 18.14 kg/m  Physical Exam Vitals and nursing note reviewed.  Constitutional:      General: He is not in acute distress.    Appearance: Normal appearance. He is not ill-appearing or toxic-appearing.  Eyes:     Extraocular Movements: Extraocular movements intact.     Conjunctiva/sclera:  Conjunctivae normal.     Pupils: Pupils are equal, round, and reactive to light.  Cardiovascular:     Rate and Rhythm: Normal rate and regular rhythm.     Pulses: Normal pulses.  Pulmonary:     Effort: Pulmonary effort is normal.  Abdominal:     Palpations: Abdomen is soft.     Tenderness: There is no abdominal tenderness.  Musculoskeletal:        General: Tenderness present. No swelling or deformity.     Left hip: Tenderness and bony tenderness present. No lacerations or crepitus. Decreased range of motion.     Comments: Patient has significant pain with any attempted range of motion of the left leg.  Pain with palpation lateral aspect of the left hip.  Left knee nontender.  No obvious bony deformities.  Skin:    General: Skin is warm.     Capillary Refill: Capillary refill takes less than 2 seconds.  Neurological:     General: No focal deficit present.     Mental Status: He is alert.     ED Results / Procedures / Treatments   Labs (all labs ordered are listed, but only abnormal results are displayed) Labs Reviewed  CBC WITH DIFFERENTIAL/PLATELET - Abnormal; Notable for the following components:      Result Value   Platelets 121 (*)    Lymphs Abs 0.4 (*)    All other components within normal limits  COMPREHENSIVE METABOLIC PANEL WITH GFR - Abnormal; Notable for the following components:   Glucose, Bld 102 (*)    All other components within normal limits  PROTIME-INR  TYPE AND SCREEN    EKG EKG Interpretation Date/Time:  Saturday May 29 2024 11:25:29 EDT Ventricular Rate:  80 PR Interval:  178 QRS Duration:  90 QT Interval:  375 QTC Calculation: 433 R Axis:   90  Text Interpretation: Sinus rhythm Borderline right axis deviation No acute changes No significant change since last tracing Confirmed by Deatra Face ([id number]) on 05/29/2024 12:25:05 PM  Radiology CT Hip Left Wo Contrast Result Date: 05/29/2024 CLINICAL DATA:  Fall with hip pain. EXAM: CT OF THE LEFT HIP  WITHOUT CONTRAST TECHNIQUE: Multidetector CT imaging of the left hip was performed according to the standard protocol. Multiplanar CT image reconstructions were also generated. RADIATION DOSE REDUCTION: This exam was performed according to the departmental dose-optimization program which includes automated exposure control, adjustment of the mA and/or kV according to patient size and/or use of iterative reconstruction technique. COMPARISON:  X-rays from earlier same day FINDINGS: Bones/Joint/Cartilage Left SI joint and left sacrum unremarkable. No left superior or inferior pubic ramus fracture. Acute comminuted intertrochanteric left femoral neck fracture evident with minimal medial impaction. Ligaments Suboptimally assessed by CT. Muscles and Tendons Minimal edema hemorrhage is  seen in the region of the left femoral neck fracture. No discrete hematoma. Soft tissues Mild subcutaneous edema over the left hip. IMPRESSION: Acute comminuted intertrochanteric left femoral neck fracture with minimal medial impaction. Electronically Signed   By: Donnal Fusi M.D.   On: 05/29/2024 12:49   DG Chest Portable 1 View Result Date: 05/29/2024 CLINICAL DATA:  fall, hip pain EXAM: PORTABLE CHEST - 1 VIEW COMPARISON:  January 05, 2019 FINDINGS: Sternotomy wires. Aortic valve replacement. The right costophrenic sulcus is excluded from the field of view. Coarsening of the pulmonary interstitium without overt pulmonary edema. No focal airspace consolidation, pleural effusion, or pneumothorax. No cardiomegaly. Tortuous aorta with aortic atherosclerosis. No acute fracture or destructive lesions. Multilevel thoracic osteophytosis. Radiopaque foreign body overlying the left chest, likely a lighter. IMPRESSION: The right costophrenic sulcus is excluded from the field of view. Otherwise, no acute cardiopulmonary abnormality. Electronically Signed   By: Rance Burrows M.D.   On: 05/29/2024 11:14   DG Hip Unilat With Pelvis 2-3 Views  Left Result Date: 05/29/2024 CLINICAL DATA:  fall, hip pain EXAM: DG HIP (WITH OR WITHOUT PELVIS) 2-3V LEFT COMPARISON:  March 19, 2016 FINDINGS: Diffuse osteopenia. No acute fracture or dislocation. There is no evidence of arthropathy or other focal bone abnormality. Multilevel degenerative disc disease of the spine. Soft tissues are unremarkable. Diffuse peripheral vascular atherosclerosis. IMPRESSION: No acute, displaced fracture or dislocation was visualized. However, underlying osteopenia decreases the sensitivity for nondisplaced fracture using plain radiography. If the patient is unable to bear weight, or there is high clinical suspicion, a follow-up CT should be considered for further evaluation. Electronically Signed   By: Rance Burrows M.D.   On: 05/29/2024 11:11    Procedures Procedures    Medications Ordered in ED Medications  HYDROmorphone (DILAUDID) injection 1 mg (has no administration in time range)  HYDROmorphone (DILAUDID) injection 0.5 mg (0.5 mg Intravenous Given 05/29/24 1155)  ondansetron  (ZOFRAN ) injection 4 mg (4 mg Intravenous Given 05/29/24 1051)    ED Course/ Medical Decision Making/ A&P                                 Medical Decision Making Patient here for evaluation of left hip pain after mechanical fall earlier this morning.  Fell landing on his left hip felt a pop to his hip and was unable to bear weight after the fall.  Denies head injury or LOC.  No neck pain or lower back pain.  Patient has history of prosthetic aortic valve.  Takes BC powders but denies any blood thinners.  Patient has significant pain with any attempted range of motion of the left hip.  I do not appreciate any bony deformities or shortening of the extremity.  Neurovascularly intact.  Clinical suspicion for fracture, dislocation sprain also considered.  Amount and/or Complexity of Data Reviewed Labs: ordered.    Details: Labs no evidence of leukocytosis, chemistries without  derangement, INR 1.1 Radiology: ordered.    Details: X-ray of the left hip shows no acute displaced fracture or dislocation was visualized. Chest x-ray without acute cardiopulmonary process  I clinical suspicion for occult fracture of the hip, CT left hip ordered.  CT shows acute comminuted intratrochanteric left femoral neck fracture with minimal medial impaction ECG/medicine tests: ordered.    Details: Sinus rhythm borderline right axis deviation no significant change from previous Discussion of management or test interpretation with external provider(s): Patient with left  hip fracture, neurovascularly intact.  Consulted on-call orthopedist Dr. Jackee Marus who request patient be admitted to medicine with transfer to [facility] for further care.  Likely surgery tomorrow, may have food and fluids until midnight tonight  Spoke with [facility] hospitalist, Dr. Mason Sole who agrees to admit and arrange for transfer to [facility]  Risk Prescription drug management. Decision regarding hospitalization.           Final Clinical Impression(s) / ED Diagnoses Final diagnoses:  Closed fracture of left hip, initial encounter [facility])    Rx / DC Orders ED Discharge Orders     None         Catherne Clubs, PA-C 05/29/24 1449    Deatra Face, MD 05/30/24 [id number]

## 2024-05-29 NOTE — ED Notes (Signed)
 Pt sitting up at bottom of bed using urinal.

## 2024-05-29 NOTE — Progress Notes (Signed)
 Pt informed this Clinical research associate that he brought his cellphone with him, this Clinical research associate didn't notice any cell phone in bed, & pt checked his personal belongings.and couldn't find it either.[facility] has been informed of he's cellphone whereabouts.

## 2024-05-29 NOTE — ED Triage Notes (Signed)
 Pt arrived via [facility] after falling "while getting bunny out of road". Pt now complains of left hip pain.

## 2024-05-29 NOTE — ED Notes (Signed)
 Went to pt's room to start IV and pt requests privacy to try to use urinal.

## 2024-05-29 NOTE — Progress Notes (Signed)
 Pt arrived to unit via [facility] from [facility], He's alert/oriented in no apparent distress. Welcome guide/menu provided with instructions.and pt verbalized understanding of instructions.  hospital valuables policy has been discussed with no complaints. Orientated to room/call bell/equipments.Hospital bed in lowest position with 3 side rails up, and alarmed set,and all wheels locked.

## 2024-05-30 ENCOUNTER — Encounter (HOSPITAL_COMMUNITY)
Admission: EM | Disposition: A | Discharge status: Home Health | Payer: Self-pay | Source: Home / Self Care | Attending: Internal Medicine

## 2024-05-30 ENCOUNTER — Inpatient Hospital Stay (HOSPITAL_COMMUNITY)

## 2024-05-30 ENCOUNTER — Other Ambulatory Visit: Payer: Self-pay

## 2024-05-30 ENCOUNTER — Inpatient Hospital Stay (HOSPITAL_COMMUNITY): Admitting: Registered Nurse

## 2024-05-30 ENCOUNTER — Encounter (HOSPITAL_COMMUNITY): Payer: Self-pay | Admitting: Internal Medicine

## 2024-05-30 DIAGNOSIS — S72142A Displaced intertrochanteric fracture of left femur, initial encounter for closed fracture: Secondary | ICD-10-CM

## 2024-05-30 DIAGNOSIS — I10 Essential (primary) hypertension: Secondary | ICD-10-CM | POA: Diagnosis not present

## 2024-05-30 DIAGNOSIS — I2510 Atherosclerotic heart disease of native coronary artery without angina pectoris: Secondary | ICD-10-CM | POA: Diagnosis not present

## 2024-05-30 DIAGNOSIS — J449 Chronic obstructive pulmonary disease, unspecified: Secondary | ICD-10-CM | POA: Diagnosis not present

## 2024-05-30 DIAGNOSIS — F17210 Nicotine dependence, cigarettes, uncomplicated: Secondary | ICD-10-CM

## 2024-05-30 HISTORY — PX: INTRAMEDULLARY (IM) NAIL INTERTROCHANTERIC: SHX5875

## 2024-05-30 LAB — BASIC METABOLIC PANEL WITH GFR
Anion gap: 7 (ref 5–15)
BUN: 13 mg/dL (ref 8–23)
CO2: 26 mmol/L (ref 22–32)
Calcium: 8.9 mg/dL (ref 8.9–10.3)
Chloride: 97 mmol/L — ABNORMAL LOW (ref 98–111)
Creatinine, Ser: 1.18 mg/dL (ref 0.61–1.24)
GFR, Estimated: >60 mL/min (ref >60)
Glucose, Bld: 118 mg/dL — ABNORMAL HIGH (ref 70–99)
Potassium: 3.9 mmol/L (ref 3.5–5.1)
Sodium: 130 mmol/L — ABNORMAL LOW (ref 135–145)

## 2024-05-30 LAB — CBC
HCT: 34.6 % — ABNORMAL LOW (ref 39.0–52.0)
Hemoglobin: 12.4 g/dL — ABNORMAL LOW (ref 13.0–17.0)
MCH: 30.6 pg (ref 26.0–34.0)
MCHC: 35.8 g/dL (ref 30.0–36.0)
MCV: 85.4 fL (ref 80.0–100.0)
Platelets: 93 10*3/uL — ABNORMAL LOW (ref 150–400)
RBC: 4.05 MIL/uL — ABNORMAL LOW (ref 4.22–5.81)
RDW: 13 % (ref 11.5–15.5)
WBC: 5.3 10*3/uL (ref 4.0–10.5)
nRBC: 0 % (ref 0.0–0.2)

## 2024-05-30 LAB — MAGNESIUM: Magnesium: 1.9 mg/dL (ref 1.7–2.4)

## 2024-05-30 SURGERY — FIXATION, FRACTURE, INTERTROCHANTERIC, WITH INTRAMEDULLARY ROD
Anesthesia: General | Laterality: Left

## 2024-05-30 MED ORDER — HYDROCODONE-ACETAMINOPHEN 5-325 MG PO TABS
1.0000 | ORAL_TABLET | ORAL | Status: DC | PRN
  Administered 2024-05-30 – 2024-06-01 (×3): 2 via ORAL
  Filled 2024-05-30 (×3): qty 2

## 2024-05-30 MED ORDER — FENTANYL CITRATE (PF) 100 MCG/2ML IJ SOLN
50.0000 ug | Freq: Once | INTRAMUSCULAR | Status: AC
  Administered 2024-05-30 (×2): 25 ug via INTRAVENOUS

## 2024-05-30 MED ORDER — FENTANYL CITRATE (PF) 100 MCG/2ML IJ SOLN
25.0000 ug | INTRAMUSCULAR | Status: DC | PRN
  Administered 2024-05-30 (×3): 50 ug via INTRAVENOUS

## 2024-05-30 MED ORDER — ASPIRIN 81 MG PO TBEC
81.0000 mg | DELAYED_RELEASE_TABLET | Freq: Two times a day (BID) | ORAL | Status: DC
  Administered 2024-05-30 – 2024-06-01 (×4): 81 mg via ORAL
  Filled 2024-05-30 (×4): qty 1

## 2024-05-30 MED ORDER — 0.9 % SODIUM CHLORIDE (POUR BTL) OPTIME
TOPICAL | Status: DC | PRN
  Administered 2024-05-30: 1000 mL

## 2024-05-30 MED ORDER — FENTANYL CITRATE (PF) 100 MCG/2ML IJ SOLN: 25.0000 ug | INTRAMUSCULAR | Status: DC | PRN

## 2024-05-30 MED ORDER — ROCURONIUM BROMIDE 10 MG/ML (PF) SYRINGE
PREFILLED_SYRINGE | INTRAVENOUS | Status: AC
  Filled 2024-05-30: qty 10

## 2024-05-30 MED ORDER — LIDOCAINE 2% (20 MG/ML) 5 ML SYRINGE
INTRAMUSCULAR | Status: AC
  Filled 2024-05-30: qty 5

## 2024-05-30 MED ORDER — VANCOMYCIN HCL 1000 MG IV SOLR
INTRAVENOUS | Status: AC
  Filled 2024-05-30: qty 20

## 2024-05-30 MED ORDER — BUPIVACAINE LIPOSOME 1.3 % IJ SUSP
INTRAMUSCULAR | Status: AC
Start: 2024-05-30 — End: ?
  Filled 2024-05-30: qty 20

## 2024-05-30 MED ORDER — SUGAMMADEX SODIUM 200 MG/2ML IV SOLN
INTRAVENOUS | Status: DC | PRN
  Administered 2024-05-30: 200 mg via INTRAVENOUS

## 2024-05-30 MED ORDER — ONDANSETRON HCL 4 MG/2ML IJ SOLN
INTRAMUSCULAR | Status: DC | PRN
  Administered 2024-05-30: 4 mg via INTRAVENOUS

## 2024-05-30 MED ORDER — ORAL CARE MOUTH RINSE: 15.0000 mL | Freq: Once | OROMUCOSAL | Status: AC

## 2024-05-30 MED ORDER — CHLORHEXIDINE GLUCONATE 0.12 % MT SOLN
15.0000 mL | Freq: Once | OROMUCOSAL | Status: AC
  Administered 2024-05-30: 15 mL via OROMUCOSAL

## 2024-05-30 MED ORDER — LABETALOL HCL 5 MG/ML IV SOLN
INTRAVENOUS | Status: AC
  Filled 2024-05-30: qty 4

## 2024-05-30 MED ORDER — FENTANYL CITRATE PF 50 MCG/ML IJ SOSY
50.0000 ug | PREFILLED_SYRINGE | Freq: Once | INTRAMUSCULAR | Status: AC
  Administered 2024-05-30: 50 ug via INTRAVENOUS
  Filled 2024-05-30: qty 1

## 2024-05-30 MED ORDER — VANCOMYCIN HCL 1000 MG IV SOLR
INTRAVENOUS | Status: DC | PRN
  Administered 2024-05-30: 1000 mg via INTRAVENOUS

## 2024-05-30 MED ORDER — FENTANYL CITRATE (PF) 100 MCG/2ML IJ SOLN
INTRAMUSCULAR | Status: AC
  Filled 2024-05-30: qty 2

## 2024-05-30 MED ORDER — LACTATED RINGERS IV SOLN: INTRAVENOUS | Status: DC

## 2024-05-30 MED ORDER — BACITRACIN ZINC 500 UNIT/GM EX OINT
TOPICAL_OINTMENT | CUTANEOUS | Status: AC
Start: 2024-05-30 — End: ?
  Filled 2024-05-30: qty 28.35

## 2024-05-30 MED ORDER — ACETAMINOPHEN 10 MG/ML IV SOLN
INTRAVENOUS | Status: AC
  Filled 2024-05-30: qty 100

## 2024-05-30 MED ORDER — METHOCARBAMOL 500 MG PO TABS
500.0000 mg | ORAL_TABLET | Freq: Four times a day (QID) | ORAL | Status: DC | PRN
  Administered 2024-05-30: 1000 mg via ORAL
  Administered 2024-05-31: 500 mg via ORAL
  Filled 2024-05-30: qty 1
  Filled 2024-05-30: qty 2

## 2024-05-30 MED ORDER — PROPOFOL 10 MG/ML IV BOLUS
INTRAVENOUS | Status: AC
  Filled 2024-05-30: qty 20

## 2024-05-30 MED ORDER — FENTANYL CITRATE (PF) 250 MCG/5ML IJ SOLN
INTRAMUSCULAR | Status: DC | PRN
  Administered 2024-05-30 (×3): 50 ug via INTRAVENOUS

## 2024-05-30 MED ORDER — LABETALOL HCL 5 MG/ML IV SOLN
INTRAVENOUS | Status: DC | PRN
  Administered 2024-05-30 (×2): 5 mg via INTRAVENOUS

## 2024-05-30 MED ORDER — FENTANYL CITRATE (PF) 250 MCG/5ML IJ SOLN
INTRAMUSCULAR | Status: AC
  Filled 2024-05-30: qty 5

## 2024-05-30 MED ORDER — DEXAMETHASONE SODIUM PHOSPHATE 10 MG/ML IJ SOLN
INTRAMUSCULAR | Status: DC | PRN
  Administered 2024-05-30: 10 mg via INTRAVENOUS

## 2024-05-30 MED ORDER — PROPOFOL 10 MG/ML IV BOLUS
INTRAVENOUS | Status: DC | PRN
  Administered 2024-05-30: 50 mg via INTRAVENOUS
  Administered 2024-05-30: 100 mg via INTRAVENOUS

## 2024-05-30 MED ORDER — LIDOCAINE 2% (20 MG/ML) 5 ML SYRINGE
INTRAMUSCULAR | Status: DC | PRN
  Administered 2024-05-30: 40 mg via INTRAVENOUS

## 2024-05-30 MED ORDER — ROCURONIUM BROMIDE 10 MG/ML (PF) SYRINGE
PREFILLED_SYRINGE | INTRAVENOUS | Status: DC | PRN
  Administered 2024-05-30: 10 mg via INTRAVENOUS
  Administered 2024-05-30: 40 mg via INTRAVENOUS

## 2024-05-30 MED ORDER — DEXAMETHASONE SODIUM PHOSPHATE 10 MG/ML IJ SOLN
INTRAMUSCULAR | Status: AC
  Filled 2024-05-30: qty 1

## 2024-05-30 MED ORDER — ONDANSETRON HCL 4 MG/2ML IJ SOLN
INTRAMUSCULAR | Status: AC
  Filled 2024-05-30: qty 2

## 2024-05-30 MED ORDER — ACETAMINOPHEN 10 MG/ML IV SOLN
INTRAVENOUS | Status: DC | PRN
  Administered 2024-05-30: 1000 mg via INTRAVENOUS

## 2024-05-30 MED ORDER — FAMOTIDINE 20 MG PO TABS
20.0000 mg | ORAL_TABLET | Freq: Every day | ORAL | Status: DC
  Administered 2024-05-30 – 2024-06-01 (×3): 20 mg via ORAL
  Filled 2024-05-30 (×3): qty 1

## 2024-05-30 SURGICAL SUPPLY — 36 items
BAG COUNTER SPONGE SURGICOUNT (BAG) ×1 IMPLANT
BIT DRILL INTERTAN LAG SCREW (BIT) IMPLANT
BIT DRILL LONG 4.0 (BIT) IMPLANT
BLADE SURG 15 STRL LF DISP TIS (BLADE) ×1 IMPLANT
BNDG COHESIVE 4X5 TAN STRL LF (GAUZE/BANDAGES/DRESSINGS) IMPLANT
COVER PERINEAL POST (MISCELLANEOUS) ×1 IMPLANT
COVER SURGICAL LIGHT HANDLE (MISCELLANEOUS) ×1 IMPLANT
DRAPE HALF SHEET 40X57 (DRAPES) ×2 IMPLANT
DRESSING MEPILEX FLEX 4X4 (GAUZE/BANDAGES/DRESSINGS) ×1 IMPLANT
DRSG ADAPTIC 3X8 NADH LF (GAUZE/BANDAGES/DRESSINGS) ×1 IMPLANT
DRSG MEPILEX POST OP 4X8 (GAUZE/BANDAGES/DRESSINGS) ×1 IMPLANT
DURAPREP 26ML APPLICATOR (WOUND CARE) ×1 IMPLANT
ELECTRODE REM PT RTRN 9FT ADLT (ELECTROSURGICAL) ×1 IMPLANT
EVACUATOR 1/8 PVC DRAIN (DRAIN) IMPLANT
GLOVE BIO SURGEON STRL SZ 6.5 (GLOVE) ×1 IMPLANT
GLOVE BIO SURGEON STRL SZ8 (GLOVE) ×1 IMPLANT
GLOVE BIOGEL PI IND STRL 7.0 (GLOVE) ×1 IMPLANT
GLOVE BIOGEL PI IND STRL 8 (GLOVE) ×1 IMPLANT
GLOVE SURG ENC MOIS LTX SZ6.5 (GLOVE) ×1 IMPLANT
GOWN STRL REUS W/ TWL LRG LVL3 (GOWN DISPOSABLE) ×2 IMPLANT
GOWN STRL REUS W/ TWL XL LVL3 (GOWN DISPOSABLE) ×1 IMPLANT
KIT BASIN OR (CUSTOM PROCEDURE TRAY) ×1 IMPLANT
KIT TURNOVER KIT B (KITS) ×1 IMPLANT
MANIFOLD NEPTUNE II (INSTRUMENTS) ×1 IMPLANT
NAIL TRIGEN INTERTAN 10X18CM (Nail) IMPLANT
NS IRRIG 1000ML POUR BTL (IV SOLUTION) ×1 IMPLANT
PACK GENERAL/GYN (CUSTOM PROCEDURE TRAY) ×1 IMPLANT
PAD ARMBOARD POSITIONER FOAM (MISCELLANEOUS) ×2 IMPLANT
PIN GUIDE 3.2X343MM (PIN) IMPLANT
ROD GUIDE 3.0 (MISCELLANEOUS) IMPLANT
SCREW LAG COMPR KIT 90/85 (Screw) IMPLANT
SCREW TRIGEN LOW PROF 5.0X35 (Screw) IMPLANT
STAPLER SKIN PROX 35W (STAPLE) IMPLANT
SUT VIC AB 0 CT1 27XBRD ANBCTR (SUTURE) IMPLANT
SUT VIC AB 2-0 CTB1 (SUTURE) IMPLANT
WATER STERILE IRR 1000ML POUR (IV SOLUTION) ×2 IMPLANT

## 2024-05-30 NOTE — Care Management (Signed)
 Encounter submitted through [place] portal  Confirmation number [id number]

## 2024-05-30 NOTE — Hospital Course (Addendum)
 78 yom w/ chronic pain/PTSD, COPD with active tobacco abuse, bioprosthetic AVR, CAD, hypertension, GERD, and BPH who presented to the ED with complaints of hip pain after mechanical fall  In the ED vitals stable afebrile, labs generally stable except for thrombocytopenia 121 X-rays are unremarkable, but CT shows acute comminuted left femoral neck fracture. Orthopedic was consulted and transferred to [facility]:  Subjective: Seen and examined Some left hip pain no other new complaints Overnight afebrile, BP stable. Labs with further drop in platelet count.  Assessment/Plan   Acute left femoral neck fracture secondary to mechanical fall: Ortho on board for operative intervention  continue PT OT pain management DVT prophylaxis per orthopedics EKG reviewed from ED, NSR no acute changes.  At baseline no chest pain shortness of breath with mobility-is independent. Reports he had his recent echocardiogram few months ago and [facility]-I am unable to see the record Patient is going for moderate risk surgery.   Thrombocytopenia: Appears to be chronic.  Monitor  CAD: Stable cont  aspirin  and statin. Patient had a cath in 2019 nonobstructive disease Last echo 2019 with normal EF 60 to 65%.  Normally functioning aortic valve prosthesis. At baseline he is very functional he claims himself -" I feel like 78 year old" he has no shortness of breath chest pain dyspnea with activity-independent and ambulatory at home.   Hypertension Bp stable, on metoprolol  and amlodipine    BPH Continue tamsulosin    GERD Cont PPI   COPD Cont prn nebs   History of anemia Stable cont  iron supplementation   Chronic pain Pain management with IV and p.o. medications   Tobacco abuse  Smokes about 1 pack/day, counseled on cessation  Right lower lobe 12 x 11 mm scarring on  screening CT scan from 05/07/2024:  Increased/new  from CT on 01/29/23, this isand he has a short-term follow-up with CT recommended in 6  months

## 2024-05-30 NOTE — Transfer of Care (Signed)
 Immediate Anesthesia Transfer of Care Note  Patient: Jonathan [facility]  Procedure(s) Performed: FIXATION, FRACTURE, INTERTROCHANTERIC, WITH INTRAMEDULLARY ROD (Left)  Patient Location: PACU  Anesthesia Type:General  Level of Consciousness: drowsy and patient cooperative  Airway & Oxygen Therapy: Patient connected to face mask oxygen  Post-op Assessment: Report given to RN and Post -op Vital signs reviewed and stable  Post vital signs: Reviewed and stable  Last Vitals:  Vitals Value Taken Time  BP 135/108 05/30/24 1408  Temp    Pulse 83 05/30/24 1411  Resp 18 05/30/24 1411  SpO2 99 % 05/30/24 1411  Vitals shown include unfiled device data.  Last Pain:  Vitals:   05/30/24 1221  TempSrc:   PainSc: 10-Worst pain ever         Complications: There were no known notable events for this encounter.

## 2024-05-30 NOTE — Progress Notes (Signed)
    Patient underwent intramedullary fixation of intertrochanteric L hip fx uneventfully in OR. Incisions covered with mepalex dressing, keep dry.   -[facility]AT -PT eval, will likely require crutches vs walker -ASA 81mg  BID DVT prophylaxis x30 days -Hydrocodone  for pain and Robaxin PRN spasm -D/C home pending clearance by PT and Hospitalist team  -F/U in ortho office 10-14 days for dressing and staples removal   [employer] [phone] [address] [place], [place] [address]

## 2024-05-30 NOTE — Anesthesia Procedure Notes (Signed)
 Procedure Name: Intubation Date/Time: 05/30/2024 12:44 PM  Performed by: Jamas Maywood, CRNAPre-anesthesia Checklist: Patient identified, Emergency Drugs available, Suction available and Patient being monitored Patient Re-evaluated:Patient Re-evaluated prior to induction Oxygen Delivery Method: Circle system utilized Preoxygenation: Pre-oxygenation with 100% oxygen Induction Type: IV induction Ventilation: Mask ventilation without difficulty and Oral airway inserted - appropriate to patient size Laryngoscope Size: Mac and 4 Grade View: Grade I Tube type: Oral Tube size: 7.5 mm Number of attempts: 1 Airway Equipment and Method: Stylet and Oral airway Placement Confirmation: ETT inserted through vocal cords under direct vision, positive ETCO2 and breath sounds checked- equal and bilateral Secured at: 22 cm Tube secured with: Tape Dental Injury: Teeth and Oropharynx as per pre-operative assessment

## 2024-05-30 NOTE — Anesthesia Postprocedure Evaluation (Signed)
 Anesthesia Post Note  Patient: Jonathan [facility]  Procedure(s) Performed: FIXATION, FRACTURE, INTERTROCHANTERIC, WITH INTRAMEDULLARY ROD (Left)     Patient location during evaluation: PACU Anesthesia Type: General Level of consciousness: awake and alert Pain management: pain level controlled Vital Signs Assessment: post-procedure vital signs reviewed and stable Respiratory status: spontaneous breathing, nonlabored ventilation, respiratory function stable and patient connected to nasal cannula oxygen Cardiovascular status: blood pressure returned to baseline and stable Postop Assessment: no apparent nausea or vomiting Anesthetic complications: no  There were no known notable events for this encounter.  Last Vitals:  Vitals:   05/30/24 1545 05/30/24 1648  BP: (!) 142/67 131/71  Pulse: 73 80  Resp: 18   Temp: 36.8 C 36.9 C  SpO2: 94% 97%    Last Pain:  Vitals:   05/30/24 1648  TempSrc: Oral  PainSc:                  Cova Knieriem D Kiyara Bouffard

## 2024-05-30 NOTE — Plan of Care (Signed)

## 2024-05-30 NOTE — H&P (Signed)
 Jonathan Holland is an 78 y.o. male.  HPI: Patient is a 78 year old male who did sustain a fall from a standing height yesterday.  He did present to the emergency department with left hip pain.  I was called to evaluate him.  He currently describes pain as a stabbing sensation in the region of his left hip.  His pain is increased with any passive range of motion involving the left hip.  His pain is moderate to severe.  He denies any other significant pain in any other area.       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    LIMA to LAD   SOB (shortness of breath)     Tobacco abuse 03/26/2016               Past Surgical History:  Procedure Laterality Date    tonsilectomy as a child       ADENOIDECTOMY       AORTIC VALVE REPLACEMENT N/A 11/19/2018    Procedure: AORTIC VALVE REPLACEMENT (AVR) using a 21mm Edwards Inspiris Aortic Valve;  Surgeon: Gardenia Jump, MD;  Location: [facility] OR;  Service: Open Heart Surgery;  Laterality: N/A;   CARDIAC CATHETERIZATION   10/29/2018    done at [facility]   CORONARY ARTERY BYPASS GRAFT N/A 11/19/2018    Procedure: CORONARY ARTERY BYPASS GRAFTING (CABG) x1 using the left internal mammary artery;  Surgeon: Gardenia Jump, MD;  Location: [facility] OR;  Service: Open Heart Surgery;  Laterality: N/A;   HERNIA REPAIR       SHOULDER ARTHROSCOPY       TEE WITHOUT CARDIOVERSION N/A  11/19/2018    Procedure: TRANSESOPHAGEAL ECHOCARDIOGRAM (TEE);  Surgeon: Gardenia Jump, MD;  Location: [facility] OR;  Service: Open Heart Surgery;  Laterality: N/A;   TONSILLECTOMY                   Family History  Problem Relation Age of Onset   Congestive Heart Failure Father     Hyperlipidemia Father     Hypertension Father     Heart disease Father            Social History:  reports that he has been smoking cigarettes. He started smoking about 60 years ago. He has a 27.4 pack-year smoking history. He has never used smokeless tobacco. He reports that he does not drink alcohol and does not use drugs.   Allergies:  Allergies       Allergies  Allergen Reactions   Other Anaphylaxis and Other (See Comments)      Medication used prior to colonoscopy- Pt states it feels like "gasoline was poured on him", throat swelling   Penicillins Swelling, Rash and Other (See Comments)      Immediate  rash, facial/tongue/throat swelling, SOB or lightheadedness with hypotension Severe rash involving mucus membranes or skin necrosis   Golytely [Peg [id number]-Nacl-Nasulf] Rash        Medications: I have reviewed the patient's current medications.   Lab Results Last 48 Hours        Results for orders placed or performed during the hospital encounter of 05/29/24 (from the past 48 hours)  CBC with Differential     Status: Abnormal    Collection Time: 05/29/24  9:51 AM  Result Value Ref Range    WBC 6.3 4.0 - 10.5 K/uL    RBC 4.40 4.22 - 5.81 MIL/uL    Hemoglobin 13.1 13.0 - 17.0 g/dL    HCT 39.7 67.3 - 41.9 %    MCV 89.3 80.0 - 100.0 fL    MCH 29.8 26.0 - 34.0 pg    MCHC 33.3 30.0 - 36.0 g/dL    RDW 37.9 02.4 - 09.7 %    Platelets 121 (L) 150 - 400 K/uL    nRBC 0.0 0.0 - 0.2 %    Neutrophils Relative % 86 %    Neutro Abs 5.4 1.7 - 7.7 K/uL    Lymphocytes Relative 6 %    Lymphs Abs 0.4 (L) 0.7 - 4.0 K/uL    Monocytes Relative 5 %    Monocytes Absolute 0.3 0.1 - 1.0 K/uL     Eosinophils Relative 1 %    Eosinophils Absolute 0.1 0.0 - 0.5 K/uL    Basophils Relative 1 %    Basophils Absolute 0.0 0.0 - 0.1 K/uL    Immature Granulocytes 1 %    Abs Immature Granulocytes 0.03 0.00 - 0.07 K/uL      Comment: Performed at [facility], [address], [place], [place] [address]  Protime-INR     Status: None    Collection Time: 05/29/24  9:51 AM  Result Value Ref Range    Prothrombin Time 14.2 11.4 - 15.2 seconds    INR 1.1 0.8 - 1.2      Comment: (NOTE) INR goal varies based on device and disease states. Performed at [facility], [address], [place], [place] [address]    Comprehensive metabolic panel     Status: Abnormal    Collection Time: 05/29/24  9:51 AM  Result Value Ref Range    Sodium 135 135 - 145 mmol/L    Potassium 3.6 3.5 - 5.1 mmol/L    Chloride 101 98 - 111 mmol/L    CO2 25 22 - 32 mmol/L    Glucose, Bld 102 (H) 70 - 99 mg/dL      Comment: Glucose reference range applies only to samples taken after fasting for at least 8 hours.    BUN 12 8 - 23 mg/dL    Creatinine, Ser 8.34 0.61 - 1.24 mg/dL    Calcium  9.1 8.9 - 10.3 mg/dL    Total Protein 6.9 6.5 - 8.1 g/dL    Albumin  3.9 3.5 - 5.0 g/dL    AST 16 15 - 41 U/L    ALT 16 0 - 44 U/L    Alkaline Phosphatase 68 38 - 126 U/L    Total Bilirubin 0.7 0.0 - 1.2 mg/dL    GFR, Estimated >19 >62 mL/min      Comment: (NOTE) Calculated using the CKD-EPI Creatinine Equation (2021)      Anion gap 9 5 - 15      Comment: Performed at [facility], [address], [place],  [place] [address]  Type and screen [facility]     Status: None    Collection Time: 05/29/24  9:52 AM  Result Value Ref Range    ABO/RH(D) O POS      Antibody Screen NEG      Sample Expiration          06/01/2024,2359 Performed at [facility], [address], [place], [place] [address]    Surgical pcr screen     Status: None    Collection Time: 05/29/24  5:37 PM    Specimen: Nasal Mucosa; Nasal Swab  Result Value Ref Range     MRSA, PCR NEGATIVE NEGATIVE    Staphylococcus aureus NEGATIVE NEGATIVE      Comment: (NOTE) The Xpert SA Assay (FDA approved for NASAL specimens in patients 29 years of age and older), is one component of a comprehensive surveillance program. It is not intended to diagnose infection nor to guide or monitor treatment. Performed at [facility], [address] [place], [place] [address]    Magnesium      Status: None    Collection Time: 05/30/24  5:41 AM  Result Value Ref Range    Magnesium  1.9 1.7 - 2.4 mg/dL      Comment: Performed at [facility], [address], [place], [place] [address]  Basic metabolic panel     Status: Abnormal    Collection Time: 05/30/24  5:41 AM  Result Value Ref Range    Sodium 130 (L) 135 - 145 mmol/L    Potassium 3.9 3.5 - 5.1 mmol/L    Chloride 97 (L) 98 - 111 mmol/L    CO2 26 22 - 32 mmol/L    Glucose, Bld 118 (H) 70 - 99 mg/dL      Comment: Glucose reference range applies only to samples taken after fasting for at least 8 hours.    BUN 13 8 - 23 mg/dL    Creatinine, Ser 5.95 0.61 - 1.24 mg/dL    Calcium  8.9 8.9 - 10.3 mg/dL    GFR, Estimated >63 >87 mL/min      Comment: (NOTE) Calculated using the CKD-EPI Creatinine Equation (2021)      Anion gap 7 5 - 15      Comment: Performed at [facility], [address] [place], [place] [address]  CBC     Status: Abnormal    Collection Time: 05/30/24  5:41 AM  Result Value Ref Range    WBC 5.3 4.0 - 10.5 K/uL    RBC 4.05 (L) 4.22 - 5.81 MIL/uL    Hemoglobin 12.4 (L) 13.0 - 17.0 g/dL    HCT 29.5 (L) 18.8 - 52.0 %    MCV 85.4 80.0 - 100.0 fL    MCH 30.6 26.0 - 34.0 pg    MCHC 35.8 30.0 - 36.0 g/dL    RDW 41.6 60.6 - 30.1 %    Platelets 93 (L) 150 - 400 K/uL      Comment: Immature Platelet Fraction may be clinically indicated, consider ordering this additional test [id number] REPEATED TO VERIFY      nRBC 0.0 0.0 - 0.2 %      Comment: Performed at [facility], [address], [place], [place] [address]         Imaging Results (Last 48 hours)  CT Hip Left Wo Contrast Result Date: 05/29/2024 CLINICAL DATA:  Fall with hip pain. EXAM: CT OF THE LEFT HIP WITHOUT  CONTRAST TECHNIQUE: Multidetector CT imaging of the left hip was performed according to the standard protocol. Multiplanar CT image reconstructions were also generated. RADIATION DOSE REDUCTION: This exam was performed according to the departmental dose-optimization program which includes automated exposure control, adjustment of the mA and/or kV according to patient size and/or use of iterative reconstruction technique. COMPARISON:  X-rays from earlier same day FINDINGS: Bones/Joint/Cartilage Left SI joint and left sacrum unremarkable. No left superior or inferior pubic ramus fracture. Acute comminuted intertrochanteric left femoral neck fracture evident with minimal medial impaction. Ligaments Suboptimally assessed by CT. Muscles and Tendons Minimal edema hemorrhage is seen in the region of the left femoral neck fracture. No discrete hematoma. Soft tissues Mild subcutaneous edema over the left hip. IMPRESSION: Acute comminuted intertrochanteric left femoral neck fracture with minimal medial impaction. Electronically Signed   By: Donnal Fusi M.D.   On: 05/29/2024 12:49    DG Chest Portable 1 View Result Date: 05/29/2024 CLINICAL DATA:  fall, hip pain EXAM: PORTABLE CHEST - 1 VIEW COMPARISON:  January 05, 2019 FINDINGS: Sternotomy wires. Aortic valve replacement. The right costophrenic sulcus is excluded from the field of view. Coarsening of the pulmonary interstitium without overt pulmonary edema. No focal airspace consolidation, pleural effusion, or pneumothorax. No cardiomegaly. Tortuous aorta with aortic atherosclerosis. No acute fracture or destructive lesions. Multilevel thoracic osteophytosis. Radiopaque foreign body overlying the left chest, likely a lighter. IMPRESSION: The right costophrenic sulcus is  excluded from the field of view. Otherwise, no acute cardiopulmonary abnormality. Electronically Signed   By: Rance Burrows M.D.   On: 05/29/2024 11:14    DG Hip Unilat With Pelvis 2-3 Views Left Result Date: 05/29/2024 CLINICAL DATA:  fall, hip pain EXAM: DG HIP (WITH OR WITHOUT PELVIS) 2-3V LEFT COMPARISON:  March 19, 2016 FINDINGS: Diffuse osteopenia. No acute fracture or dislocation. There is no evidence of arthropathy or other focal bone abnormality. Multilevel degenerative disc disease of the spine. Soft tissues are unremarkable. Diffuse peripheral vascular atherosclerosis. IMPRESSION: No acute, displaced fracture or dislocation was visualized. However, underlying osteopenia decreases the sensitivity for nondisplaced fracture using plain radiography. If the patient is unable to bear weight, or there is high clinical suspicion, a follow-up CT should be considered for further evaluation. Electronically Signed   By: Rance Burrows M.D.   On: 05/29/2024 11:11       Review of Systems  Constitutional: Negative.   HENT: Negative.    Respiratory: Negative.    Gastrointestinal: Negative.   Genitourinary: Negative.   Psychiatric/Behavioral: Negative.      Blood pressure (!) 149/66, pulse (!) 55, temperature 97.9 F (36.6 C), temperature source Oral, resp. rate 12, height 5' 10.98" (1.803 m), weight 59 kg, SpO2 97%. Physical Exam Constitutional:      Appearance: He is normal weight.  HENT:     Head: Normocephalic and atraumatic.  Cardiovascular:     Rate and Rhythm: Normal rate.     Pulses: Normal pulses.  Abdominal:     General: Abdomen is flat.     Palpations: Abdomen is soft.  Musculoskeletal:        General: Tenderness present.     Cervical back: Normal range of motion and neck supple.  Skin:    General: Skin is warm and dry.     Capillary Refill: Capillary refill takes less than 2 seconds.  Neurological:     General: No focal deficit present.     Mental Status: He is alert.   Psychiatric:  Mood and Affect: Mood normal.        Assessment/Plan: Patient was noted to have intertrochanteric left hip fracture.  I discussed with the patient the need for surgical intervention, in order to provide get everything back in ambulation related as understand that this would be of an intramedullary device to the left hip.  He does understand the risk of chafing, including malodor, malunion, neurovascular injury, and persistent pain, and the possible need for additional surgery.  After full understanding of the risks and benefits of surgery, the patient does wish to proceed.  We will go ahead and get surgery arranged at his earliest convenience.   Dasan Hardman L Kariana Wiles 05/30/2024, 9:38 AM

## 2024-05-30 NOTE — Op Note (Signed)
 PATIENT NAME: Jonathan Holland   MEDICAL RECORD NO.:   [id number]    DATE OF BIRTH: 1946-11-02   DATE OF PROCEDURE: 05/30/2024                                OPERATIVE REPORT     PREOPERATIVE DIAGNOSES: 1.  Intertrochanteric fracture of left hip   POSTOPERATIVE DIAGNOSES: 1.  Intertrochanteric fracture of left hip   PROCEDURE: Closed reduction and intramedullary fixation of intertrochanteric left hip fracture   SURGEON:  Virl Grimes, MD   ASSISTANT:  Geraline Knapp, PA-C.   ANESTHESIA:  General endotracheal anesthesia.   COMPLICATIONS:  None.   DISPOSITION:  Stable.   ESTIMATED BLOOD LOSS:  Minimal.   INDICATIONS FOR SURGERY:  Briefly, [name] is a pleasant 78 -year- old male, who fell from a standing height yesterday evening.  He did present to the emergency department, and was diagnosed with a left hip fracture, and was admitted to the medicine service.  I was contacted to evaluate him.  He was noted to have the fracture outlined above.  I did discuss with the patient the need for surgical intervention, in order to restore ambulation.  The risks and benefits of surgery were discussed in detail, and he did wish to proceed.   OPERATIVE DETAILS:  On 05/30/2024, the patient was brought to surgery and general endotracheal anesthesia was administered.  The patient was placed supine on a Hana bed.  The feet of both extremities were placed in boots.  Traction was gently applied to the left lower extremity, and the right nonoperative lower extremity was extended towards the floor to optimize visualization of the operative left hip.  Under fluoroscopy, a reduction maneuver was performed, applying traction and internal rotation.  Excellent anatomic alignment was identified.  At this point, the left hip was prepped and draped in the usual sterile fashion, and a timeout procedure was performed.  Antibiotics were given preoperatively.  An incision was then made proximal to the greater  trochanter.  A guidewire was placed at the tip of the greater trochanter, using both AP and lateral fluoroscopy.  I was pleased with the positioning of the guidewire.  I then drilled over the guidewire using an entry reamer.  At this point, a ball-tipped guidewire was advanced through the entry hole, across the fracture site, and into the mid femoral shaft.  The intramedullary nail was then advanced over the guidewire.  At this point, a more distal incision was made in anticipation of the lag and compression screws.  A guidewire was advanced through the nail into the femoral head.  The appropriate length lag screw was inserted over the guidewire, through the nail, and into the femoral head.  I was very pleased with the resting position on both AP and lateral images.  The compression screw was then placed.  A 90 mm lag screw, an 85 mm compression screw was selected.  Using the jig, a distal locking screw was advanced from the lateral position, through the nail.  A 35 x 5 mm screw was chosen.  I was very pleased with the resting position.  I was very pleased with the final AP and lateral images.  The wounds were then copiously irrigated, and closed with #1 Vicryl, followed by 2-0 Vicryl, followed by staples.   All instrument counts were correct at the termination of the procedure.   Of note, Kayla McKenzie, PA-C,  was my assistant throughout surgery, and did aid in retraction, placement of the hardware, suctioning, and closure from start to finish.     Virl Grimes, MD

## 2024-05-30 NOTE — Anesthesia Preprocedure Evaluation (Addendum)
 Anesthesia Evaluation  Patient identified by MRN, date of birth, ID band Patient awake    Reviewed: Allergy & Precautions, NPO status , Patient's Chart, lab work & pertinent test results  Airway Mallampati: I  TM Distance: >3 FB Neck ROM: Full    Dental  (+) Edentulous Upper, Edentulous Lower   Pulmonary COPD, Current Smoker    + decreased breath sounds      Cardiovascular hypertension, + CAD, + CABG and + DOE  + Valvular Problems/Murmurs  Rhythm:Regular Rate:Normal  - AS s/p AVR   Neuro/Psych  PSYCHIATRIC DISORDERS Anxiety     negative neurological ROS     GI/Hepatic Neg liver ROS,GERD  ,,  Endo/Other  negative endocrine ROS    Renal/GU negative Renal ROS     Musculoskeletal  (+) Arthritis ,    Abdominal   Peds  Hematology  (+) Blood dyscrasia, anemia   Anesthesia Other Findings   Reproductive/Obstetrics                             Anesthesia Physical Anesthesia Plan  ASA: 3  Anesthesia Plan: General   Post-op Pain Management: Ofirmev  IV (intra-op)*   Induction: Intravenous  PONV Risk Score and Plan: 2 and Ondansetron  and Treatment may vary due to age or medical condition  Airway Management Planned: Oral ETT  Additional Equipment: None  Intra-op Plan:   Post-operative Plan: Extubation in OR  Informed Consent: I have reviewed the patients History and Physical, chart, labs and discussed the procedure including the risks, benefits and alternatives for the proposed anesthesia with the patient or authorized representative who has indicated his/her understanding and acceptance.     Dental advisory given  Plan Discussed with: CRNA  Anesthesia Plan Comments:        Anesthesia Quick Evaluation

## 2024-05-30 NOTE — Consult Note (Signed)
 Reason for Consult:Left hip pain Referring Physician: Dr. Keene Pastures Jonathan Holland is an 78 y.o. male.  HPI: Patient is a 78 year old male who did sustain a fall from a standing height yesterday.  He did present to the emergency department with left hip pain.  I was called to evaluate him.  He currently describes pain as a stabbing sensation in the region of his left hip.  His pain is increased with any passive range of motion involving the left hip.  His pain is moderate to severe.  He denies any other significant pain in any other area.  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   LIMA to LAD   SOB (shortness of breath)    Tobacco abuse 03/26/2016    Past Surgical History:  Procedure Laterality Date    tonsilectomy as a child     ADENOIDECTOMY     AORTIC VALVE REPLACEMENT N/A 11/19/2018   Procedure: AORTIC VALVE REPLACEMENT (AVR) using a 21mm Edwards Inspiris Aortic Valve;  Surgeon: Gardenia Jump, MD;  Location: [facility] OR;  Service: Open Heart Surgery;  Laterality: N/A;   CARDIAC CATHETERIZATION  10/29/2018   done at [facility]   CORONARY ARTERY BYPASS GRAFT N/A 11/19/2018   Procedure: CORONARY ARTERY BYPASS GRAFTING (CABG) x1 using the left internal mammary artery;  Surgeon: Gardenia Jump, MD;  Location: [facility] OR;  Service: Open Heart Surgery;  Laterality: N/A;   HERNIA REPAIR     SHOULDER ARTHROSCOPY     TEE WITHOUT  CARDIOVERSION N/A 11/19/2018   Procedure: TRANSESOPHAGEAL ECHOCARDIOGRAM (TEE);  Surgeon: Gardenia Jump, MD;  Location: [facility] OR;  Service: Open Heart Surgery;  Laterality: N/A;   TONSILLECTOMY      Family History  Problem Relation Age of Onset   Congestive Heart Failure Father    Hyperlipidemia Father    Hypertension Father    Heart disease Father     Social History:  reports that he has been smoking cigarettes. He started smoking about 60 years ago. He has a 27.4 pack-year smoking history. He has never used smokeless tobacco. He reports that he does not drink alcohol and does not use drugs.  Allergies:  Allergies  Allergen Reactions   Other Anaphylaxis and Other (See Comments)    Medication used prior to colonoscopy- Pt states it feels like "gasoline was poured on him", throat swelling   Penicillins Swelling, Rash and Other (See Comments)    Immediate rash, facial/tongue/throat swelling, SOB or lightheadedness with hypotension Severe rash involving mucus membranes or skin necrosis   Golytely [Peg [id number]-Nacl-Nasulf] Rash    Medications: I have reviewed the patient's current medications.  Results for orders placed or performed during the hospital encounter of 05/29/24 (from the past 48 hours)  CBC with Differential     Status: Abnormal   Collection Time: 05/29/24  9:51  AM  Result Value Ref Range   WBC 6.3 4.0 - 10.5 K/uL   RBC 4.40 4.22 - 5.81 MIL/uL   Hemoglobin 13.1 13.0 - 17.0 g/dL   HCT 16.1 09.6 - 04.5 %   MCV 89.3 80.0 - 100.0 fL   MCH 29.8 26.0 - 34.0 pg   MCHC 33.3 30.0 - 36.0 g/dL   RDW 40.9 81.1 - 91.4 %   Platelets 121 (L) 150 - 400 K/uL   nRBC 0.0 0.0 - 0.2 %   Neutrophils Relative % 86 %   Neutro Abs 5.4 1.7 - 7.7 K/uL   Lymphocytes Relative 6 %   Lymphs Abs 0.4 (L) 0.7 - 4.0 K/uL   Monocytes Relative 5 %   Monocytes Absolute 0.3 0.1 - 1.0 K/uL   Eosinophils Relative 1 %   Eosinophils Absolute 0.1 0.0 - 0.5 K/uL   Basophils Relative 1 %    Basophils Absolute 0.0 0.0 - 0.1 K/uL   Immature Granulocytes 1 %   Abs Immature Granulocytes 0.03 0.00 - 0.07 K/uL    Comment: Performed at [facility], [address], [place], [place] [address]  Protime-INR     Status: None   Collection Time: 05/29/24  9:51 AM  Result Value Ref Range   Prothrombin Time 14.2 11.4 - 15.2 seconds   INR 1.1 0.8 - 1.2    Comment: (NOTE) INR goal varies based on device and disease states. Performed at [facility], [address], [place], [place] [address]   Comprehensive metabolic panel     Status: Abnormal   Collection Time: 05/29/24  9:51 AM  Result Value Ref Range   Sodium 135 135 - 145 mmol/L   Potassium 3.6 3.5 - 5.1 mmol/L   Chloride 101 98 - 111 mmol/L   CO2 25 22 - 32 mmol/L   Glucose, Bld 102 (H) 70 - 99 mg/dL    Comment: Glucose reference range applies only to samples taken after fasting for at least 8 hours.   BUN 12 8 - 23 mg/dL   Creatinine, Ser 8.65 0.61 - 1.24 mg/dL   Calcium  9.1 8.9 - 10.3 mg/dL   Total Protein 6.9 6.5 - 8.1 g/dL   Albumin  3.9 3.5 - 5.0 g/dL   AST 16 15 - 41 U/L   ALT 16 0 - 44 U/L   Alkaline Phosphatase 68 38 - 126 U/L   Total Bilirubin 0.7 0.0 - 1.2 mg/dL   GFR, Estimated >78 >46 mL/min    Comment: (NOTE) Calculated using the CKD-EPI Creatinine Equation (2021)    Anion gap 9 5 - 15    Comment: Performed at [facility], [address], [place], [place] [address]  Type and screen [facility]     Status: None   Collection Time: 05/29/24  9:52 AM  Result Value Ref Range   ABO/RH(D) O POS    Antibody Screen NEG    Sample Expiration      06/01/2024,2359 Performed at [facility], [address] [place], [place] [address]   Surgical pcr screen     Status: None   Collection Time: 05/29/24  5:37 PM   Specimen: Nasal Mucosa; Nasal Swab  Result Value Ref Range   MRSA, PCR NEGATIVE NEGATIVE   Staphylococcus aureus NEGATIVE NEGATIVE    Comment: (NOTE) The Xpert SA Assay (FDA approved for NASAL specimens  in patients 43 years of age and older), is one component of a comprehensive surveillance program. It is not intended to  diagnose infection nor to guide or monitor treatment. Performed at [facility], [address] [place], [place] [address]   Magnesium      Status: None   Collection Time: 05/30/24  5:41 AM  Result Value Ref Range   Magnesium  1.9 1.7 - 2.4 mg/dL    Comment: Performed at [facility], [address], [place], [place] [address]  Basic metabolic panel     Status: Abnormal   Collection Time: 05/30/24  5:41 AM  Result Value Ref Range   Sodium 130 (L) 135 - 145 mmol/L   Potassium 3.9 3.5 - 5.1 mmol/L   Chloride 97 (L) 98 - 111 mmol/L   CO2 26 22 - 32 mmol/L   Glucose, Bld 118 (H) 70 - 99 mg/dL    Comment: Glucose reference range applies only to samples taken after fasting for at least 8 hours.   BUN 13 8 - 23 mg/dL   Creatinine, Ser 0.98 0.61 - 1.24 mg/dL   Calcium  8.9 8.9 - 10.3 mg/dL   GFR, Estimated >11 >91 mL/min    Comment: (NOTE) Calculated using the CKD-EPI Creatinine Equation (2021)    Anion gap 7 5 - 15    Comment: Performed at [facility], [address] [place], [place] [address]  CBC     Status: Abnormal   Collection Time: 05/30/24  5:41 AM  Result Value Ref Range   WBC 5.3 4.0 - 10.5 K/uL   RBC 4.05 (L) 4.22 - 5.81 MIL/uL   Hemoglobin 12.4 (L) 13.0 - 17.0 g/dL   HCT 56.2 (L) 13.0 - 86.5 %   MCV 85.4 80.0 - 100.0 fL   MCH 30.6 26.0 - 34.0 pg   MCHC 35.8 30.0 - 36.0 g/dL   RDW 78.4 69.6 - 29.5 %   Platelets 93 (L) 150 - 400 K/uL    Comment: Immature Platelet Fraction may be clinically indicated, consider ordering this additional test [id number] REPEATED TO VERIFY    nRBC 0.0 0.0 - 0.2 %    Comment: Performed at [facility], [address], [place], [place] [address]    CT Hip Left Wo Contrast Result Date: 05/29/2024 CLINICAL DATA:  Fall with hip pain. EXAM: CT OF THE LEFT HIP WITHOUT CONTRAST TECHNIQUE: Multidetector  CT imaging of the left hip was performed according to the standard protocol. Multiplanar CT image reconstructions were also generated. RADIATION DOSE REDUCTION: This exam was performed according to the departmental dose-optimization program which includes automated exposure control, adjustment of the mA and/or kV according to patient size and/or use of iterative reconstruction technique. COMPARISON:  X-rays from earlier same day FINDINGS: Bones/Joint/Cartilage Left SI joint and left sacrum unremarkable. No left superior or inferior pubic ramus fracture. Acute comminuted intertrochanteric left femoral neck fracture evident with minimal medial impaction. Ligaments Suboptimally assessed by CT. Muscles and Tendons Minimal edema hemorrhage is seen in the region of the left femoral neck fracture. No discrete hematoma. Soft tissues Mild subcutaneous edema over the left hip. IMPRESSION: Acute comminuted intertrochanteric left femoral neck fracture with minimal medial impaction. Electronically Signed   By: Donnal Fusi M.D.   On: 05/29/2024 12:49   DG Chest Portable 1 View Result Date: 05/29/2024 CLINICAL DATA:  fall, hip pain EXAM: PORTABLE CHEST - 1 VIEW COMPARISON:  January 05, 2019 FINDINGS: Sternotomy wires. Aortic valve replacement. The right costophrenic sulcus is excluded from the field of view. Coarsening of the pulmonary interstitium without overt pulmonary edema. No  focal airspace consolidation, pleural effusion, or pneumothorax. No cardiomegaly. Tortuous aorta with aortic atherosclerosis. No acute fracture or destructive lesions. Multilevel thoracic osteophytosis. Radiopaque foreign body overlying the left chest, likely a lighter. IMPRESSION: The right costophrenic sulcus is excluded from the field of view. Otherwise, no acute cardiopulmonary abnormality. Electronically Signed   By: Rance Burrows M.D.   On: 05/29/2024 11:14   DG Hip Unilat With Pelvis 2-3 Views Left Result Date: 05/29/2024 CLINICAL DATA:   fall, hip pain EXAM: DG HIP (WITH OR WITHOUT PELVIS) 2-3V LEFT COMPARISON:  March 19, 2016 FINDINGS: Diffuse osteopenia. No acute fracture or dislocation. There is no evidence of arthropathy or other focal bone abnormality. Multilevel degenerative disc disease of the spine. Soft tissues are unremarkable. Diffuse peripheral vascular atherosclerosis. IMPRESSION: No acute, displaced fracture or dislocation was visualized. However, underlying osteopenia decreases the sensitivity for nondisplaced fracture using plain radiography. If the patient is unable to bear weight, or there is high clinical suspicion, a follow-up CT should be considered for further evaluation. Electronically Signed   By: Rance Burrows M.D.   On: 05/29/2024 11:11    Review of Systems  Constitutional: Negative.   HENT: Negative.    Respiratory: Negative.    Gastrointestinal: Negative.   Genitourinary: Negative.   Psychiatric/Behavioral: Negative.     Blood pressure (!) 149/66, pulse (!) 55, temperature 97.9 F (36.6 C), temperature source Oral, resp. rate 12, height 5' 10.98" (1.803 m), weight 59 kg, SpO2 97%. Physical Exam Constitutional:      Appearance: He is normal weight.  HENT:     Head: Normocephalic and atraumatic.  Cardiovascular:     Rate and Rhythm: Normal rate.     Pulses: Normal pulses.  Abdominal:     General: Abdomen is flat.     Palpations: Abdomen is soft.  Musculoskeletal:        General: Tenderness present.     Cervical back: Normal range of motion and neck supple.  Skin:    General: Skin is warm and dry.     Capillary Refill: Capillary refill takes less than 2 seconds.  Neurological:     General: No focal deficit present.     Mental Status: He is alert.  Psychiatric:        Mood and Affect: Mood normal.     Assessment/Plan: Patient was noted to have intertrochanteric left hip fracture.  I discussed with the patient the need for surgical intervention, in order to provide get everything back  in ambulation related as understand that this would be of an intramedullary device to the left hip.  He does understand the risk of chafing, including malodor, malunion, neurovascular injury, and persistent pain, and the possible need for additional surgery.  After full understanding of the risks and benefits of surgery, the patient does wish to proceed.  We will go ahead and get surgery arranged at his earliest convenience.  Jamerion Cabello L Tannar Broker 05/30/2024, 9:38 AM

## 2024-05-30 NOTE — Progress Notes (Signed)
 PROGRESS NOTE TREYLAN MCCLINTOCK  ZOX:[id number] DOB: 1946-06-13 DOA: 05/29/2024 PCP: [facility]  Brief Narrative/Hospital Course: 78 yom w/ chronic pain/PTSD, COPD with active tobacco abuse, bioprosthetic AVR, CAD, hypertension, GERD, and BPH who presented to the ED with complaints of hip pain after mechanical fall  In the ED vitals stable afebrile, labs generally stable except for thrombocytopenia 121 X-rays are unremarkable, but CT shows acute comminuted left femoral neck fracture. Orthopedic was consulted and transferred to [facility]:  Subjective: Seen and examined Some left hip pain no other new complaints Overnight afebrile, BP stable. Labs with further drop in platelet count.  Assessment/Plan   Acute left femoral neck fracture secondary to mechanical fall: Ortho on board for operative intervention  continue PT OT pain management DVT prophylaxis per orthopedics EKG reviewed from ED, NSR no acute changes.  At baseline no chest pain shortness of breath with mobility-is independent. Reports he had his recent echocardiogram few months ago and [facility]-I am unable to see the record Patient is going for moderate risk surgery.   Thrombocytopenia: Appears to be chronic.  Monitor  CAD: Stable cont  aspirin  and statin. Patient had a cath in 2019 nonobstructive disease Last echo 2019 with normal EF 60 to 65%.  Normally functioning aortic valve prosthesis. At baseline he is very functional he claims himself -" I feel like 78 year old" he has no shortness of breath chest pain dyspnea with activity-independent and ambulatory at home.   Hypertension Bp stable, on metoprolol  and amlodipine    BPH Continue tamsulosin    GERD Cont PPI   COPD Cont prn nebs   History of anemia Stable cont  iron supplementation   Chronic pain Pain management with IV and p.o. medications   Tobacco abuse  Smokes about 1 pack/day, counseled on cessation  Right lower lobe 12 x 11 mm  scarring on  screening CT scan from 05/07/2024:  Increased/new  from CT on 01/29/23, this isand he has a short-term follow-up with CT recommended in 6 months   Malnutrition severe w/ Body mass index is 18.15 kg/m.: Will benefit with PCP follow-up, weight loss,healthy lifestyle and outpatient sleep eval if not done.  DVT prophylaxis: enoxaparin  (LOVENOX ) injection 40 mg Start: 05/29/24 1800 Code Status:   Code Status: Full Code Family Communication: plan of care discussed with patient at bedside. Patient status is: Remains hospitalized because of severity of illness Level of care: Telemetry Medical   Dispo: The patient is from: snf            Anticipated disposition: TBD Objective: Vitals last 24 hrs: Vitals:   05/30/24 1515 05/30/24 1530 05/30/24 1545 05/30/24 1648  BP: (!) 143/72 (!) 148/79 (!) 142/67 131/71  Pulse: 80 73 73 80  Resp: 17 13 18    Temp:   98.2 F (36.8 C) 98.5 F (36.9 C)  TempSrc:    Oral  SpO2: 95% 96% 94% 97%  Weight:      Height:        Physical Examination: General exam: alert awake, 78 than stated age HEENT:Oral mucosa moist, Ear/Nose WNL grossly Respiratory system: Bilaterally clear BS, no use of accessory muscle Cardiovascular system: S1 & S2 +. Gastrointestinal system: Abdomen soft, NT,ND,BS+ Nervous System: Alert, awake, following commands. Extremities: LE edema neg, warm extremities, left hip tender. Skin:No rashes,warm. WJX:BJYNWG muscle bulk/tone.   Data Reviewed: I have personally reviewed following labs and imaging studies ( see epic result tab) CBC: Recent Labs  Lab 05/29/24 0951 05/30/24 0541  WBC 6.3  5.3  NEUTROABS 5.4  --   HGB 13.1 12.4*  HCT 39.3 34.6*  MCV 89.3 85.4  PLT 121* 93*   CMP: Recent Labs  Lab 05/29/24 0951 05/30/24 0541  NA 135 130*  K 3.6 3.9  CL 101 97*  CO2 25 26  GLUCOSE 102* 118*  BUN 12 13  CREATININE 1.13 1.18  CALCIUM  9.1 8.9  MG  --  1.9   GFR: Estimated Creatinine Clearance: 45.1 mL/min  (by C-G formula based on SCr of 1.18 mg/dL). Recent Labs  Lab 05/29/24 0951  AST 16  ALT 16  ALKPHOS 68  BILITOT 0.7  PROT 6.9  ALBUMIN  3.9   No results for input(s): "LIPASE", "AMYLASE" in the last 168 hours. No results for input(s): "AMMONIA" in the last 168 hours. Coagulation Profile:  Recent Labs  Lab 05/29/24 0951  INR 1.1   Unresulted Labs (From admission, onward)     Start     Ordered   06/05/24 0500  Creatinine, serum  (enoxaparin  (LOVENOX )    CrCl >/= 30 ml/min)  Weekly,   R     Comments: while on enoxaparin  therapy    05/29/24 1450           Antimicrobials/Microbiology: Anti-infectives (From admission, onward)    None      No results found for: "SDES", "SPECREQUEST", "CULT", "REPTSTATUS"  Procedures: Procedure(s) (LRB): FIXATION, FRACTURE, INTERTROCHANTERIC, WITH INTRAMEDULLARY ROD (Left) Medications reviewed:  Scheduled Meds:  amLODipine   5 mg Oral Daily   aspirin  EC  81 mg Oral BID   atorvastatin   20 mg Oral q1800   enoxaparin  (LOVENOX ) injection  40 mg Subcutaneous Q24H   famotidine   20 mg Oral Daily   ferrous sulfate  325 mg Oral Q lunch   metoprolol  tartrate  25 mg Oral BID   Continuous Infusions:  Lesa Rape, MD [facility] 05/30/2024, 4:52 PM

## 2024-05-31 ENCOUNTER — Encounter (HOSPITAL_COMMUNITY): Payer: Self-pay | Admitting: Orthopedic Surgery

## 2024-05-31 LAB — BASIC METABOLIC PANEL WITH GFR
Anion gap: 11 (ref 5–15)
BUN: 21 mg/dL (ref 8–23)
CO2: 26 mmol/L (ref 22–32)
Calcium: 8.6 mg/dL — ABNORMAL LOW (ref 8.9–10.3)
Chloride: 93 mmol/L — ABNORMAL LOW (ref 98–111)
Creatinine, Ser: 1.48 mg/dL — ABNORMAL HIGH (ref 0.61–1.24)
GFR, Estimated: 49 mL/min — ABNORMAL LOW (ref >60)
Glucose, Bld: 107 mg/dL — ABNORMAL HIGH (ref 70–99)
Potassium: 4 mmol/L (ref 3.5–5.1)
Sodium: 130 mmol/L — ABNORMAL LOW (ref 135–145)

## 2024-05-31 LAB — CBC
HCT: 30.3 % — ABNORMAL LOW (ref 39.0–52.0)
Hemoglobin: 10.5 g/dL — ABNORMAL LOW (ref 13.0–17.0)
MCH: 30.2 pg (ref 26.0–34.0)
MCHC: 34.7 g/dL (ref 30.0–36.0)
MCV: 87.1 fL (ref 80.0–100.0)
Platelets: 99 10*3/uL — ABNORMAL LOW (ref 150–400)
RBC: 3.48 MIL/uL — ABNORMAL LOW (ref 4.22–5.81)
RDW: 13.2 % (ref 11.5–15.5)
WBC: 6.8 10*3/uL (ref 4.0–10.5)
nRBC: 0 % (ref 0.0–0.2)

## 2024-05-31 MED ORDER — ASPIRIN 81 MG PO TBEC: 81.0000 mg | DELAYED_RELEASE_TABLET | Freq: Two times a day (BID) | ORAL | 12 refills | Status: AC

## 2024-05-31 MED ORDER — HYDROCODONE-ACETAMINOPHEN 5-325 MG PO TABS: 1.0000 | ORAL_TABLET | ORAL | 0 refills | Status: AC | PRN

## 2024-05-31 MED ORDER — SODIUM CHLORIDE 0.9 % IV SOLN: INTRAVENOUS | Status: AC

## 2024-05-31 MED ORDER — METHOCARBAMOL 500 MG PO TABS: 500.0000 mg | ORAL_TABLET | Freq: Four times a day (QID) | ORAL | 0 refills | Status: AC | PRN

## 2024-05-31 NOTE — Plan of Care (Signed)

## 2024-05-31 NOTE — Evaluation (Signed)
 Physical Therapy Evaluation Patient Details Name: Jonathan Holland MRN: [id number] DOB: [birth year] Today's Date: 05/31/2024  History of Present Illness  Pt is a 78 y.o. male presenting to [facility] ED on 05/29/2024 with left hip pain after mechanical fall. Imaging reveals left femoral neck fx. Pt is s/p intermedullary fixation of intertrochanteric L hip fx on 05/30/24. PMH is significant for thrombocytopenia, CAD, HTN, GERD, COPD, and chronic pain.    Clinical Impression  Prior to admission, pt was mobilizing using a SPC and was independent for all ADLs. Pt presents to evaluation with decreased strength, decreased mobility, decreased activity tolerance, impaired balance, and pain, all limiting pt's ability to mobilize near baseline level of function. Pt was able to ambulate room level distances w/ AD and minimal physical assistance given. Mobility progressions limited secondary to pain; RN aware. Pt would benefit from further gait and stair training. PT will continue to treat patient while he is admitted. Recommending HHPT upon discharge to address remaining mobility deficits and optimize return to prior level of function.   Discussed possibility of follow-up therapies upon discharge and pt is confident in his ability to self rehab, declining post-discharge therapy at this time.      If plan is discharge home, recommend the following: A little help with walking and/or transfers;A little help with bathing/dressing/bathroom;Assistance with cooking/housework;Assist for transportation;Help with stairs or ramp for entrance   Can travel by private vehicle        Equipment Recommendations Rolling walker (2 wheels)  Recommendations for Other Services       Functional Status Assessment Patient has had a recent decline in their functional status and demonstrates the ability to make significant improvements in function in a reasonable and predictable amount of time.     Precautions / Restrictions  Precautions Precautions: Fall Recall of Precautions/Restrictions: Intact Restrictions Weight Bearing Restrictions Per Provider Order: Yes LLE Weight Bearing Per Provider Order: Weight bearing as tolerated      Mobility  Bed Mobility Overal bed mobility: Needs Assistance Bed Mobility: Supine to Sit     Supine to sit: Supervision, HOB elevated     General bed mobility comments: Pt flexed bilateral hips and knees, and kept LEs elevated while using bilateral UE to move legs off of bed.    Transfers Overall transfer level: Needs assistance Equipment used: Rolling walker (2 wheels) Transfers: Sit to/from Stand Sit to Stand: Min assist           General transfer comment: Pt performed STS from EOB w/ RW and min A for obtaining full upright position. Pt scooted EOB w/ supervision, and pushed up from surface with bilateral UE, demonstrating decreased anterior trunk lean, requiring physical assistance. Increased time and effort to complete.    Ambulation/Gait Ambulation/Gait assistance: Contact guard assist Gait Distance (Feet): 35 Feet Assistive device: Rolling walker (2 wheels) Gait Pattern/deviations: Step-to pattern, Decreased step length - left, Decreased stance time - left, Decreased stride length, Decreased weight shift to left, Antalgic, Trunk flexed Gait velocity: reduced Gait velocity interpretation: <1.31 ft/sec, indicative of household ambulator Pre-gait activities: Pt marched in place at [employer], demonstrating decreased weight acceptance on LLE, and decreased L hip flexion. Pt relies heavily on external device for maintaing upright. General Gait Details: Pt ambulates with decreased step length, decreased cadence, decreased stance time on LLE and decreased L hip flexion during swing phase of gait. Increased time to complete. VC given for sequencing.  Stairs  Wheelchair Mobility     Tilt Bed    Modified Rankin (Stroke Patients Only)       Balance  Overall balance assessment: Needs assistance Sitting-balance support: No upper extremity supported, Feet supported Sitting balance-Leahy Scale: Good     Standing balance support: Bilateral upper extremity supported, During functional activity, Reliant on assistive device for balance Standing balance-Leahy Scale: Fair Standing balance comment: Pt is able to stand at bedside commode with CGA and no upper extremity support.                             Pertinent Vitals/Pain Pain Assessment Pain Assessment: 0-10 Pain Score: 8  Pain Location: L hip Pain Descriptors / Indicators: Constant, Discomfort, Grimacing, Pressure, Spasm, Tightness Pain Intervention(s): Limited activity within patient's tolerance, Monitored during session, Patient requesting pain meds-RN notified    Home Living Family/patient expects to be discharged to:: Private residence Living Arrangements: Alone Available Help at Discharge: Family;Neighbor;Available PRN/intermittently (daughter is coming to stay following discharge; has neighbors who can help if needed) Type of Home: House Home Access: Stairs to enter Entrance Stairs-Rails: None Entrance Stairs-Number of Steps: 1   Home Layout: One level Home Equipment: Cane - single point      Prior Function Prior Level of Function : Independent/Modified Independent             Mobility Comments: Pt is mod I with all mobility utilizing a SPC for ambulation. ADLs Comments: Pt reports independence with all ADLs     Extremity/Trunk Assessment   Upper Extremity Assessment Upper Extremity Assessment: Overall WFL for tasks assessed    Lower Extremity Assessment Lower Extremity Assessment: Generalized weakness;LLE deficits/detail LLE: Unable to fully assess due to pain LLE Sensation: WNL (as per pt report)    Cervical / Trunk Assessment Cervical / Trunk Assessment: Kyphotic  Communication   Communication Communication: Impaired Factors Affecting  Communication: Hearing impaired    Cognition Arousal: Alert Behavior During Therapy: WFL for tasks assessed/performed   PT - Cognitive impairments: No apparent impairments                         Following commands: Intact       Cueing Cueing Techniques: Verbal cues, Tactile cues, Visual cues     General Comments General comments (skin integrity, edema, etc.): pt reports mild dizziness following bout of ambulation, but reports symptoms subside with increased time and rest.    Exercises     Assessment/Plan    PT Assessment Patient needs continued PT services  PT Problem List Decreased strength;Decreased range of motion;Decreased activity tolerance;Decreased balance;Decreased mobility;Pain       PT Treatment Interventions DME instruction;Stair training;Gait training;Functional mobility training;Therapeutic exercise;Therapeutic activities;Balance training;Neuromuscular re-education;Patient/family education    PT Goals (Current goals can be found in the Care Plan section)  Acute Rehab PT Goals Patient Stated Goal: to go home and be with his family PT Goal Formulation: With patient Time For Goal Achievement: 06/14/24 Potential to Achieve Goals: Good    Frequency Min 3X/week     Co-evaluation               AM-PAC PT "6 Clicks" Mobility  Outcome Measure Help needed turning from your back to your side while in a flat bed without using bedrails?: None Help needed moving from lying on your back to sitting on the side of a flat bed without using bedrails?: A Little Help  needed moving to and from a bed to a chair (including a wheelchair)?: A Little Help needed standing up from a chair using your arms (e.g., wheelchair or bedside chair)?: A Little Help needed to walk in hospital room?: A Little Help needed climbing 3-5 steps with a railing? : A Lot 6 Click Score: 18    End of Session Equipment Utilized During Treatment: Gait belt Activity Tolerance: Patient  tolerated treatment well Patient left: in chair;with call bell/phone within reach;with chair alarm set;with nursing/sitter in room Nurse Communication: Mobility status;Patient requests pain meds PT Visit Diagnosis: Unsteadiness on feet (R26.81);History of falling (Z91.81);Muscle weakness (generalized) (M62.81);Pain Pain - Right/Left: Left Pain - part of body: Hip    Time: 0815-0850 PT Time Calculation (min) (ACUTE ONLY): 35 min   Charges:   PT Evaluation $PT Eval Low Complexity: 1 Low   PT General Charges $$ ACUTE PT VISIT: 1 Visit         Lonell Rives, SPT Acute Rehab [phone]   Lonell Rives 05/31/2024, 9:37 AM

## 2024-05-31 NOTE — Progress Notes (Addendum)
 Transition of Care [facility]) - Inpatient Brief Assessment   Patient Details  Name: Jonathan Holland MRN: [id number] Date of Birth: 1946-01-20  Transition of Care [facility]) CM/SW Contact:    Jannine Meo, RN Phone Number: 05/31/2024, 10:47 AM   Clinical Narrative:  Patient from home with c/o fall causing left hip fracture. Patient has surgery on 6/1. PT recommendations for [facility] PT noted. Spoke with Pt at bedside, reports no preference for [facility] agency and that his neighbor is giving him a RW when he gets home. HH PT referral placed with Amy Lennart Quitter). Patient confirmed that he has a ride home when discharged.  Skilled Services request form emailed to [place] [facility] [email or web address].    Transition of Care Asessment: Insurance and Status: (P) Insurance coverage has been reviewed Patient has primary care physician: (P) Yes Home environment has been reviewed: (P) home Prior level of function:: (P) independent Prior/Current Home Services: (P) No current home services Social Drivers of Health Review: (P) SDOH reviewed no interventions necessary Readmission risk has been reviewed: (P) Yes Transition of care needs: (P) transition of care needs identified, TOC will continue to follow

## 2024-05-31 NOTE — Progress Notes (Signed)
 PROGRESS NOTE Jonathan Holland  ZOX:[id number] DOB: April 15, 1946 DOA: 05/29/2024 PCP: [facility]  Brief Narrative/Hospital Course: 59 yom w/ chronic pain/PTSD, COPD with active tobacco abuse, bioprosthetic AVR, CAD, hypertension, GERD, and BPH who presented to the ED with complaints of hip pain after mechanical fall  In the ED vitals stable afebrile, labs generally stable except for thrombocytopenia 121 X-rays are unremarkable, but CT shows acute comminuted left femoral neck fracture. Orthopedic was consulted and transferred to [facility]:  Subjective: Patient seen and examined Overnight he remains afebrile BP 97-1 08, on room air Labs with hyponatremia 130 slight bump in creatinine 1.4 hemoglobin down to 10.5 platelet 99 He is resting comfortably on the bedside chair  Assessment/Plan   Acute left femoral neck fracture secondary to mechanical fall S/p ORIF IM Rod 6/1: Postop continue plan as per orthopedics with PT OT, pain control, Lovenox  for DVT prophylaxis  AKI Hypovolemic hyponatremia: Noted bump in creatinine 1.4, add gentle IV fluids, hold amlodipine  avoid hypotension-metoprolol  was also held  History of anemia ABLA in the setting of hip fracture and surgery: Some drop in hemoglobin continue home iron supplementation, trend Hb   Thrombocytopenia: Appears to be chronic.  Monitor  CAD: Stable cont  aspirin  and statin. Patient had a cath in 2019 nonobstructive disease.Last echo 2019 with normal EF 60 to 65%.normally functioning aortic valve prosthesis.   Hypertension Bp soft cont metoprolol . Hold amlodipine    BPH Continue tamsulosin    GERD Cont PPI   COPD Cont prn nebs   Chronic pain Pain management with IV and p.o. medications   Tobacco abuse  Smokes about 1 pack/day, counseled on cessation  Right lower lobe 12 x 11 mm scarring on  screening CT scan from 05/07/2024:  Increased/new  from CT on 01/29/23, this isand he has a short-term follow-up with CT  recommended in 6 months   Malnutrition severe w/ Body mass index is 18.15 kg/m.: Will benefit with PCP follow-up, weight loss,healthy lifestyle and outpatient sleep eval if not done.  DVT prophylaxis: enoxaparin  (LOVENOX ) injection 40 mg Start: 05/29/24 1800 Code Status:   Code Status: Full Code Family Communication: plan of care discussed with patient at bedside. Patient status is: Remains hospitalized because of severity of illness Level of care: Telemetry Medical   Dispo: The patient is from:Home            Anticipated disposition: SNF  Objective: Vitals last 24 hrs: Vitals:   05/30/24 1916 05/30/24 2000 05/31/24 0326 05/31/24 0732  BP: 136/76  (!) 97/57 (!) 108/58  Pulse: 74  (!) 58 61  Resp: 18  18 16   Temp: (!) 97.4 F (36.3 C) 97.8 F (36.6 C) 97.7 F (36.5 C) (!) 97.5 F (36.4 C)  TempSrc: Oral Oral  Oral  SpO2: 98%  99% 100%  Weight:      Height:        Physical Examination: General exam: alert awake, oriented  HEENT:Oral mucosa moist, Ear/Nose WNL grossly Respiratory system: Bilaterally clear BS,no use of accessory muscle Cardiovascular system: S1 & S2 +, No JVD. Gastrointestinal system: Abdomen soft,NT,ND, BS+ Nervous System: Alert, awake, moving all extremities,and following commands. Extremities: LE edema neg,distal peripheral pulses palpable and warm.  Left hip surgical site with dressing in place Skin: No rashes,no icterus. MSK: Normal muscle bulk,tone, power    Data Reviewed: I have personally reviewed following labs and imaging studies ( see epic result tab) CBC: Recent Labs  Lab 05/29/24 0951 05/30/24 0541 05/31/24 0633  WBC 6.3  5.3 6.8  NEUTROABS 5.4  --   --   HGB 13.1 12.4* 10.5*  HCT 39.3 34.6* 30.3*  MCV 89.3 85.4 87.1  PLT 121* 93* 99*   CMP: Recent Labs  Lab 05/29/24 0951 05/30/24 0541 05/31/24 0633  NA 135 130* 130*  K 3.6 3.9 4.0  CL 101 97* 93*  CO2 25 26 26   GLUCOSE 102* 118* 107*  BUN 12 13 21   CREATININE 1.13 1.18  1.48*  CALCIUM  9.1 8.9 8.6*  MG  --  1.9  --    GFR: Estimated Creatinine Clearance: 36 mL/min (A) (by C-G formula based on SCr of 1.48 mg/dL (H)). Recent Labs  Lab 05/29/24 0951  AST 16  ALT 16  ALKPHOS 68  BILITOT 0.7  PROT 6.9  ALBUMIN  3.9   No results for input(s): "LIPASE", "AMYLASE" in the last 168 hours. No results for input(s): "AMMONIA" in the last 168 hours. Coagulation Profile:  Recent Labs  Lab 05/29/24 0951  INR 1.1   Unresulted Labs (From admission, onward)     Start     Ordered   06/05/24 0500  Creatinine, serum  (enoxaparin  (LOVENOX )    CrCl >/= 30 ml/min)  Weekly,   R     Comments: while on enoxaparin  therapy    05/29/24 1450   06/01/24 0500  Basic metabolic panel with GFR  Daily,   R      05/31/24 0614   06/01/24 0500  CBC  Daily,   R      05/31/24 1610           Antimicrobials/Microbiology: Anti-infectives (From admission, onward)    None      No results found for: "SDES", "SPECREQUEST", "CULT", "REPTSTATUS"  Procedures: Procedure(s) (LRB): FIXATION, FRACTURE, INTERTROCHANTERIC, WITH INTRAMEDULLARY ROD (Left) Medications reviewed:  Scheduled Meds:  aspirin  EC  81 mg Oral BID   atorvastatin   20 mg Oral q1800   enoxaparin  (LOVENOX ) injection  40 mg Subcutaneous Q24H   famotidine   20 mg Oral Daily   ferrous sulfate  325 mg Oral Q lunch   metoprolol  tartrate  25 mg Oral BID   Continuous Infusions:  sodium chloride       Lesa Rape, MD [facility] 05/31/2024, 10:59 AM

## 2024-05-31 NOTE — Progress Notes (Signed)
    Patient doing well PO Day 1 S/P intramedullary fixation of intertrochanteric L hip fx uneventfully. Px well controlled and pt has been up with PT/OT. Plan is to D/C home with [facility] PT.   Physical Exam: Vitals:   05/31/24 0326 05/31/24 0732  BP: (!) 97/57 (!) 108/58  Pulse: (!) 58 61  Resp: 18 16  Temp: 97.7 F (36.5 C) (!) 97.5 F (36.4 C)  SpO2: 99% 100%    Dressings in place, CDI, pt resting comfortably. Distal compartments soft, 2+ DPP = BIL, NVI  POD #1 s/p intramedullary fixation of intertrochanteric L hip fx  - up with PT/OT, encourage ambulation, WBAT - Norco for pain, Robaxin for muscle spasms - ASA 81mg  BID x 30days for DVT prophylaxis - likely d/c home with [facility]  - F/u in 2 weeks  [employer] [phone] [address] [place], [place] [address]

## 2024-05-31 NOTE — TOC CAGE-AID Note (Signed)
 Transition of Care [facility]) - CAGE-AID Screening   Patient Details  Name: Jonathan Holland MRN: [id number] Date of Birth: [birth year]  Transition [facility]) CM/SW Contact:    Lissette Schenk E Karesa Maultsby, LCSW Phone Number: 05/31/2024, 8:35 AM   Clinical Narrative:    CAGE-AID Screening:    Have You Ever Felt You Ought to Cut Down on Your Drinking or Drug Use?: No Have People Annoyed You By [employer] Your Drinking Or Drug Use?: No Have You Felt Bad Or Guilty About Your Drinking Or Drug Use?: No Have You Ever Had a Drink or Used Drugs First Thing In The Morning to Steady Your Nerves or to Get Rid of a Hangover?: No CAGE-AID Score: 0  Substance Abuse Education Offered: No

## 2024-06-01 LAB — BASIC METABOLIC PANEL WITH GFR
Anion gap: 10 (ref 5–15)
BUN: 22 mg/dL (ref 8–23)
CO2: 25 mmol/L (ref 22–32)
Calcium: 8.8 mg/dL — ABNORMAL LOW (ref 8.9–10.3)
Chloride: 99 mmol/L (ref 98–111)
Creatinine, Ser: 1.4 mg/dL — ABNORMAL HIGH (ref 0.61–1.24)
GFR, Estimated: 52 mL/min — ABNORMAL LOW (ref >60)
Glucose, Bld: 88 mg/dL (ref 70–99)
Potassium: 3.7 mmol/L (ref 3.5–5.1)
Sodium: 134 mmol/L — ABNORMAL LOW (ref 135–145)

## 2024-06-01 LAB — CBC
HCT: 29 % — ABNORMAL LOW (ref 39.0–52.0)
Hemoglobin: 9.9 g/dL — ABNORMAL LOW (ref 13.0–17.0)
MCH: 30.3 pg (ref 26.0–34.0)
MCHC: 34.1 g/dL (ref 30.0–36.0)
MCV: 88.7 fL (ref 80.0–100.0)
Platelets: 99 10*3/uL — ABNORMAL LOW (ref 150–400)
RBC: 3.27 MIL/uL — ABNORMAL LOW (ref 4.22–5.81)
RDW: 13.2 % (ref 11.5–15.5)
WBC: 5.3 10*3/uL (ref 4.0–10.5)
nRBC: 0 % (ref 0.0–0.2)

## 2024-06-01 NOTE — Progress Notes (Signed)
 Physical Therapy Treatment Patient Details Name: Jonathan Holland MRN: [id number] DOB: 09-Aug-1946 Today's Date: 06/01/2024   History of Present Illness Pt is a 78 y.o. male presenting to [facility] ED on 05/29/2024 with left hip pain after mechanical fall. Imaging reveals left femoral neck fx. Pt is s/p intermedullary fixation of intertrochanteric L hip fx on 05/30/24. PMH is significant for thrombocytopenia, CAD, HTN, GERD, COPD, and chronic pain.    PT Comments  Pt tolerated mobility progressions well, navigating single step w/ AD and no physical assistance. Pt would benefit from further gait and stair training. PT will continue treat patient while he is admitted. Recommending HHPT at discharge to optimize return to PLOF.   Pt continues to decline follow-up therapies upon discharge.     If plan is discharge home, recommend the following: A little help with walking and/or transfers;A little help with bathing/dressing/bathroom;Assistance with cooking/housework;Assist for transportation;Help with stairs or ramp for entrance   Can travel by private vehicle        Equipment Recommendations  None recommended by PT (pt reports neighbor is letting him borrow his RW)    Recommendations for Other Services       Precautions / Restrictions Precautions Precautions: Fall Recall of Precautions/Restrictions: Intact Restrictions Weight Bearing Restrictions Per Provider Order: Yes LLE Weight Bearing Per Provider Order: Weight bearing as tolerated     Mobility  Bed Mobility               General bed mobility comments: Pt was received sitting EOB    Transfers Overall transfer level: Needs assistance Equipment used: Rolling walker (2 wheels) Transfers: Sit to/from Stand Sit to Stand: Supervision           General transfer comment: Pt performed multiple STS from EOB w/ RW and supervision; increased time to complete.    Ambulation/Gait Ambulation/Gait assistance: Supervision Gait Distance  (Feet): 4 Feet Assistive device: Rolling walker (2 wheels) Gait Pattern/deviations: Step-to pattern, Trunk flexed, Antalgic, Decreased step length - left, Decreased stance time - left Gait velocity: reduced Gait velocity interpretation: <1.8 ft/sec, indicate of risk for recurrent falls   General Gait Details: Pt ambulated several steps to single step w/ RW and supervision. Increased time to complete.   Stairs Stairs: Yes Stairs assistance: Contact guard assist Stair Management: No rails, Backwards, With walker Number of Stairs: 1 General stair comments: Pt negotiated stairs as per home setup w/ RW and CGA. VC given for sequencing; increased time to complete.   Wheelchair Mobility     Tilt Bed    Modified Rankin (Stroke Patients Only)       Balance Overall balance assessment: Needs assistance Sitting-balance support: No upper extremity supported, Feet supported Sitting balance-Leahy Scale: Good Sitting balance - Comments: Pt was able to sit EOB to don clothing w/ supervision for balance, but requires physical assistance for clothing management.   Standing balance support: No upper extremity supported, During functional activity Standing balance-Leahy Scale: Fair Standing balance comment: Pt is able to stand at EOB w/out BUE support for several minutes for clothing management.                            Communication Communication Communication: Impaired Factors Affecting Communication: Hearing impaired  Cognition Arousal: Alert Behavior During Therapy: WFL for tasks assessed/performed   PT - Cognitive impairments: No apparent impairments  Following commands: Intact      Cueing Cueing Techniques: Verbal cues, Tactile cues, Visual cues  Exercises      General Comments General comments (skin integrity, edema, etc.): no signs of acute distress      Pertinent Vitals/Pain Pain Assessment Pain Assessment: Faces Faces  Pain Scale: Hurts little more Pain Location: L hip Pain Descriptors / Indicators: Discomfort, Grimacing Pain Intervention(s): Limited activity within patient's tolerance    Home Living                          Prior Function            PT Goals (current goals can now be found in the care plan section) Acute Rehab PT Goals Patient Stated Goal: to go home and be with his family PT Goal Formulation: With patient Time For Goal Achievement: 06/14/24 Potential to Achieve Goals: Good Progress towards PT goals: Progressing toward goals    Frequency    Min 3X/week      PT Plan      Co-evaluation              AM-PAC PT "6 Clicks" Mobility   Outcome Measure  Help needed turning from your back to your side while in a flat bed without using bedrails?: None Help needed moving from lying on your back to sitting on the side of a flat bed without using bedrails?: None Help needed moving to and from a bed to a chair (including a wheelchair)?: None Help needed standing up from a chair using your arms (e.g., wheelchair or bedside chair)?: None Help needed to walk in hospital room?: A Little Help needed climbing 3-5 steps with a railing? : A Little 6 Click Score: 22    End of Session   Activity Tolerance: Patient tolerated treatment well Patient left: in bed;with call bell/phone within reach;with nursing/sitter in room Nurse Communication: Mobility status PT Visit Diagnosis: Unsteadiness on feet (R26.81);History of falling (Z91.81);Muscle weakness (generalized) (M62.81);Pain Pain - Right/Left: Left Pain - part of body: Hip     Time: 1102-1119 PT Time Calculation (min) (ACUTE ONLY): 17 min  Charges:    $Gait Training: 8-22 mins PT General Charges $$ ACUTE PT VISIT: 1 Visit                     Lonell Rives, SPT Acute Rehab [phone]    Lonell Rives 06/01/2024, 1:03 PM

## 2024-06-01 NOTE — Progress Notes (Signed)
 This Clinical research associate has been informed by NTs that he lost $4K, pt seen in room, dressed up, and getting ready to be discharged to home. Pt has been informed this past Sunday during admission , that facility is NOT responsible for any losses/damages to  any personal belongings, and pt acknowledged without complaints. The only property missing was his cellphone, and returned by [facility] staff on the same day.

## 2024-06-01 NOTE — Plan of Care (Signed)
  Problem: Skin Integrity: Goal: Risk for impaired skin integrity will decrease Outcome: Not Progressing   Problem: Safety: Goal: Ability to remain free from injury will improve Outcome: Not Progressing   Problem: Pain Managment: Goal: General experience of comfort will improve and/or be controlled Outcome: Not Progressing

## 2024-06-01 NOTE — Discharge Summary (Signed)
 Physician Discharge Summary  Jonathan Holland:[id number] DOB: 20-Feb-1946 DOA: 05/29/2024  PCP: [facility]  Admit date: 05/29/2024 Discharge date: 06/01/2024 Recommendations for Outpatient Follow-up:  Follow up with PCP in 1 weeks-call for appointment Please obtain BMP/CBC in one week FOLLOW UP with orthopedics in 2 wk psot op  Discharge Dispo: home w/ [facility] Discharge Condition: Stable Code Status:   Code Status: Full Code Diet recommendation:  Diet Order             Diet regular Room service appropriate? Yes; Fluid consistency: Thin  Diet effective now                    Brief/Interim Summary: 77 yom w/ chronic pain/PTSD, COPD with active tobacco abuse, bioprosthetic AVR, CAD, hypertension, GERD, and BPH who presented to the ED with complaints of hip pain after mechanical fall  In the ED vitals stable afebrile, labs generally stable except for thrombocytopenia 121 X-rays are unremarkable, but CT shows acute comminuted left femoral neck fracture. Orthopedic was consulted and transferred to [facility] Underwent IM rod 6/1 postop doing well> did well with PT OT-and he has been interested in going home Given IV fluid hydration for AKI 6/2 overnight> creat at  1.4. Now threatening to leave if not discharaged Almost signed out AMA last night but agreed to stay overnight yesterday  Subjective: Seen examined Overnight afebrile BP stable resting comfortably " I rather be dead tahtn staying in hospita. Doc you gotta let me go" Wants to sign out AMA if not discahrged  Discharge Daignses:  Acute left femoral neck fracture secondary to mechanical fall S/p ORIF IM Rod 6/1: Postop continue plan as per orthopedics with PT OT, pain control, Lovenox  for DVT prophylaxis Per orthopedics plan is to continue aspirin  81 twice daily x 30 days for DVT prophylaxis, orthopedic follow-up in 2 weeks and continue pain management muscle accident as outpatient HH is set up/  AKI Hypovolemic  hyponatremia: Noted bump in creatinine 1.4 and given ivf  repeat labs w/ creat 1.4- ideally would keep today but he is threatening to leave. Agrees to see pcp In 2 days to get bmp done ad encouraged  fluids hydration > 64 oz  History of anemia ABLA in the setting of hip fracture and surgery: Some drop in hemoglobin as expected due to postop status, follow-up outpatient CBC in 1 week from pcp Recent Labs  Lab 05/29/24 0951 05/30/24 0541 05/31/24 0633 06/01/24 0915  HGB 13.1 12.4* 10.5* 9.9*  HCT 39.3 34.6* 30.3* 29.0*      Thrombocytopenia: Appears to be chronic.  Monitor  CAD: Stable cont  aspirin  and statin.Patient had a cath in 2019 nonobstructive disease.Last echo 2019 with normal EF 60 to 65%.normally functioning aortic valve prosthesis.   Hypertension Bp soft cont metoprolol . Hold amlodipine    BPH Continue tamsulosin    GERD Cont PPI   COPD Cont prn nebs   Chronic pain Cont Pain management   Tobacco abuse  Smokes about 1 pack/day, counseled on cessation  Right lower lobe 12 x 11 mm scarring on  screening CT scan from 05/07/2024:  Increased/new  from CT on 01/29/23, this isand he has a short-term follow-up with CT recommended in 6 months     Discharge Exam: Vitals:   06/01/24 0521 06/01/24 0747  BP: (!) 109/59 138/81  Pulse: (!) 55 66  Resp: 16 18  Temp: 97.7 F (36.5 C) 98.1 F (36.7 C)  SpO2: 100% 99%   General: Pt  is alert, awake, not in acute distress Cardiovascular: RRR, S1/S2 +, no rubs, no gallops Respiratory: CTA bilaterally, no wheezing, no rhonchi Abdominal: Soft, NT, ND, bowel sounds + Extremities: no edema, no cyanosis  Discharge Instructions  Discharge Instructions     Discharge instructions   Complete by: As directed    Follow-up with orthopedics in 2 weeks postop Follow-up with PCP in 1 week  Please call call MD or return to ER for similar or worsening recurring problem that brought you to hospital or if any  fever,nausea/vomiting,abdominal pain, uncontrolled pain, chest pain,  shortness of breath or any other alarming symptoms.  Please follow-up your doctor as instructed in a week time and call the office for appointment.  Please avoid alcohol, smoking, or any other illicit substance and maintain healthy habits including taking your regular medications as prescribed.  You were cared for by a hospitalist during your hospital stay. If you have any questions about your discharge medications or the care you received while you were in the hospital after you are discharged, you can call the unit and ask to speak with the hospitalist on call if the hospitalist that took care of you is not available.  Once you are discharged, your primary care physician will handle any further medical issues. Please note that NO REFILLS for any discharge medications will be authorized once you are discharged, as it is imperative that you return to your primary care physician (or establish a relationship with a primary care physician if you do not have one) for your aftercare needs so that they can reassess your need for medications and monitor your lab values   Increase activity slowly   Complete by: As directed       Allergies as of 06/01/2024       Reactions   Other Anaphylaxis, Other (See Comments)   Medication used prior to colonoscopy- Pt states it feels like "gasoline was poured on him", throat swelling   Penicillins Swelling, Rash, Other (See Comments)   Immediate rash, facial/tongue/throat swelling, SOB or lightheadedness with hypotension Severe rash involving mucus membranes or skin necrosis   Golytely [peg [id number]] Rash        Medication List     STOP taking these medications    BC HEADACHE POWDER PO   oxyCODONE  5 MG immediate release tablet Commonly known as: Oxy IR/ROXICODONE        TAKE these medications    amLODipine  5 MG tablet Commonly known as: NORVASC  Take 1 tablet (5  mg total) by mouth daily.   aspirin  EC 81 MG tablet Take 1 tablet (81 mg total) by mouth 2 (two) times daily. Swallow whole.   atorvastatin  20 MG tablet Commonly known as: LIPITOR  Take 20 mg by mouth daily.   ferrous sulfate 325 (65 FE) MG tablet Take 325 mg by mouth daily.   HYDROcodone -acetaminophen  5-325 MG tablet Commonly known as: NORCO/VICODIN Take 1-2 tablets by mouth every 4 (four) hours as needed for severe pain (pain score 7-10).   methocarbamol 500 MG tablet Commonly known as: ROBAXIN Take 1-2 tablets (500-1,000 mg total) by mouth every 6 (six) hours as needed for muscle spasms.   metoprolol  tartrate 50 MG tablet Commonly known as: LOPRESSOR  Take 25 mg by mouth See admin instructions. Take 1/2 tablet (25mg ) by mouth twice daily, once in the morning and once in the early afternoon.        Follow-up Information     [facility] Follow up.  Why: Phyical therapy. Office will call to arrange follow up after hospital discharge. Contact information: [address] [place] [address] [phone]         [facility] Follow up in 1 week(s).   Specialty: General Practice Contact information: [address] [place] [address] [phone]         Virl Grimes, MD Follow up in 13 day(s).   Specialty: Orthopedic Surgery Contact information: [address] [place] [address] [phone]                Allergies  Allergen Reactions   Other Anaphylaxis and Other (See Comments)    Medication used prior to colonoscopy- Pt states it feels like "gasoline was poured on him", throat swelling   Penicillins Swelling, Rash and Other (See Comments)    Immediate rash, facial/tongue/throat swelling, SOB or lightheadedness with hypotension Severe rash involving mucus membranes or skin necrosis   Golytely [Peg 3350-Kcl-Nabcb-Nacl-Nasulf] Rash    The results of significant diagnostics from this  hospitalization (including imaging, microbiology, ancillary and laboratory) are listed below for reference.    Microbiology: Recent Results (from the past 240 hours)  Surgical pcr screen     Status: None   Collection Time: 05/29/24  5:37 PM   Specimen: Nasal Mucosa; Nasal Swab  Result Value Ref Range Status   MRSA, PCR NEGATIVE NEGATIVE Final   Staphylococcus aureus NEGATIVE NEGATIVE Final    Comment: (NOTE) The Xpert SA Assay (FDA approved for NASAL specimens in patients 49 years of age and older), is one component of a comprehensive surveillance program. It is not intended to diagnose infection nor to guide or monitor treatment. Performed at [facility], [address] [place], [place] [address]     Procedures/Studies: DG HIP UNILAT WITH PELVIS 2-3 VIEWS LEFT Result Date: 05/30/2024 CLINICAL DATA:  Elective surgery. EXAM: DG HIP (WITH OR WITHOUT PELVIS) 2-3V LEFT COMPARISON:  Preoperative imaging FINDINGS: Six fluoroscopic spot views of the left hip submitted from the operating room. Femoral intramedullary nail with trans trochanteric and distal locking screw fixation traverse proximal femur fracture. Fluoroscopy time 1:04 0.9 seconds. Dose 10.8 mGy. IMPRESSION: Intraoperative fluoroscopy during proximal femur fracture fixation. Electronically Signed   By: Chadwick Colonel M.D.   On: 05/30/2024 16:33   DG C-Arm 1-60 Min-No Report Result Date: 05/30/2024 Fluoroscopy was utilized by the requesting physician.  No radiographic interpretation.   DG C-Arm 1-60 Min-No Report Result Date: 05/30/2024 Fluoroscopy was utilized by the requesting physician.  No radiographic interpretation.   CT Hip Left Wo Contrast Result Date: 05/29/2024 CLINICAL DATA:  Fall with hip pain. EXAM: CT OF THE LEFT HIP WITHOUT CONTRAST TECHNIQUE: Multidetector CT imaging of the left hip was performed according to the standard protocol. Multiplanar CT image reconstructions were also generated. RADIATION DOSE  REDUCTION: This exam was performed according to the departmental dose-optimization program which includes automated exposure control, adjustment of the mA and/or kV according to patient size and/or use of iterative reconstruction technique. COMPARISON:  X-rays from earlier same day FINDINGS: Bones/Joint/Cartilage Left SI joint and left sacrum unremarkable. No left superior or inferior pubic ramus fracture. Acute comminuted intertrochanteric left femoral neck fracture evident with minimal medial impaction. Ligaments Suboptimally assessed by CT. Muscles and Tendons Minimal edema hemorrhage is seen in the region of the left femoral neck fracture. No discrete hematoma. Soft tissues Mild subcutaneous edema over the left hip. IMPRESSION: Acute comminuted intertrochanteric left femoral neck fracture with  minimal medial impaction. Electronically Signed   By: Donnal Fusi M.D.   On: 05/29/2024 12:49   DG Chest Portable 1 View Result Date: 05/29/2024 CLINICAL DATA:  fall, hip pain EXAM: PORTABLE CHEST - 1 VIEW COMPARISON:  January 05, 2019 FINDINGS: Sternotomy wires. Aortic valve replacement. The right costophrenic sulcus is excluded from the field of view. Coarsening of the pulmonary interstitium without overt pulmonary edema. No focal airspace consolidation, pleural effusion, or pneumothorax. No cardiomegaly. Tortuous aorta with aortic atherosclerosis. No acute fracture or destructive lesions. Multilevel thoracic osteophytosis. Radiopaque foreign body overlying the left chest, likely a lighter. IMPRESSION: The right costophrenic sulcus is excluded from the field of view. Otherwise, no acute cardiopulmonary abnormality. Electronically Signed   By: Rance Burrows M.D.   On: 05/29/2024 11:14   DG Hip Unilat With Pelvis 2-3 Views Left Result Date: 05/29/2024 CLINICAL DATA:  fall, hip pain EXAM: DG HIP (WITH OR WITHOUT PELVIS) 2-3V LEFT COMPARISON:  March 19, 2016 FINDINGS: Diffuse osteopenia. No acute fracture or  dislocation. There is no evidence of arthropathy or other focal bone abnormality. Multilevel degenerative disc disease of the spine. Soft tissues are unremarkable. Diffuse peripheral vascular atherosclerosis. IMPRESSION: No acute, displaced fracture or dislocation was visualized. However, underlying osteopenia decreases the sensitivity for nondisplaced fracture using plain radiography. If the patient is unable to bear weight, or there is high clinical suspicion, a follow-up CT should be considered for further evaluation. Electronically Signed   By: Rance Burrows M.D.   On: 05/29/2024 11:11    Labs: BNP (last 3 results) No results for input(s): "BNP" in the last 8760 hours. Basic Metabolic Panel: Recent Labs  Lab 05/29/24 0951 05/30/24 0541 05/31/24 0633 06/01/24 0915  NA 135 130* 130* 134*  K 3.6 3.9 4.0 3.7  CL 101 97* 93* 99  CO2 25 26 26 25   GLUCOSE 102* 118* 107* 88  BUN 12 13 21 22   CREATININE 1.13 1.18 1.48* 1.40*  CALCIUM  9.1 8.9 8.6* 8.8*  MG  --  1.9  --   --    Liver Function Tests: Recent Labs  Lab 05/29/24 0951  AST 16  ALT 16  ALKPHOS 68  BILITOT 0.7  PROT 6.9  ALBUMIN  3.9   No results for input(s): "LIPASE", "AMYLASE" in the last 168 hours. No results for input(s): "AMMONIA" in the last 168 hours. CBC: Recent Labs  Lab 05/29/24 0951 05/30/24 0541 05/31/24 0633 06/01/24 0915  WBC 6.3 5.3 6.8 5.3  NEUTROABS 5.4  --   --   --   HGB 13.1 12.4* 10.5* 9.9*  HCT 39.3 34.6* 30.3* 29.0*  MCV 89.3 85.4 87.1 88.7  PLT 121* 93* 99* 99*      Component Value Date/Time   COLORURINE YELLOW 11/16/2018 0840   APPEARANCEUR CLEAR 11/16/2018 0840   LABSPEC 1.010 11/16/2018 0840   PHURINE 5.0 11/16/2018 0840   GLUCOSEU NEGATIVE 11/16/2018 0840   HGBUR NEGATIVE 11/16/2018 0840   BILIRUBINUR NEGATIVE 11/16/2018 0840   KETONESUR NEGATIVE 11/16/2018 0840   PROTEINUR NEGATIVE 11/16/2018 0840   NITRITE NEGATIVE 11/16/2018 0840   LEUKOCYTESUR NEGATIVE 11/16/2018 0840   Sepsis Labs Recent Labs  Lab 05/29/24 0951 05/30/24 0541 05/31/24 0633 06/01/24 0915  WBC 6.3 5.3 6.8 5.3  Microbiology Recent Results (from the past 240 hours)  Surgical pcr screen     Status: None   Collection Time: 05/29/24  5:37 PM   Specimen: Nasal Mucosa; Nasal Swab  Result Value Ref Range Status   MRSA,  PCR NEGATIVE NEGATIVE Final   Staphylococcus aureus NEGATIVE NEGATIVE Final    Comment: (NOTE) The Xpert SA Assay (FDA approved for NASAL specimens in patients 40 years of age and older), is one component of a comprehensive surveillance program. It is not intended to diagnose infection nor to guide or monitor treatment. Performed at [facility], [address], [place], [place] [address]    Time coordinating discharge: 35 minutes SIGNED: Lesa Rape, MD  [facility] 06/01/2024, 10:16 AM  If 7PM-7AM, please contact night-coverage [email or web address]

## 2024-06-21 DIAGNOSIS — Z9889 Other specified postprocedural states: Secondary | ICD-10-CM | POA: Diagnosis not present

## 2024-06-21 DIAGNOSIS — S72142A Displaced intertrochanteric fracture of left femur, initial encounter for closed fracture: Secondary | ICD-10-CM | POA: Diagnosis not present

## 2024-07-26 DIAGNOSIS — S72142A Displaced intertrochanteric fracture of left femur, initial encounter for closed fracture: Secondary | ICD-10-CM | POA: Diagnosis not present

## 2024-12-09 ENCOUNTER — Ambulatory Visit: Admitting: Family Medicine

## 2024-12-09 ENCOUNTER — Telehealth: Payer: Self-pay | Admitting: Family Medicine

## 2024-12-09 VITALS — BP 137/67 | HR 65 | Temp 98.3°F | Ht 67.0 in | Wt 106.8 lb

## 2024-12-09 DIAGNOSIS — H9319 Tinnitus, unspecified ear: Secondary | ICD-10-CM | POA: Insufficient documentation

## 2024-12-09 DIAGNOSIS — Z23 Encounter for immunization: Secondary | ICD-10-CM | POA: Diagnosis not present

## 2024-12-09 DIAGNOSIS — F32A Depression, unspecified: Secondary | ICD-10-CM | POA: Insufficient documentation

## 2024-12-09 DIAGNOSIS — M545 Low back pain, unspecified: Secondary | ICD-10-CM | POA: Insufficient documentation

## 2024-12-09 DIAGNOSIS — R110 Nausea: Secondary | ICD-10-CM | POA: Diagnosis not present

## 2024-12-09 DIAGNOSIS — R6889 Other general symptoms and signs: Secondary | ICD-10-CM

## 2024-12-09 DIAGNOSIS — R634 Abnormal weight loss: Secondary | ICD-10-CM | POA: Diagnosis not present

## 2024-12-09 DIAGNOSIS — Z122 Encounter for screening for malignant neoplasm of respiratory organs: Secondary | ICD-10-CM | POA: Insufficient documentation

## 2024-12-09 DIAGNOSIS — Z951 Presence of aortocoronary bypass graft: Secondary | ICD-10-CM | POA: Insufficient documentation

## 2024-12-09 DIAGNOSIS — I6529 Occlusion and stenosis of unspecified carotid artery: Secondary | ICD-10-CM | POA: Insufficient documentation

## 2024-12-09 DIAGNOSIS — J449 Chronic obstructive pulmonary disease, unspecified: Secondary | ICD-10-CM | POA: Diagnosis not present

## 2024-12-09 DIAGNOSIS — I2510 Atherosclerotic heart disease of native coronary artery without angina pectoris: Secondary | ICD-10-CM

## 2024-12-09 DIAGNOSIS — Z953 Presence of xenogenic heart valve: Secondary | ICD-10-CM | POA: Diagnosis not present

## 2024-12-09 DIAGNOSIS — E559 Vitamin D deficiency, unspecified: Secondary | ICD-10-CM | POA: Insufficient documentation

## 2024-12-09 DIAGNOSIS — Z789 Other specified health status: Secondary | ICD-10-CM

## 2024-12-09 DIAGNOSIS — M1990 Unspecified osteoarthritis, unspecified site: Secondary | ICD-10-CM | POA: Insufficient documentation

## 2024-12-09 DIAGNOSIS — I10 Essential (primary) hypertension: Secondary | ICD-10-CM

## 2024-12-09 DIAGNOSIS — F4312 Post-traumatic stress disorder, chronic: Secondary | ICD-10-CM | POA: Insufficient documentation

## 2024-12-09 DIAGNOSIS — S31829A Unspecified open wound of left buttock, initial encounter: Secondary | ICD-10-CM | POA: Diagnosis not present

## 2024-12-09 DIAGNOSIS — M542 Cervicalgia: Secondary | ICD-10-CM | POA: Insufficient documentation

## 2024-12-09 MED ORDER — MEPILEX AG 4"X4" EX PADS: 1.0000 | MEDICATED_PAD | Freq: Every day | CUTANEOUS | 0 refills | Status: AC

## 2024-12-09 MED ORDER — ONDANSETRON 4 MG PO TBDP: 4.0000 mg | ORAL_TABLET | Freq: Three times a day (TID) | ORAL | 0 refills | Status: AC | PRN
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
## 2024-12-09 NOTE — Telephone Encounter (Signed)
 Patient signed record release form.  Form was faxed to [facility] in [place] at [phone].

## 2024-12-10 ENCOUNTER — Ambulatory Visit: Payer: Self-pay | Admitting: Family Medicine

## 2024-12-10 ENCOUNTER — Other Ambulatory Visit (HOSPITAL_COMMUNITY): Payer: Self-pay

## 2024-12-10 ENCOUNTER — Other Ambulatory Visit: Payer: Self-pay | Admitting: Family Medicine

## 2024-12-10 ENCOUNTER — Encounter: Payer: Self-pay | Admitting: Pharmacy Technician

## 2024-12-10 DIAGNOSIS — N289 Disorder of kidney and ureter, unspecified: Secondary | ICD-10-CM

## 2024-12-10 LAB — COMPREHENSIVE METABOLIC PANEL WITH GFR
ALT: 6 IU/L (ref 0–44)
AST: 14 IU/L (ref 0–40)
Albumin: 4.6 g/dL (ref 3.8–4.8)
Alkaline Phosphatase: 117 IU/L (ref 47–123)
BUN/Creatinine Ratio: 8 — ABNORMAL LOW (ref 10–24)
BUN: 13 mg/dL (ref 8–27)
Bilirubin Total: 0.6 mg/dL (ref 0.0–1.2)
CO2: 26 mmol/L (ref 20–29)
Calcium: 10 mg/dL (ref 8.6–10.2)
Chloride: 97 mmol/L (ref 96–106)
Creatinine, Ser: 1.54 mg/dL — ABNORMAL HIGH (ref 0.76–1.27)
Globulin, Total: 2.5 g/dL (ref 1.5–4.5)
Glucose: 96 mg/dL (ref 70–99)
Potassium: 3.7 mmol/L (ref 3.5–5.2)
Sodium: 137 mmol/L (ref 134–144)
Total Protein: 7.1 g/dL (ref 6.0–8.5)
eGFR: 46 mL/min/1.73 — ABNORMAL LOW (ref >59)

## 2024-12-10 LAB — CBC WITH DIFFERENTIAL/PLATELET
Basophils Absolute: 0.1 x10E3/uL (ref 0.0–0.2)
Basos: 1 %
EOS (ABSOLUTE): 0 x10E3/uL (ref 0.0–0.4)
Eos: 1 %
Hematocrit: 36 % — ABNORMAL LOW (ref 37.5–51.0)
Hemoglobin: 12.3 g/dL — ABNORMAL LOW (ref 13.0–17.7)
Immature Grans (Abs): 0 x10E3/uL (ref 0.0–0.1)
Immature Granulocytes: 0 %
Lymphocytes Absolute: 0.6 x10E3/uL — ABNORMAL LOW (ref 0.7–3.1)
Lymphs: 10 %
MCH: 30.8 pg (ref 26.6–33.0)
MCHC: 34.2 g/dL (ref 31.5–35.7)
MCV: 90 fL (ref 79–97)
Monocytes Absolute: 0.7 x10E3/uL (ref 0.1–0.9)
Monocytes: 11 %
Neutrophils Absolute: 4.7 x10E3/uL (ref 1.4–7.0)
Neutrophils: 77 %
Platelets: 141 x10E3/uL — ABNORMAL LOW (ref 150–450)
RBC: 4 x10E6/uL — ABNORMAL LOW (ref 4.14–5.80)
RDW: 13.3 % (ref 11.6–15.4)
WBC: 6.1 x10E3/uL (ref 3.4–10.8)

## 2024-12-10 LAB — SEDIMENTATION RATE: Sed Rate: 6 mm/h (ref 0–30)

## 2024-12-10 LAB — C-REACTIVE PROTEIN: CRP: <1 mg/L (ref 0–10)

## 2024-12-10 LAB — T4, FREE: Free T4: 1.45 ng/dL (ref 0.82–1.77)

## 2024-12-10 LAB — TSH: TSH: 2.79 u[IU]/mL (ref 0.450–4.500)

## 2024-12-10 LAB — HIV ANTIBODY (ROUTINE TESTING W REFLEX): HIV Screen 4th Generation wRfx: NONREACTIVE

## 2024-12-10 LAB — HEPATITIS C ANTIBODY: Hep C Virus Ab: NONREACTIVE

## 2024-12-10 NOTE — Telephone Encounter (Signed)
 error

## 2024-12-10 NOTE — Telephone Encounter (Signed)
 Received fax from [facility] requesting that I resend patients record release form to fax # [phone]. I faxed form as requested.

## 2024-12-16 ENCOUNTER — Ambulatory Visit (HOSPITAL_COMMUNITY)

## 2024-12-16 ENCOUNTER — Ambulatory Visit: Admitting: Family Medicine

## 2024-12-20 ENCOUNTER — Telehealth: Payer: Self-pay | Admitting: Family Medicine

## 2024-12-20 NOTE — Telephone Encounter (Signed)
 Copied from CRM #[id number]. Topic: General - Other >> Dec 20, 2024  8:15 AM Rosaria A wrote: Reason for CRM: Tonya nurse with [facility] states that they received home health orders from the patients PCP to go out for wound on left  buttock. They went out and the patient reused all home health services. Per [facility] the wound looks great and has a scab that is about to fall off.  Patinet is not in no immediate danger.

## 2024-12-24 ENCOUNTER — Ambulatory Visit: Admitting: Family Medicine

## 2025-01-02 ENCOUNTER — Ambulatory Visit (HOSPITAL_COMMUNITY): Admission: RE | Admit: 2025-01-02 | Source: Ambulatory Visit

## 2025-01-04 ENCOUNTER — Telehealth: Payer: Self-pay | Admitting: Family Medicine

## 2025-01-04 NOTE — Telephone Encounter (Signed)
 Called and spoke with patient to make him aware that he would need to call [facility] to get them to reschedule the CT scan that he missed on 01/02/2025. Patient says he does not want to go to [facility]. He would like to have CT scan done at [facility] in [place].

## 2025-01-04 NOTE — Telephone Encounter (Signed)
 Spoke with patient to schedule AWV.   He stated that he needs to have his CT scan rescheduled as soon as possible.  Thank you,  Corean,  AMB Clinical Support [facility] AWV Program Direct Dial ??[phone]

## 2025-01-04 NOTE — Telephone Encounter (Signed)
 If Patient would like CT done at [facility] in [place] - A signed order will need to be faxed to their Centralized Scheduling Department at [phone].  Please let me know when this has been completed so I can call the department and check to make sure they received the order and Patient has been scheduled.  Patient has HTA which does not require Prior Authorization.

## 2025-01-05 NOTE — Telephone Encounter (Signed)
 Can you provide a signed CT order please? I can fax to centralized scheduling once completed.

## 2025-02-15 ENCOUNTER — Ambulatory Visit

## 2025-04-06 ENCOUNTER — Ambulatory Visit: Admitting: Cardiology
